# Patient Record
Sex: Female | Born: 1955 | Hispanic: Yes | Marital: Married | Smoking: Never smoker
Health system: Southern US, Community
[De-identification: ages and names within clinical notes are randomized; demographics above are authoritative.]

## PROBLEM LIST (undated history)

## (undated) DIAGNOSIS — Z9221 Personal history of antineoplastic chemotherapy: Secondary | ICD-10-CM

## (undated) DIAGNOSIS — N838 Other noninflammatory disorders of ovary, fallopian tube and broad ligament: Secondary | ICD-10-CM

## (undated) DIAGNOSIS — Z923 Personal history of irradiation: Secondary | ICD-10-CM

## (undated) DIAGNOSIS — M8580 Other specified disorders of bone density and structure, unspecified site: Secondary | ICD-10-CM

## (undated) DIAGNOSIS — Z170 Estrogen receptor positive status [ER+]: Secondary | ICD-10-CM

## (undated) DIAGNOSIS — C50412 Malignant neoplasm of upper-outer quadrant of left female breast: Secondary | ICD-10-CM

## (undated) HISTORY — PX: ABDOMINAL HYSTERECTOMY: SHX81

## (undated) HISTORY — DX: Other specified disorders of bone density and structure, unspecified site: M85.80

---

## 2008-03-16 ENCOUNTER — Other Ambulatory Visit: Admission: RE | Admit: 2008-03-16 | Discharge: 2008-03-16 | Payer: Self-pay | Admitting: Gynecology

## 2009-02-18 ENCOUNTER — Encounter: Admission: RE | Admit: 2009-02-18 | Discharge: 2009-02-18 | Payer: Self-pay | Admitting: Gynecology

## 2010-10-06 ENCOUNTER — Other Ambulatory Visit
Admission: RE | Admit: 2010-10-06 | Discharge: 2010-10-06 | Payer: Self-pay | Source: Home / Self Care | Admitting: Gynecology

## 2010-10-06 ENCOUNTER — Ambulatory Visit: Payer: Self-pay | Admitting: Gynecology

## 2010-10-17 ENCOUNTER — Ambulatory Visit: Payer: Self-pay | Admitting: Gynecology

## 2010-11-03 ENCOUNTER — Ambulatory Visit: Admission: RE | Admit: 2010-11-03 | Payer: Self-pay | Source: Home / Self Care | Admitting: Gynecology

## 2010-12-01 DIAGNOSIS — Z13820 Encounter for screening for osteoporosis: Secondary | ICD-10-CM

## 2010-12-15 ENCOUNTER — Other Ambulatory Visit (INDEPENDENT_AMBULATORY_CARE_PROVIDER_SITE_OTHER): Payer: BC Managed Care – PPO

## 2010-12-15 DIAGNOSIS — E559 Vitamin D deficiency, unspecified: Secondary | ICD-10-CM

## 2011-01-11 ENCOUNTER — Other Ambulatory Visit: Payer: BC Managed Care – PPO

## 2011-12-04 ENCOUNTER — Other Ambulatory Visit: Payer: Self-pay | Admitting: Gynecology

## 2011-12-04 DIAGNOSIS — Z1231 Encounter for screening mammogram for malignant neoplasm of breast: Secondary | ICD-10-CM

## 2011-12-11 ENCOUNTER — Ambulatory Visit
Admission: RE | Admit: 2011-12-11 | Discharge: 2011-12-11 | Disposition: A | Discharge status: Home / Self Care | Payer: BC Managed Care – PPO | Source: Ambulatory Visit | Attending: Gynecology | Admitting: Gynecology

## 2011-12-11 DIAGNOSIS — Z1231 Encounter for screening mammogram for malignant neoplasm of breast: Secondary | ICD-10-CM

## 2012-02-27 ENCOUNTER — Encounter: Payer: Self-pay | Admitting: Gynecology

## 2012-02-27 ENCOUNTER — Ambulatory Visit (INDEPENDENT_AMBULATORY_CARE_PROVIDER_SITE_OTHER): Payer: BC Managed Care – PPO | Admitting: Gynecology

## 2012-02-27 VITALS — BP 110/70 | Ht 59.5 in | Wt 142.0 lb

## 2012-02-27 DIAGNOSIS — Z01419 Encounter for gynecological examination (general) (routine) without abnormal findings: Secondary | ICD-10-CM

## 2012-02-27 DIAGNOSIS — M8580 Other specified disorders of bone density and structure, unspecified site: Secondary | ICD-10-CM

## 2012-02-27 DIAGNOSIS — M899 Disorder of bone, unspecified: Secondary | ICD-10-CM

## 2012-02-27 DIAGNOSIS — Z8639 Personal history of other endocrine, nutritional and metabolic disease: Secondary | ICD-10-CM

## 2012-02-27 DIAGNOSIS — Z833 Family history of diabetes mellitus: Secondary | ICD-10-CM

## 2012-02-27 DIAGNOSIS — R635 Abnormal weight gain: Secondary | ICD-10-CM

## 2012-02-27 DIAGNOSIS — Z87898 Personal history of other specified conditions: Secondary | ICD-10-CM

## 2012-02-27 LAB — CHOLESTEROL, TOTAL: Cholesterol: 223 mg/dL — ABNORMAL HIGH (ref 0–200)

## 2012-02-27 LAB — TSH: TSH: 1.128 u[IU]/mL (ref 0.350–4.500)

## 2012-02-27 NOTE — Patient Instructions (Signed)
Recuerdate de hacer la cita para la colonoscopia. Es [employer].  Colonoscopa (Colonoscopy)   El profesional que lo asiste le ha ordenado una colonoscopa. Es un examen para evaluar el colon en su totalidad. Durante este examen se limpia el colon. Luego se inserta un tubo largo de fibras pticas en el recto y el colon. El tubo de fibras pticas (fibroscopio, endoscopio) es un largo haz de fibras unidas y [place] flexibles. Estas fibras transmiten luz hacia la zona examinada y envan las imgenes para que el profesional las observe. Las molestias son mnimas. Podrn administrarle un sedante suave que lo ayudar a dormir antes y Teacher, adult education. Este examen tambin ayuda a Engineer, manufacturing bultos (tumores), plipos, inflamacin y reas de [place]. El profesional extraer una pequea pieza de tejido (biopsia) que ser examinada en el microscopio. INFORME AL PROFESIONAL SOBRE LOS SIGUIENTES PUNTOS:  Alergias.   Medicamentos que toma, incluyendo hierbas, gotas oftlmicas, medicamentos de venta libre y cremas.   Uso de esteroides (por va oral o cremas).   Problemas anteriores debido a anestsicos.   Posibilidad de embarazo, si correspondiera.   Antecedentes de haber tenido cogulos sanguneos (tromboflebitis) o antecedentes de hemorragias o problemas sanguneos.   Cirugas previas.   Otros problemas de [place].  ANTES DEL PROCEDIMIENTO  [facility] antes del estudio deber someterse a [place]   Consulte a su mdico si debe cambiar o suspender los medicamentos que toma habitualmente.   Le requerirn que se aplique enemas o tome un purgante.   Tendr que beber una gran cantidad de solucin electroltica durante un breve perodo de [place]. Esta solucin ayuda a limpiar el colon.   Deber presentarse 60 minutos antes del procedimiento a menos que el profesional que lo asiste le indique otra cosa.  DESPUS DEL PROCEDIMIENTO  Si le administraron un sedante o un analgsico, deber  solicitar a alguna persona que lo lleve hasta su casa.   En algunos casos se observar un pequeo cogulo la primera vez que haga la deposicin. NO debe preocuparse.  AVERIGUE LOS RESULTADOS DE SU ESTUDIO Durante su visita no contar con todos los [employer]. En este caso, tenga otra entrevista con su mdico para conocerlos. No piense que el resultado es normal si no tiene noticias de su mdico o de la institucin mdica. Es Copy seguimiento de todos los [place] de [place]. INSTRUCCIONES PARA EL CUIDADO DOMICILIARIO  No es infrecuente eliminar una cantidad moderada de gases y experimentar ligeros clicos abdominales luego del procedimiento. Esto se debe al aire que se le ha insuflado en el colon durante el examen. [place] o colquese una almohadilla trmica en el abdomen (vientre).   Podr retornar a sus comidas y actividades habituales despus que se le pase el efecto de los sedantes y los medicamentos.   Utilice los medicamentos de venta libre o de prescripcin para Chief Technology Officer, Environmental health practitioner o la [place], segn se lo indique el profesional que lo asiste. NO tome aspirina o anticoagulantes si le tomaron una biopsia. Consulte al profesional que lo asiste acerca del uso de medicamentos en caso que le hayan practicado una biopsia.  SOLICITE ATENCIN MDICA DE INMEDIATO SI:  Tiene fiebre.   Elimina grandes cogulos o elimina sangre luego del procedimiento. Esto puede sucederle hasta 10 a 14 das posteriores al procedimiento. Es ms probable que ocurra si le han practicado una biopsia.   Siente dolor abdominal que empeora y no puede aliviarse con los medicamentos.  Document Released: 07/26/2005 Document  Revised: 10/05/2011 ExitCare Patient Information 2012 [place], [place].                                                    Control del colesterol  Los niveles de colesterol en el organismo estn determinados significativamente por su dieta. Los niveles de colesterol  tambin se relacionan con la enfermedad cardaca. El material que sigue ayuda a Software engineer relacin y a Chiropractor qu puede hacer para mantener su corazn sano. No todo el colesterol es [place]. Las lipoprotenas de baja densidad (LDL) forman el colesterol "malo". El colesterol malo puede ocasionar depsitos de grasa que se acumulan en el interior de las arterias. Las lipoprotenas de alta densidad (HDL) es el colesterol "bueno". Ayuda a remover el colesterol LDL "malo" de la [place]. El colesterol es un factor de riesgo muy importante para la enfermedad cardaca. Otros factores de riesgo son la hipertensin arterial, el hbito de fumar, el estrs, la herencia y [place].  El msculo cardaco obtiene el suministro de sangre a travs de las arterias coronarias. Si su colesterol LDL ("malo") est elevado y el HDL ("bueno") es bajo, tiene un factor de riesgo para que se formen depsitos de [place] representative en las arterias coronarias (los vasos sanguneos que suministran sangre al corazn). Esto hace que haya menos lugar para que la sangre circule. Sin la suficiente sangre y oxgeno, el msculo cardaco no puede funcionar correctamente, y usted podr sentir dolores en el pecho (angina pectoris). Cuando una arteria coronaria se cierra completamente, una parte del msculo cardaco puede morir (infarto de miocardio). CONTROL DEL COLESTEROL Cuando el profesional que lo asiste enva la sangre al laboratorio para Artist nivel de colesterol, puede realizarle tambin un perfil completo de los lpidos. Con esta prueba, se puede determinar la cantidad total de colesterol, as como los niveles de LDL y HDL. Los triglicridos son un tipo de grasa que circula en la sangre y que tambin puede utilizarse para determinar el riesgo de enfermedad cardaca. En la siguiente tabla se establecen los nmeros ideales: Prueba: Colesterol total  Menos de 200 mg/dl.  Prueba: LDL "colesterol malo"  Menos de 100 mg/dl.   Menos de 70 mg/dl si tiene  riesgo muy elevado de sufrir un ataque cardaco o muerte cardaca sbita.  Prueba: HDL "colesterol bueno"  Mujeres: Ms de 50 mg/dl.   Hombres: Ms de 40 mg/dl.  Prueba: Trigliceridos  Menos de 150 mg/dl.  CONTROL DEL COLESTEROL CON DIETA Aunque factores como el ejercicio y el estilo de vida son importantes, la "primera lnea de ataque" es la dieta. Esto se debe a que se sabe que ciertos alimentos hacen subir el colesterol y otros lo [place]. El objetivo debe ser [employer] alimentos, de modo que tengan un efecto sobre el colesterol y, an ms importante, Microbiologist las grasas saturadas y trans con otros tipos de grasas, como las monoinsaturadas y las poliinsaturadas y cidos grasos omega-3 . En promedio, una persona no debe consumir ms de 15 a 17 g de grasas saturadas por [employer]. Las grasas saturadas y trans se consideran grasas "malas", ya que elevan el colesterol LDL. Las grasas saturadas se encuentran principalmente en productos animales como carne, [place] y crema. Pero esto no significa que usted Marketing executive todas sus comidas favoritas. Actualmente, como lo muestra el cuadro que figura al final de [place] documento,  hay sustitutos de buen sabor, bajos en grasas y en colesterol, para la mayora de los alimentos que a usted Musician. Elija aquellos alimentos alternativos que sean bajos en grasas o sin grasas. Elija cortes de carne del cuarto trasero o lomo ya que estos cortes son los que tienen menor cantidad de grasa y Oncologist. El pollo (sin piel), el pescado, la carne de ternera, y la [place] de [place] molida son excelentes opciones. Elimine las carnes [employer] o el salami. Los [employer] o nada de grasas saturadas. Cuando consuma carne [place], carne de aves de corral, o pescado, hgalo en porciones de 85 gramos (3 onzas). Las grasas trans tambin se llaman "aceites parcialmente hidrogenados". Son aceites manipulados cientficamente de [place] que son slidos a  Publishing rights manager, tienen una larga vida y Glass blower/designer sabor y la textura de los alimentos a los que se Scientist, clinical (histocompatibility and immunogenetics). Las grasas trans se encuentran en la [place], [place], crackers y alimentos horneados.  Para hornear y cocinar, el aceite es un excelente sustituto para la [place]. Los aceites monoinsaturados tienen un beneficio particular, ya que se cree que disminuyen el colesterol LDL (colesterol malo) y elevan el HDL. Deber evitar los aceites tropicales saturados como el de coco y el de [place].  Recuerde, adems, que puede comer sin restricciones los grupos de alimentos que son naturalmente libres de grasas saturadas y Neurosurgeon trans, entre los que se incluyen el pescado, las frutas (excepto el [place]), verduras, frijoles, cereales (cebada, arroz, [place], trigo) y las pastas (sin salsas con crema)  IDENTIFIQUE LOS ALIMENTOS QUE DISMINUYEN EL COLESTEROL  Pueden disminuir el colesterol las fibras solubles que estn en las frutas, como las [place], en los vegetales como el brcoli, las patatas y las zanahorias; en las legumbres como frijoles, guisantes y Therapist, occupational; y en los cereales como la cebada. Los alimentos fortificados con fitosteroles tambin Engineer, production. Debe consumir al menos 2 g de estos alimentos a diario para Financial planner de disminucin de [place].  En el supermercado, lea las etiquetas de los envases para identificar los alimentos bajos en grasas saturadas, libres de grasas trans y bajos en [place], . Elija quesos que tengan solo de 2 a 3 g de grasa saturada por onza (28,35 g). Use una margarina que no dae el corazn, [place] de grasas trans o aceite parcialmente hidrogenado. Al comprar alimentos horneados (galletitas dulces y [place]) evite el aceite parcialmente hidrogenado. Los panes y bollos debern ser de granos enteros (harina de maz o de avena entera, en lugar de "harina" o "harina enriquecida"). Compre sopas en lata que no sean cremosas, con bajo contenido de  sal y sin grasas adicionadas.  TCNICAS DE PREPARACIN DE LOS ALIMENTOS  Nunca fra los alimentos en aceite abundante. Si debe frer, hgalo en poco aceite y removiendo [place], porque as se utilizan muy pocas grasas, o utilice un spray antiadherente. Cuando le sea posible, hierva, hornee o ase las carnes y cocine los vegetales al vapor. En vez de [employer] con mantequilla o [place], utilice limn y hierbas, pur de Psychologist, educational y canela (para las calabazas y batatas), yogurt y salsa descremados y aderezos para ensaladas bajos en contenido graso.  BAJO EN GRASAS SATURADAS / SUSTITUTOS BAJOS EN GRASA  Carnes / Grasas saturadas (g)  Evite: Bife, corte graso (3 oz/85 g) / 11 g   Elija: Bife, corte magro (3 oz/85 g) / 4 g   Evite: Hamburguesa (3 oz/85 g) / 7 g  Elija:  [place] magra (3 oz/85 g) / 5 g   Evite: Jamn (3 oz/85 g) / 6 g   Elija:  Jamn magro (3 oz/85 g) / 2.4 g   Evite: Pollo, con piel (3 oz/85 g), Carne oscura / 4 g   Elija:  Pollo, sin piel (3 oz/85 g), Carne oscura / 2 g   Evite: Pollo, con piel (3 oz/85 g), Carne magra / 2.5 g   Elija: Pollo, sin piel (3 oz/85 g), Carne magra / 1 g  Lcteos / Grasas saturadas (g)  Evite: Leche entera (1 taza) / 5 g   Elija: Leche con bajo contenido de grasa, 2% (1 taza) / 3 g   Elija: Leche con bajo contenido de grasa, 1% (1 taza) / 1.5 g   Elija: Leche descremada (1 taza) / 0.3 g   Evite: Queso duro (1 oz/28 g) / 6 g   Elija: Queso descremado (1 oz/28 g) / 2-3 g   Evite: Queso cottage, 4% grasa (1 taza)/ 6.5 g   Elija: Queso cottage con bajo contenido de grasa, 1% grasa (1 taza)/ 1.5 g   Evite: Helado (1 taza) / 9 g   Elija: Sorbete (1 taza) / 2.5 g   Elija: Yogurt helado sin contenido de grasa (1 taza) / 0.3 g   Elija: Barras de fruta congeladas / vestigios   Evite: Crema batida (1 cucharada) / 3.5 g   Elija: Batidos glac sin lcteos (1 cucharada) / 1 g  Condimentos / Grasas saturadas  (g)  Evite: Mayonesa (1 cucharada) / 2 g   Elija: Mayonesa con bajo contenido de grasa (1 cucharada) / 1 g   Evite: [place] (1 cucharada) / 7 g   Elija: Margarina extra light (1 cucharada) / 1 g   Evite: Aceite de coco (1 cucharada) / 11.8 g   Elija: Aceite de oliva (1 cucharada) / 1.8 g   Elija: Aceite de maz (1 cucharada) / 1.7 g   Elija: Aceite de crtamo (1 cucharada) / 1.2 g   Elija: Aceite de girasol (1 cucharada) / 1.4 g   Elija: Aceite de soja (1 cucharada) / 2.4 g   Elija: Aceite de canola (1 cucharada) / 1 g  Document Released: 10/16/2005 Document Revised: 06/28/2011 [facility] Patient Information 2012 [place], [place].  Ejercicios para perder peso (Exercise to Lose Weight) La actividad fsica y [place] Dear dieta saludable ayudan a perder peso. El mdico podr sugerirle ejercicios especficos. IDEAS Y CONSEJOS PARA HACER EJERCICIOS  Elija opciones econmicas que disfrute hacer , como caminar, andar en bicicleta o los vdeos para ejercitarse.   Utilice las Microbiologist del ascensor.   Camine durante la hora del almuerzo.   Estacione el auto lejos del lugar de [place] o [place].   Concurra a un gimnasio o tome clases de gimnasia.   Comience con 5  10 minutos de actividad fsica por da. Ejercite hasta 30 minutos, 4 a 6 das por [address]   Utilice zapatos que tengan un buen soporte y ropas cmodas.   Elongue antes y despus de Company secretary.   Ejercite hasta que aumente la respiracin y el corazn palpite rpido.   Beba agua extra cuando ejercite.   No haga ejercicio Firefighter, sentirse mareado o que le falte mucho el aire.  La actividad fsica puede quemar alrededor de 150 caloras.  Correr 20 cuadras en 15 minutos.   Jugar vley durante 45 a 60 minutos.   Limpiar y encerar el auto durante 45 a  60 minutos.   Jugar ftbol americano de toque.   Caminar 25 cuadras en 35 minutos.   Empujar un cochecito 20 cuadras en 30 minutos.   Jugar baloncesto  durante 30 minutos.   Rastrillar hojas secas durante 30 minutos.   Andar en bicicleta 80 cuadras en 30 minutos.   Caminar 30 cuadras en 30 minutos.   Bailar durante 30 minutos.   Quitar la nieve con una pala durante 15 minutos.   Nadar vigorosamente durante 20 minutos.   Subir escaleras durante 15 minutos.   Andar en bicicleta 60 cuadras durante 15 minutos.   Arreglar el jardn entre 30 y 45 minutos.   Saltar a la soga durante 15 minutos.   Limpiar vidrios o pisos durante 45 a 60 minutos.  Document Released: 01/20/2011 Document Revised: 06/28/2011 [facility] Patient Information 2012 [place], [place].

## 2012-02-27 NOTE — Progress Notes (Signed)
Heather [name] [birth year] [id number]   History:    56 y.o.  for annual exam with no complaints. Review of her record indicated she's [place] abdominal [place]. Her last mammogram was in favor 2013 which was normal and she frequently does her self breast examination. She had a bone density study in 2012 with decreased bone mineralization noted at the AP spine with a T score of -2.0. Last year she had vitamin D deficiency and she's currently taking calcium vitamin D daily and is active. She has had not had a colonoscopy.  Past medical history,surgical history, family history and social history were all reviewed and documented in the EPIC chart.  Gynecologic History No LMP recorded. Patient has had a hysterectomy. Contraception: none Last Pap: 2012. Results were: normal Last mammogram: 2013. Results were: normal  Obstetric History OB History    Grav Para Term Preterm Abortions TAB SAB Ect Mult Living   2 2 2       2      # Outc Date GA Lbr Len/2nd Wgt Sex Del Anes PTL Lv   1 TRM     F SVD  No Yes   2 TRM     F CS  No Yes       ROS:  Was performed and pertinent positives and negatives are included in the history.  Exam: chaperone present  BP 110/70  Ht 4' 11.5" (1.511 m)  Wt 142 lb (64.411 kg)  BMI 28.20 kg/m2  Body mass index is 28.20 kg/(m^2).  General appearance : Well developed well nourished female. No acute distress HEENT: Neck supple, trachea midline, no carotid bruits, no thyroidmegaly Lungs: Clear to auscultation, no rhonchi or wheezes, or rib retractions  Heart: Regular rate and rhythm, no murmurs or gallops Breast:Examined in sitting and supine position were symmetrical in appearance, no palpable masses or tenderness,  no skin retraction, no nipple inversion, no nipple discharge, no skin discoloration, no axillary or supraclavicular lymphadenopathy Abdomen: no palpable masses or tenderness, no rebound or guarding Extremities: no edema  or skin discoloration or tenderness  Pelvic:  [place], Urethra, Skene Glands: Within normal limits             Vagina: No gross lesions or discharge  Cervix: Absent Uterus absent  Adnexa  Without masses or tenderness  Anus and perineum  normal   Rectovaginal  normal sphincter tone without palpated masses or tenderness             Hemoccult cards presented to the patient to submit to the office for testing. Digital rectal exam unremarkable.     Assessment/Plan:  56 y.o. female for annual exam once again reminded to have her screening colonoscopy. And then the gastroenterologist provided. She was instructed to continue her monthly self breast examination. She will submit to the office the Hemoccult card system it to the office for testing. The following labs will be drawn today: CBC, cholesterol, hemoglobin A1c (brother with history of non-insulin-dependent diabetes), TSH, along with a urinalysis. New Pap smear screening guidelines discussed she has no prior history of abnormal Pap smears and has had a hysterectomy so she will no longer needs Pap smears. All the above was discussed with this patient in Spanish all questions were answered we'll follow accordingly. Of note literature information on exercise and cholesterol lowering diet was provided.   Ok Edwards MD, 4:46 PM 02/27/2012

## 2012-02-28 ENCOUNTER — Other Ambulatory Visit: Payer: Self-pay | Admitting: *Deleted

## 2012-02-28 DIAGNOSIS — E7800 Pure hypercholesterolemia, unspecified: Secondary | ICD-10-CM

## 2012-02-28 LAB — URINALYSIS W MICROSCOPIC + REFLEX CULTURE
Bacteria, UA: NONE SEEN
Bilirubin Urine: NEGATIVE
Casts: NONE SEEN
Crystals: NONE SEEN
Glucose, UA: NEGATIVE mg/dL
Hgb urine dipstick: NEGATIVE
Ketones, ur: NEGATIVE mg/dL
Leukocytes, UA: NEGATIVE
Nitrite: NEGATIVE
Protein, ur: NEGATIVE mg/dL
Specific Gravity, Urine: 1.005 (ref 1.005–1.030)
Squamous Epithelial / LPF: NONE SEEN
Urobilinogen, UA: 0.2 mg/dL (ref 0.0–1.0)
pH: 7 (ref 5.0–8.0)

## 2012-02-28 LAB — CBC WITH DIFFERENTIAL/PLATELET
Basophils Absolute: 0 10*3/uL (ref 0.0–0.1)
Basophils Relative: 0 % (ref 0–1)
Eosinophils Absolute: 0 10*3/uL (ref 0.0–0.7)
Eosinophils Relative: 1 % (ref 0–5)
HCT: 41.9 % (ref 36.0–46.0)
Hemoglobin: 14.5 g/dL (ref 12.0–15.0)
Lymphocytes Relative: 34 % (ref 12–46)
Lymphs Abs: 2 10*3/uL (ref 0.7–4.0)
MCH: 30.5 pg (ref 26.0–34.0)
MCHC: 34.6 g/dL (ref 30.0–36.0)
MCV: 88 fL (ref 78.0–100.0)
Monocytes Absolute: 0.4 10*3/uL (ref 0.1–1.0)
Monocytes Relative: 6 % (ref 3–12)
Neutro Abs: 3.5 10*3/uL (ref 1.7–7.7)
Neutrophils Relative %: 59 % (ref 43–77)
Platelets: 231 10*3/uL (ref 150–400)
RBC: 4.76 MIL/uL (ref 3.87–5.11)
RDW: 13.5 % (ref 11.5–15.5)
WBC: 5.9 10*3/uL (ref 4.0–10.5)

## 2012-02-28 LAB — HEMOGLOBIN A1C
Hgb A1c MFr Bld: 5.6 % (ref <5.7)
Mean Plasma Glucose: 114 mg/dL (ref <117)

## 2012-02-28 LAB — VITAMIN D 25 HYDROXY (VIT D DEFICIENCY, FRACTURES): Vit D, 25-Hydroxy: 42 ng/mL (ref 30–89)

## 2012-03-07 ENCOUNTER — Other Ambulatory Visit: Payer: BC Managed Care – PPO

## 2012-03-07 DIAGNOSIS — E7800 Pure hypercholesterolemia, unspecified: Secondary | ICD-10-CM

## 2012-03-07 LAB — LIPID PANEL
Cholesterol: 209 mg/dL — ABNORMAL HIGH (ref 0–200)
HDL: 50 mg/dL (ref >39)
LDL Cholesterol: 141 mg/dL — ABNORMAL HIGH (ref 0–99)
Total CHOL/HDL Ratio: 4.2 Ratio
Triglycerides: 89 mg/dL (ref <150)
VLDL: 18 mg/dL (ref 0–40)

## 2012-03-11 ENCOUNTER — Other Ambulatory Visit: Payer: Self-pay | Admitting: *Deleted

## 2012-03-11 DIAGNOSIS — E7800 Pure hypercholesterolemia, unspecified: Secondary | ICD-10-CM

## 2012-03-12 ENCOUNTER — Other Ambulatory Visit: Payer: Self-pay | Admitting: *Deleted

## 2012-03-12 ENCOUNTER — Other Ambulatory Visit: Payer: Self-pay | Admitting: Gynecology

## 2012-03-12 DIAGNOSIS — Z1211 Encounter for screening for malignant neoplasm of colon: Secondary | ICD-10-CM

## 2013-04-14 ENCOUNTER — Encounter: Payer: Self-pay | Admitting: Gynecology

## 2013-04-14 ENCOUNTER — Ambulatory Visit (INDEPENDENT_AMBULATORY_CARE_PROVIDER_SITE_OTHER): Payer: BC Managed Care – PPO | Admitting: Gynecology

## 2013-04-14 VITALS — BP 118/76 | Ht 59.75 in | Wt 139.0 lb

## 2013-04-14 DIAGNOSIS — Z1159 Encounter for screening for other viral diseases: Secondary | ICD-10-CM

## 2013-04-14 DIAGNOSIS — Z01419 Encounter for gynecological examination (general) (routine) without abnormal findings: Secondary | ICD-10-CM

## 2013-04-14 DIAGNOSIS — M899 Disorder of bone, unspecified: Secondary | ICD-10-CM

## 2013-04-14 DIAGNOSIS — N951 Menopausal and female climacteric states: Secondary | ICD-10-CM

## 2013-04-14 DIAGNOSIS — M8580 Other specified disorders of bone density and structure, unspecified site: Secondary | ICD-10-CM

## 2013-04-14 DIAGNOSIS — Z8639 Personal history of other endocrine, nutritional and metabolic disease: Secondary | ICD-10-CM

## 2013-04-14 LAB — CHOLESTEROL, TOTAL: Cholesterol: 186 mg/dL (ref 0–200)

## 2013-04-14 LAB — HEPATITIS C ANTIBODY: HCV Ab: NEGATIVE

## 2013-04-14 NOTE — Patient Instructions (Addendum)
Colonoscopa (Colonoscopy) El profesional que lo asiste le ha ordenado una colonoscopa. Es un examen para evaluar el colon en su totalidad. Durante este examen se limpia el colon. Luego se inserta un tubo largo de fibras pticas en el recto y el colon. El tubo de fibras pticas (fibroscopio, endoscopio) es un largo haz de fibras unidas y [place] flexibles. Estas fibras transmiten luz hacia la zona examinada y envan las imgenes para que el profesional las observe. Las molestias son mnimas. Podrn administrarle un sedante suave que lo ayudar a dormir antes y Teacher, adult education. Este examen tambin ayuda a Engineer, manufacturing bultos (tumores), plipos, inflamacin y reas de [place]. El profesional extraer una pequea pieza de tejido (biopsia) que ser examinada en el microscopio. INFORME AL PROFESIONAL SOBRE LOS SIGUIENTES PUNTOS:  Alergias.  Medicamentos que toma, incluyendo hierbas, gotas oftlmicas, medicamentos de venta libre y cremas.  Uso de esteroides (por va oral o cremas).  Problemas anteriores debido a anestsicos.  Posibilidad de embarazo, si correspondiera.  Antecedentes de haber tenido cogulos sanguneos (tromboflebitis) o antecedentes de hemorragias o problemas sanguneos.  Cirugas previas.  Otros problemas de [place]. ANTES DEL PROCEDIMIENTO  [facility] antes del estudio deber someterse a [place]  Consulte a su mdico si debe cambiar o suspender los medicamentos que toma habitualmente.  Le requerirn que se aplique enemas o tome un purgante.  Tendr que beber una gran cantidad de solucin electroltica durante un breve perodo de [place]. Esta solucin ayuda a limpiar el colon.  Deber presentarse 60 minutos antes del procedimiento a menos que el profesional que lo asiste le indique otra cosa. DESPUS DEL PROCEDIMIENTO  Si le administraron un sedante o un analgsico, deber solicitar a alguna persona que lo lleve hasta su casa.  En algunos casos se observar un  pequeo cogulo la primera vez que haga la deposicin. NO debe preocuparse. AVERIGUE LOS RESULTADOS DE SU ESTUDIO Durante su visita no contar con todos los [employer]. En este caso, tenga otra entrevista con su mdico para conocerlos. No piense que el resultado es normal si no tiene noticias de su mdico o de la institucin mdica. Es Copy seguimiento de todos los [place] de [place]. INSTRUCCIONES PARA EL CUIDADO DOMICILIARIO  No es infrecuente eliminar una cantidad moderada de gases y experimentar ligeros clicos abdominales luego del procedimiento. Esto se debe al aire que se le ha insuflado en el colon durante el examen. Camine o colquese una almohadilla trmica en el abdomen (vientre).  Podr retornar a sus comidas y actividades habituales despus que se le pase el efecto de los sedantes y los medicamentos.  Utilice los medicamentos de venta libre o de prescripcin para Chief Technology Officer, Environmental health practitioner o la [place], segn se lo indique el profesional que lo asiste. NO tome aspirina o anticoagulantes si le tomaron una biopsia. Consulte al profesional que lo asiste acerca del uso de medicamentos en caso que le hayan practicado una biopsia. SOLICITE ATENCIN MDICA DE INMEDIATO SI:  Tiene fiebre.  Elimina grandes cogulos o elimina sangre luego del procedimiento. Esto puede sucederle hasta 10 a 14 das posteriores al procedimiento. Es ms probable que ocurra si le han practicado una biopsia.  Siente dolor abdominal que empeora y no puede aliviarse con los medicamentos. Document Released: 07/26/2005 Document Revised: 01/08/2012 [facility] Patient Information 2014 [place], [place]. Hep c screen

## 2013-04-14 NOTE — Progress Notes (Signed)
Heather [name] 28-Nov-1955 [id number]   History:    57 y.o.  for annual gyn exam no complaints today. As of this date patient has not had her colonoscopy. Her last bone density study was in 2012 which demonstrated her lowest T score was the AP spine with a value of -2.0. Patient has had history in the past and vitamin D deficiency. Patient is currently taking calcium and vitamin D. Patient mammogram was in 2013 reports be normal. Patient with no prior history of abnormal Pap smears. Her recent labs were drawn by her PCP. Patient has never been on hormone replacement therapy and has never suffered for many vasomotor symptoms. Her Hemoccult blood testing 2013 was normal.  Past medical history,surgical history, family history and social history were all reviewed and documented in the EPIC chart.  Gynecologic History No LMP recorded. Patient has had a hysterectomy. Contraception: post menopausal status Last Pap: 2011. Results were: normal Last mammogram: 2013. Results were: normal  Obstetric History OB History   Grav Para Term Preterm Abortions TAB SAB Ect Mult Living   2 2 2       2      # Outc Date GA Lbr Len/2nd Wgt Sex Del Anes PTL Lv   1 TRM     F SVD  No Yes   2 TRM     F CS  No Yes       ROS: A ROS was performed and pertinent positives and negatives are included in the history.  GENERAL: No fevers or chills. HEENT: No change in vision, no earache, sore throat or sinus congestion. NECK: No pain or stiffness. CARDIOVASCULAR: No chest pain or pressure. No palpitations. PULMONARY: No shortness of breath, cough or wheeze. GASTROINTESTINAL: No abdominal pain, nausea, vomiting or diarrhea, melena or bright red blood per rectum. GENITOURINARY: No urinary frequency, urgency, hesitancy or dysuria. MUSCULOSKELETAL: No joint or muscle pain, no back pain, no recent trauma. DERMATOLOGIC: No rash, no itching, no lesions. ENDOCRINE: No polyuria, polydipsia, no heat or cold intolerance. No recent change in  weight. HEMATOLOGICAL: No anemia or easy bruising or bleeding. NEUROLOGIC: No headache, seizures, numbness, tingling or weakness. PSYCHIATRIC: No depression, no loss of interest in normal activity or change in sleep pattern.     Exam: chaperone present  BP 118/76  Ht 4' 11.75" (1.518 m)  Wt 139 lb (63.05 kg)  BMI 27.36 kg/m2  Body mass index is 27.36 kg/(m^2).  General appearance : Well developed well nourished female. No acute distress HEENT: Neck supple, trachea midline, no carotid bruits, no thyroidmegaly Lungs: Clear to auscultation, no rhonchi or wheezes, or rib retractions  Heart: Regular rate and rhythm, no murmurs or gallops Breast:Examined in sitting and supine position were symmetrical in appearance, no palpable masses or tenderness,  no skin retraction, no nipple inversion, no nipple discharge, no skin discoloration, no axillary or supraclavicular lymphadenopathy Abdomen: no palpable masses or tenderness, no rebound or guarding Extremities: no edema or skin discoloration or tenderness  Pelvic:  Bartholin, Urethra, Skene Glands: Within normal limits             Vagina: No gross lesions or discharge  Cervix: absent  Uterus  Absent  Adnexa  Without masses or tenderness  Anus and perineum  normal   Rectovaginal  normal sphincter tone without palpated masses or tenderness             Hemoccult card provided   New CDC guidelines is recommending patients be tested once in her  lifetime for hepatitis C antibody who were born between [birth year] through 1964. This was discussed with the patient today and has agreed to be tested today.  Assessment/Plan:  57 y.o. female for annual exam was counseled once again on the importance of her screening colonoscopy. She is to submit to the office the vocal cords for testing. A screening cholesterol, vitamin D and hepatitis C were obtained today. No Pap smear was done today. The Lungs were discussed. Patient was scheduled for bone density study as  well as her mammogram. All the above instructions were provided in Spanish.    Ok Edwards MD, 1:50 PM 04/14/2013

## 2013-04-15 LAB — VITAMIN D 25 HYDROXY (VIT D DEFICIENCY, FRACTURES): Vit D, 25-Hydroxy: 38 ng/mL (ref 30–89)

## 2013-04-22 ENCOUNTER — Encounter: Payer: Self-pay | Admitting: Gynecology

## 2013-04-24 ENCOUNTER — Encounter: Payer: Self-pay | Admitting: Gynecology

## 2013-05-21 ENCOUNTER — Other Ambulatory Visit: Payer: Self-pay | Admitting: Anesthesiology

## 2013-05-21 DIAGNOSIS — Z1211 Encounter for screening for malignant neoplasm of colon: Secondary | ICD-10-CM

## 2014-01-08 ENCOUNTER — Other Ambulatory Visit: Payer: Self-pay

## 2014-01-08 DIAGNOSIS — Z1231 Encounter for screening mammogram for malignant neoplasm of breast: Secondary | ICD-10-CM

## 2014-01-22 ENCOUNTER — Ambulatory Visit
Admission: RE | Admit: 2014-01-22 | Discharge: 2014-01-22 | Disposition: A | Discharge status: Home / Self Care | Payer: Self-pay | Source: Ambulatory Visit

## 2014-01-22 DIAGNOSIS — Z1231 Encounter for screening mammogram for malignant neoplasm of breast: Secondary | ICD-10-CM

## 2014-01-23 ENCOUNTER — Other Ambulatory Visit: Payer: Self-pay | Admitting: Gynecology

## 2014-01-23 DIAGNOSIS — R928 Other abnormal and inconclusive findings on diagnostic imaging of breast: Secondary | ICD-10-CM

## 2014-02-09 ENCOUNTER — Ambulatory Visit: Payer: BC Managed Care – PPO

## 2014-02-19 ENCOUNTER — Encounter (INDEPENDENT_AMBULATORY_CARE_PROVIDER_SITE_OTHER): Payer: Self-pay

## 2014-02-19 ENCOUNTER — Ambulatory Visit
Admission: RE | Admit: 2014-02-19 | Discharge: 2014-02-19 | Disposition: A | Discharge status: Home / Self Care | Payer: BC Managed Care – PPO | Source: Ambulatory Visit | Attending: Gynecology | Admitting: Gynecology

## 2014-02-19 DIAGNOSIS — R928 Other abnormal and inconclusive findings on diagnostic imaging of breast: Secondary | ICD-10-CM

## 2014-05-12 ENCOUNTER — Encounter: Payer: Self-pay | Admitting: Gynecology

## 2014-05-18 ENCOUNTER — Encounter: Payer: Self-pay | Admitting: Gynecology

## 2014-08-31 ENCOUNTER — Encounter: Payer: Self-pay | Admitting: Gynecology

## 2015-01-13 ENCOUNTER — Ambulatory Visit (INDEPENDENT_AMBULATORY_CARE_PROVIDER_SITE_OTHER): Payer: BLUE CROSS/BLUE SHIELD | Admitting: Gynecology

## 2015-01-13 ENCOUNTER — Encounter: Payer: Self-pay | Admitting: Gynecology

## 2015-01-13 VITALS — BP 130/80 | Ht 60.0 in | Wt 141.0 lb

## 2015-01-13 DIAGNOSIS — Z01419 Encounter for gynecological examination (general) (routine) without abnormal findings: Secondary | ICD-10-CM

## 2015-01-13 DIAGNOSIS — Z780 Asymptomatic menopausal state: Secondary | ICD-10-CM | POA: Diagnosis not present

## 2015-01-13 DIAGNOSIS — M8580 Other specified disorders of bone density and structure, unspecified site: Secondary | ICD-10-CM | POA: Diagnosis not present

## 2015-01-13 DIAGNOSIS — Z8639 Personal history of other endocrine, nutritional and metabolic disease: Secondary | ICD-10-CM

## 2015-01-13 NOTE — Patient Instructions (Signed)
Osteoporosis  (Osteoporosis)  A lo largo de la vida, el cuerpo elimina el tejido viejo de los [place] y lo reemplaza por tejido nuevo. A medida que se envejece, el cuerpo no puede reponerlo tan rpidamente como lo elimina. [employer] 30 aos, la mayora de las personas comienza a perder poco a poco el tejido de los huesos debido al desequilibrio entre la prdida y la reposicin. Algunas personas pierden ms tejido [employer]. La prdida del tejido del hueso ms all de un grado normal se considera osteoporosis.  La osteoporosis afecta la resistencia y la durabilidad de los [place]. El interior de los extremos de los huesos y los huesos planos, como los huesos de la pelvis, se parecen a un panal porque estn llenos de pequeos espacios abiertos. Al perder tejido los huesos se vuelven menos densos. Esto significa que los espacios abiertos se hacen ms grandes y las paredes entre estos espacios se vuelven ms delgadas. Como consecuencia, los huesos se vuelven ms dbiles. Los huesos de una persona que sufre osteoporosis llegan a ser tan dbiles que pueden romperse (fractura) en un accidente leve, como una simple cada.  CAUSAS  Los siguientes factores han sido asociados con el desarrollo de la osteoporosis.   El hbito de fumar.  Beber ms de 2 medidas de bebidas [employer] a la [place].  Uso de ciertos medicamentos durante un tiempo prolongado.  Corticoides.  Medicamentos para la quimioterapia.  Medicamentos para la tiroides.  Medicamentos antiepilpticos.  Medicamentos de supresin gonadal.  Medicamentos inmunosupresores.  Tener bajo peso.  Falta de actividad fsica.  Falta de exposicin al sol. Esto puede ser la causa del dficit de vitamina D.  Ciertas enfermedades crnicas.  Ciertas enfermedades inflamatorias del intestino, como la enfermedad de Crohn y la colitis ulcerosa.  Diabetes.  Hipertiroidismo.  Hiperparatiroidismo. [place] desarrollar osteoporosis. Sin embargo, los siguientes factores pueden aumentar el riesgo de Actor osteoporosis.   [place]nero - La mujeres tienen ms [employer].  Edad - Tener ms de 9 aos aumenta el riesgo.  [place] y asitica tienen ms riesgo.  El peso - Tener un peso extremadamente bajo puede aumentar el riesgo de osteoporosis.  Historia familiar de osteoporosis - Tener un miembro en la familia que haya sufrido osteoporosis hace que aumente el [place] que sufren osteoporosis no tienen sntomas.  DIAGNSTICO  Algunos signos hallados durante un examen fsico que pueden hacer sospechar al mdico que sufre osteoporosis son:   Disminucin de Agricultural consultant. La causa en general es la compresin de los huesos que forman la columna vertebral (vrtebras), que se han debilitado y se han fracturado.  Una curva o redondeo de la espalda (cifosis). Para confirmar los signos de osteoporosis, el mdico puede solicitar un procedimiento que [place] 2 haces de rayos X en dosis bajas, con diferentes niveles de energa para medir la densidad mineral sea (absorciometra de rayos X de energa dual [DXA]). Adems, el mdico controlar su nivel de vitamina D.  TRATAMIENTO  El objetivo del tratamiento de la osteoporosis es el fortalecimiento de los huesos con el fin de disminuir el riesgo de fracturas. Hay diferentes tipos de medicamentos disponibles para ayudar a Geneticist, molecular. Algunos de estos medicamentos actan reduciendo [employer] de prdida sea. Otros medicamentos funcionan aumentando la densidad sea. El tratamiento tambin consiste en asegurarse de que sus niveles de calcio y vitamina D son adecuados.  PREVENCIN  Hay cosas que usted puede hacer para prevenir la osteoporosis. Un consumo adecuado de calcio y vitamina D puede ayudar a Scientist, forensic una ptima densidad mineral sea. El ejercicio regular tambin puede  ayudar, sobre todo la resistencia y actividades de alto impacto. Si usted fuma, dejar de fumar es una parte importante de la prevencin de la osteoporosis.  ASEGRESE DE QUE:   Comprende estas instrucciones.  Controlar su enfermedad.  Solicitar ayuda de inmediato si no mejora o si empeora. PARA OBTENER MS INFORMACIN  www.osteo.org and [email or web address].com.ee  Document Released: 07/26/2005 Document Revised: 02/10/2013 [facility] Patient Information 2015 [place], [place]. This information is not intended to replace advice given to you by your health care provider. Make sure you discuss any questions you have with your health care provider.

## 2015-01-13 NOTE — Progress Notes (Signed)
Heather [name] 1956-08-04 [id number]   History:    60 y.o.  for annual gyn exam who has not been seen the office since 2014. In 2014 Dr. Collene Mares did a colonoscopy were by benign polyps were removed. Review of her record indicated her last bone density study was in 2012 the lowest T score was at the AP spine with a value of -2.0. 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 Patient does have past history vitamin D deficiency. She states she is currently taking calcium and vitamin D. Patient prior to her hysterectomy reports no abnormal Pap smears. Dr. Karle Starch is her PCP who is been doing her blood work. She has never been on any hormone replacement therapy and is currently suffering from no vasomotor symptoms.  Past medical history,surgical history, family history and social history were all reviewed and documented in the EPIC chart.  Gynecologic History No LMP recorded. Patient has had a hysterectomy. Contraception: status post hysterectomy Last Pap: 2011. Results were: normal Last mammogram: 2015. Results were: normal  Obstetric History OB History  Gravida Para Term Preterm AB SAB TAB Ectopic Multiple Living  2 2 2       2     # Outcome Date GA Lbr Len/2nd Weight Sex Delivery Anes PTL Lv  2 Term     F CS-Unspec  N Y  1 Term     F Vag-Spont  N Y       ROS: A ROS was performed and pertinent positives and negatives are included in the history.  GENERAL: No fevers or chills. HEENT: No change in vision, no earache, sore throat or sinus congestion. NECK: No pain or stiffness. CARDIOVASCULAR: No chest pain or pressure. No palpitations. PULMONARY: No shortness of breath, cough or wheeze. GASTROINTESTINAL: No abdominal pain, nausea, vomiting or diarrhea, melena or bright red blood per rectum. GENITOURINARY: No urinary frequency, urgency, hesitancy or dysuria. MUSCULOSKELETAL: No joint or muscle pain, no back pain, no recent  trauma. DERMATOLOGIC: No rash, no itching, no lesions. ENDOCRINE: No polyuria, polydipsia, no heat or cold intolerance. No recent change in weight. HEMATOLOGICAL: No anemia or easy bruising or bleeding. NEUROLOGIC: No headache, seizures, numbness, tingling or weakness. PSYCHIATRIC: No depression, no loss of interest in normal activity or change in sleep pattern.     Exam: chaperone present  BP 130/80 mmHg  Ht 5' (1.524 m)  Wt 141 lb (63.957 kg)  BMI 27.54 kg/m2  Body mass index is 27.54 kg/(m^2).  General appearance : Well developed well nourished female. No acute distress HEENT: Eyes: no retinal hemorrhage or exudates,  Neck supple, trachea midline, no carotid bruits, no thyroidmegaly Lungs: Clear to auscultation, no rhonchi or wheezes, or rib retractions  Heart: Regular rate and rhythm, no murmurs or gallops Breast:Examined in sitting and supine position were symmetrical in appearance, no palpable masses or tenderness,  no skin retraction, no nipple inversion, no nipple discharge, no skin discoloration, no axillary or supraclavicular lymphadenopathy Abdomen: no palpable masses or tenderness, no rebound or guarding Extremities: no edema or skin discoloration or tenderness  Pelvic:  Bartholin, Urethra, Skene Glands: Within normal limits             Vagina: No gross lesions or discharge  Cervix: Absent  Uterus  absent  Adnexa  Without masses or tenderness  Anus and perineum  normal   Rectovaginal  normal sphincter tone without palpated masses or tenderness  Hemoccult PCP will provide     Assessment/Plan:  59 y.o. female for annual exam who is due for bone density study. She was provided with a requisition to schedule here in the office in the next few weeks. Patient was reminded once again the importance of calcium vitamin D and weightbearing exercises for osteoporosis prevention. Patient's vaccines are up-to-date. PCP is been doing her blood work. Pap smear no longer  needed according to new guidelines. Patient was reminded to schedule her mammogram next month.   Terrance Mass MD, 5:18 PM 01/13/2015

## 2015-01-26 ENCOUNTER — Ambulatory Visit (INDEPENDENT_AMBULATORY_CARE_PROVIDER_SITE_OTHER): Payer: BLUE CROSS/BLUE SHIELD

## 2015-01-26 DIAGNOSIS — Z780 Asymptomatic menopausal state: Secondary | ICD-10-CM

## 2015-01-26 DIAGNOSIS — Z8639 Personal history of other endocrine, nutritional and metabolic disease: Secondary | ICD-10-CM

## 2015-01-26 DIAGNOSIS — M8580 Other specified disorders of bone density and structure, unspecified site: Secondary | ICD-10-CM | POA: Diagnosis not present

## 2015-09-27 ENCOUNTER — Other Ambulatory Visit: Payer: Self-pay

## 2015-09-27 DIAGNOSIS — Z1231 Encounter for screening mammogram for malignant neoplasm of breast: Secondary | ICD-10-CM

## 2015-10-19 ENCOUNTER — Ambulatory Visit
Admission: RE | Admit: 2015-10-19 | Discharge: 2015-10-19 | Disposition: A | Discharge status: Home / Self Care | Payer: BLUE CROSS/BLUE SHIELD | Source: Ambulatory Visit

## 2015-10-19 DIAGNOSIS — Z1231 Encounter for screening mammogram for malignant neoplasm of breast: Secondary | ICD-10-CM

## 2015-10-20 ENCOUNTER — Other Ambulatory Visit: Payer: Self-pay | Admitting: Gynecology

## 2015-10-20 DIAGNOSIS — R928 Other abnormal and inconclusive findings on diagnostic imaging of breast: Secondary | ICD-10-CM

## 2015-10-27 ENCOUNTER — Ambulatory Visit
Admission: RE | Admit: 2015-10-27 | Discharge: 2015-10-27 | Disposition: A | Discharge status: Home / Self Care | Payer: BLUE CROSS/BLUE SHIELD | Source: Ambulatory Visit | Attending: Gynecology | Admitting: Gynecology

## 2015-10-27 ENCOUNTER — Other Ambulatory Visit: Payer: Self-pay | Admitting: Gynecology

## 2015-10-27 DIAGNOSIS — R928 Other abnormal and inconclusive findings on diagnostic imaging of breast: Secondary | ICD-10-CM

## 2015-10-31 DIAGNOSIS — C50919 Malignant neoplasm of unspecified site of unspecified female breast: Secondary | ICD-10-CM

## 2015-10-31 HISTORY — PX: BREAST LUMPECTOMY: SHX2

## 2015-10-31 HISTORY — DX: Malignant neoplasm of unspecified site of unspecified female breast: C50.919

## 2015-11-02 ENCOUNTER — Other Ambulatory Visit: Payer: Self-pay | Admitting: Gynecology

## 2015-11-02 DIAGNOSIS — R928 Other abnormal and inconclusive findings on diagnostic imaging of breast: Secondary | ICD-10-CM

## 2015-11-03 ENCOUNTER — Ambulatory Visit
Admission: RE | Admit: 2015-11-03 | Discharge: 2015-11-03 | Disposition: A | Discharge status: Home / Self Care | Payer: BLUE CROSS/BLUE SHIELD | Source: Ambulatory Visit | Attending: Gynecology | Admitting: Gynecology

## 2015-11-03 DIAGNOSIS — R928 Other abnormal and inconclusive findings on diagnostic imaging of breast: Secondary | ICD-10-CM

## 2015-11-08 ENCOUNTER — Other Ambulatory Visit: Payer: Self-pay | Admitting: General Surgery

## 2015-11-08 DIAGNOSIS — C50212 Malignant neoplasm of upper-inner quadrant of left female breast: Secondary | ICD-10-CM

## 2015-11-11 ENCOUNTER — Other Ambulatory Visit: Payer: Self-pay | Admitting: General Surgery

## 2015-11-11 DIAGNOSIS — C50212 Malignant neoplasm of upper-inner quadrant of left female breast: Secondary | ICD-10-CM

## 2015-11-12 ENCOUNTER — Telehealth: Payer: Self-pay | Admitting: *Deleted

## 2015-11-12 NOTE — Telephone Encounter (Signed)
Received an urgent request from [facility] to schedule this pt asap.  Went and obtained an appt from Dr. Lindi Adie.  Called pt and confirmed 11/15/15 appt w/ her.  Made Dawn aware.

## 2015-11-15 ENCOUNTER — Ambulatory Visit (HOSPITAL_BASED_OUTPATIENT_CLINIC_OR_DEPARTMENT_OTHER): Payer: BLUE CROSS/BLUE SHIELD

## 2015-11-15 ENCOUNTER — Other Ambulatory Visit: Payer: Self-pay | Admitting: *Deleted

## 2015-11-15 ENCOUNTER — Encounter: Payer: Self-pay | Admitting: Hematology and Oncology

## 2015-11-15 ENCOUNTER — Encounter: Payer: Self-pay | Admitting: *Deleted

## 2015-11-15 ENCOUNTER — Ambulatory Visit (HOSPITAL_BASED_OUTPATIENT_CLINIC_OR_DEPARTMENT_OTHER): Payer: BLUE CROSS/BLUE SHIELD | Admitting: Hematology and Oncology

## 2015-11-15 ENCOUNTER — Telehealth: Payer: Self-pay | Admitting: Hematology and Oncology

## 2015-11-15 VITALS — BP 148/94 | HR 79 | Temp 98.5°F | Resp 18 | Wt 139.0 lb

## 2015-11-15 DIAGNOSIS — C50412 Malignant neoplasm of upper-outer quadrant of left female breast: Secondary | ICD-10-CM

## 2015-11-15 LAB — CBC WITH DIFFERENTIAL/PLATELET
BASO%: 0.8 % (ref 0.0–2.0)
Basophils Absolute: 0 10*3/uL (ref 0.0–0.1)
EOS%: 1.3 % (ref 0.0–7.0)
Eosinophils Absolute: 0.1 10*3/uL (ref 0.0–0.5)
HCT: 42.8 % (ref 34.8–46.6)
HGB: 14.5 g/dL (ref 11.6–15.9)
LYMPH%: 38.6 % (ref 14.0–49.7)
MCH: 30.3 pg (ref 25.1–34.0)
MCHC: 33.9 g/dL (ref 31.5–36.0)
MCV: 89.5 fL (ref 79.5–101.0)
MONO#: 0.3 10*3/uL (ref 0.1–0.9)
MONO%: 7.7 % (ref 0.0–14.0)
NEUT#: 2 10*3/uL (ref 1.5–6.5)
NEUT%: 51.6 % (ref 38.4–76.8)
Platelets: 240 10*3/uL (ref 145–400)
RBC: 4.78 10*6/uL (ref 3.70–5.45)
RDW: 13.3 % (ref 11.2–14.5)
WBC: 3.9 10*3/uL (ref 3.9–10.3)
lymph#: 1.5 10*3/uL (ref 0.9–3.3)

## 2015-11-15 LAB — COMPREHENSIVE METABOLIC PANEL
ALT: 27 U/L (ref 0–55)
AST: 27 U/L (ref 5–34)
Albumin: 4 g/dL (ref 3.5–5.0)
Alkaline Phosphatase: 70 U/L (ref 40–150)
Anion Gap: 8 mEq/L (ref 3–11)
BUN: 10.5 mg/dL (ref 7.0–26.0)
CO2: 27 mEq/L (ref 22–29)
Calcium: 9.6 mg/dL (ref 8.4–10.4)
Chloride: 106 mEq/L (ref 98–109)
Creatinine: 0.8 mg/dL (ref 0.6–1.1)
EGFR: 88 mL/min/{1.73_m2} — ABNORMAL LOW (ref >90)
Glucose: 90 mg/dl (ref 70–140)
Potassium: 4.3 mEq/L (ref 3.5–5.1)
Sodium: 141 mEq/L (ref 136–145)
Total Bilirubin: 0.34 mg/dL (ref 0.20–1.20)
Total Protein: 7.4 g/dL (ref 6.4–8.3)

## 2015-11-15 NOTE — Progress Notes (Signed)
[facility] CONSULT NOTE  No care team member to display  CHIEF COMPLAINTS/PURPOSE OF CONSULTATION:  Newly diagnosed breast cancer  HISTORY OF PRESENTING ILLNESS:  Heather [name] 60 y.o. female is here because of recent diagnosis of left breast cancer. Patient had a screening mammogram that revealed a left breast asymmetry. This was further evaluated by diagnostic mammograms and ultrasound which revealed an upper outer quadrant mass measuring 1.1 x 0.8 x 1 cm. 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 She was presented at the multidisciplinary tumor board and she is here today to discuss a treatment plan.   I reviewed her records extensively and collaborated the history with the patient.  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 NSVD (normal spontaneous vaginal delivery)     SURGICAL HISTORY: Past Surgical History  Procedure Laterality Date  . Cesarean section      BTSP  . Abdominal hysterectomy      TAH   (FIBROIDS)    SOCIAL HISTORY: Social History   Social History  . Marital Status: Married    Spouse Name: N/A  . Number of Children: N/A  . Years of Education: N/A   Occupational History  . Not on file.   Social History Main Topics  . Smoking status: Never Smoker   . Smokeless tobacco: Never Used  . Alcohol Use: 0.0 oz/week    0 Standard drinks or equivalent per week     Comment: occ  . Drug Use: No  . Sexual Activity: Yes   Birth Control/ Protection: Surgical     Comment: hyst, intercourse age 62, less than 5 sexual partners   Other Topics Concern  . Not on file   Social History Narrative    FAMILY HISTORY: Family History  Problem Relation Age of Onset  . Diabetes Brother     ALLERGIES:  has No Known Allergies.  MEDICATIONS:  Current Outpatient Prescriptions  Medication Sig Dispense Refill  . calcium carbonate (OS-CAL) 600 MG TABS Take 600 mg by mouth 2 (two) times daily with a meal.    . cholecalciferol (VITAMIN D) 1000 UNITS tablet Take 1,000 Units by mouth daily.     No current facility-administered medications for this visit.    REVIEW OF SYSTEMS:   Constitutional: Denies fevers, chills or abnormal night sweats Eyes: Denies blurriness of vision, double vision or watery eyes Ears, nose, mouth, throat, and face: Denies mucositis or sore throat Respiratory: Denies cough, dyspnea or wheezes Cardiovascular: Denies palpitation, chest discomfort or lower extremity swelling Gastrointestinal:  Denies nausea, heartburn or change in bowel habits Skin: Denies abnormal skin rashes Lymphatics: Denies new lymphadenopathy or easy bruising Neurological:Denies numbness, tingling or new weaknesses Behavioral/Psych: Mood is stable, no new changes  Breast:  Denies any palpable lumps or discharge All other systems were reviewed with the patient and are negative.  PHYSICAL EXAMINATION: ECOG PERFORMANCE STATUS: 0 - Asymptomatic  Filed Vitals:   11/15/15 0815  BP: 148/94  Pulse: 79  Temp: 98.5 F (36.9 C)  Resp: 18   Filed Weights   11/15/15 0815  Weight: 139 lb (63.05 kg)    GENERAL:alert, no distress and comfortable SKIN: skin color, texture, turgor are normal, no rashes or significant lesions EYES: normal, conjunctiva are pink and non-injected, sclera clear OROPHARYNX:no exudate, no erythema and lips, buccal mucosa, and tongue normal  NECK: supple, thyroid normal size, non-tender, without  nodularity LYMPH:  no palpable lymphadenopathy in the cervical, axillary or inguinal LUNGS: clear to auscultation and percussion with normal breathing effort HEART: regular rate & rhythm and no murmurs and no lower extremity edema ABDOMEN:abdomen soft, non-tender and normal bowel sounds Musculoskeletal:no cyanosis of digits and no clubbing  PSYCH: alert & oriented x 3 with fluent speech NEURO: no focal motor/sensory deficits BREAST: No palpable nodules in breast. No palpable axillary or supraclavicular lymphadenopathy (exam performed in the presence of a chaperone)   LABORATORY DATA:  I have reviewed the data as listed Lab Results  Component Value Date   WBC 5.9 02/27/2012   HGB 14.5 02/27/2012   HCT 41.9 02/27/2012   MCV 88.0 02/27/2012   PLT 231 02/27/2012   No results found for: NA, K, CL, CO2  RADIOGRAPHIC STUDIES: I have personally reviewed the radiological reports and agreed with the findings in the report.  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 After reviewing the pathology in detail, we proceeded to discuss the different treatment options between surgery, radiation, chemotherapy, antiestrogen therapies.  Recommendations: 1. Breast conserving surgery followed by 2. Oncotype DX testing to determine if chemotherapy would be of any benefit followed by 3. Adjuvant radiation therapy followed by 4. Adjuvant antiestrogen therapy  Oncotype  counseling: I discussed Oncotype DX test. I explained to the patient that this is a 21 gene panel to evaluate patient tumors DNA to calculate recurrence score. This would help determine whether patient has high risk or intermediate risk or low risk breast cancer. She understands that if her tumor was found to be high risk, she would benefit from systemic chemotherapy. If low risk, no need of chemotherapy. If she was found to be intermediate risk, we would need to evaluate the score as well as other risk factors and determine if an abbreviated chemotherapy may be of benefit.  Return to clinic after surgery to discuss final pathology report and then determine if Oncotype DX testing will need to be sent.      All questions were answered. The patient knows to call the clinic with any problems, questions or concerns.    Rulon Eisenmenger, MD 11/15/2015

## 2015-11-15 NOTE — Assessment & Plan Note (Signed)
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 After reviewing the pathology in detail, we proceeded to discuss the different treatment options between surgery, radiation, chemotherapy, antiestrogen therapies.  Recommendations: 1. Breast conserving surgery followed by 2. Oncotype DX testing to determine if chemotherapy would be of any benefit followed by 3. Adjuvant radiation therapy followed by 4. Adjuvant antiestrogen therapy  Oncotype counseling: I discussed Oncotype DX test. I explained to the patient that this is a 21 gene panel to evaluate patient tumors DNA to calculate recurrence score. This would help determine whether patient has high risk or intermediate risk or low risk breast cancer. She understands that if her tumor was found to be high risk, she would benefit from systemic chemotherapy. If low risk, no need of chemotherapy. If she was found to be intermediate risk, we would need to evaluate the score as well as other risk factors and determine if an abbreviated chemotherapy may be of benefit.  Return to clinic after surgery to discuss final pathology report and then determine if Oncotype DX testing will need to be sent.

## 2015-11-15 NOTE — Telephone Encounter (Signed)
Appointments made and avs printed for patient °

## 2015-11-15 NOTE — Progress Notes (Signed)
Received copy of patient's Anthony, sent to scan.

## 2015-11-15 NOTE — Progress Notes (Signed)
Note created by Dr. Gudena during office visit, copy to patient,original to scan. 

## 2015-11-16 ENCOUNTER — Encounter (HOSPITAL_BASED_OUTPATIENT_CLINIC_OR_DEPARTMENT_OTHER): Payer: Self-pay | Admitting: *Deleted

## 2015-11-16 LAB — FOLLICLE STIMULATING HORMONE: FSH: 74.4 m[IU]/mL

## 2015-11-17 ENCOUNTER — Ambulatory Visit
Admission: RE | Admit: 2015-11-17 | Discharge: 2015-11-17 | Disposition: A | Discharge status: Home / Self Care | Payer: BLUE CROSS/BLUE SHIELD | Source: Ambulatory Visit | Attending: General Surgery | Admitting: General Surgery

## 2015-11-17 DIAGNOSIS — C50212 Malignant neoplasm of upper-inner quadrant of left female breast: Secondary | ICD-10-CM

## 2015-11-18 LAB — ESTRADIOL, ULTRA SENS: Estradiol, Sensitive: 6.5 pg/mL

## 2015-11-22 ENCOUNTER — Ambulatory Visit
Admission: RE | Admit: 2015-11-22 | Discharge: 2015-11-22 | Disposition: A | Discharge status: Home / Self Care | Payer: BLUE CROSS/BLUE SHIELD | Source: Ambulatory Visit | Attending: General Surgery | Admitting: General Surgery

## 2015-11-22 ENCOUNTER — Ambulatory Visit (HOSPITAL_COMMUNITY)
Admission: RE | Admit: 2015-11-22 | Discharge: 2015-11-22 | Disposition: A | Discharge status: Home / Self Care | Payer: BLUE CROSS/BLUE SHIELD | Source: Ambulatory Visit | Attending: General Surgery | Admitting: General Surgery

## 2015-11-22 ENCOUNTER — Ambulatory Visit (HOSPITAL_BASED_OUTPATIENT_CLINIC_OR_DEPARTMENT_OTHER)
Admission: RE | Admit: 2015-11-22 | Discharge: 2015-11-22 | Disposition: A | Discharge status: Home / Self Care | Payer: BLUE CROSS/BLUE SHIELD | Source: Ambulatory Visit | Attending: General Surgery | Admitting: General Surgery

## 2015-11-22 ENCOUNTER — Ambulatory Visit (HOSPITAL_BASED_OUTPATIENT_CLINIC_OR_DEPARTMENT_OTHER): Payer: BLUE CROSS/BLUE SHIELD | Admitting: Anesthesiology

## 2015-11-22 ENCOUNTER — Encounter (HOSPITAL_BASED_OUTPATIENT_CLINIC_OR_DEPARTMENT_OTHER): Payer: Self-pay

## 2015-11-22 ENCOUNTER — Encounter (HOSPITAL_BASED_OUTPATIENT_CLINIC_OR_DEPARTMENT_OTHER)
Admission: RE | Disposition: A | Discharge status: Home / Self Care | Payer: Self-pay | Source: Ambulatory Visit | Attending: General Surgery

## 2015-11-22 DIAGNOSIS — C50212 Malignant neoplasm of upper-inner quadrant of left female breast: Secondary | ICD-10-CM | POA: Insufficient documentation

## 2015-11-22 DIAGNOSIS — C50912 Malignant neoplasm of unspecified site of left female breast: Secondary | ICD-10-CM | POA: Diagnosis present

## 2015-11-22 HISTORY — PX: RADIOACTIVE SEED GUIDED PARTIAL MASTECTOMY WITH AXILLARY SENTINEL LYMPH NODE BIOPSY: SHX6520

## 2015-11-22 SURGERY — RADIOACTIVE SEED GUIDED PARTIAL MASTECTOMY WITH AXILLARY SENTINEL LYMPH NODE BIOPSY
Anesthesia: General | Site: Breast | Laterality: Left

## 2015-11-22 MED ORDER — EPHEDRINE SULFATE 50 MG/ML IJ SOLN
INTRAMUSCULAR | Status: DC | PRN
  Administered 2015-11-22: 10 mg via INTRAVENOUS

## 2015-11-22 MED ORDER — BUPIVACAINE HCL (PF) 0.25 % IJ SOLN
INTRAMUSCULAR | Status: DC | PRN
  Administered 2015-11-22: 17 mL

## 2015-11-22 MED ORDER — ONDANSETRON HCL 4 MG/2ML IJ SOLN
INTRAMUSCULAR | Status: DC | PRN
  Administered 2015-11-22: 4 mg via INTRAVENOUS

## 2015-11-22 MED ORDER — MIDAZOLAM HCL 2 MG/2ML IJ SOLN
INTRAMUSCULAR | Status: AC
  Filled 2015-11-22: qty 2

## 2015-11-22 MED ORDER — MEPERIDINE HCL 25 MG/ML IJ SOLN: 6.2500 mg | INTRAMUSCULAR | Status: DC | PRN

## 2015-11-22 MED ORDER — OXYCODONE HCL 5 MG/5ML PO SOLN: 5.0000 mg | Freq: Once | ORAL | Status: DC | PRN

## 2015-11-22 MED ORDER — DEXAMETHASONE SODIUM PHOSPHATE 4 MG/ML IJ SOLN
INTRAMUSCULAR | Status: DC | PRN
  Administered 2015-11-22: 10 mg via INTRAVENOUS

## 2015-11-22 MED ORDER — HYDROMORPHONE HCL 1 MG/ML IJ SOLN
0.2500 mg | INTRAMUSCULAR | Status: DC | PRN
  Administered 2015-11-22 (×4): 0.5 mg via INTRAVENOUS

## 2015-11-22 MED ORDER — HYDROMORPHONE HCL 1 MG/ML IJ SOLN
INTRAMUSCULAR | Status: AC
  Filled 2015-11-22: qty 1

## 2015-11-22 MED ORDER — LIDOCAINE HCL (CARDIAC) 20 MG/ML IV SOLN
INTRAVENOUS | Status: DC | PRN
  Administered 2015-11-22: 80 mg via INTRAVENOUS

## 2015-11-22 MED ORDER — CHLORHEXIDINE GLUCONATE 4 % EX LIQD: 1.0000 "application " | Freq: Once | CUTANEOUS | Status: DC

## 2015-11-22 MED ORDER — ONDANSETRON HCL 4 MG/2ML IJ SOLN
INTRAMUSCULAR | Status: AC
  Filled 2015-11-22: qty 2

## 2015-11-22 MED ORDER — FENTANYL CITRATE (PF) 100 MCG/2ML IJ SOLN
INTRAMUSCULAR | Status: DC | PRN
  Administered 2015-11-22: 50 ug via INTRAVENOUS

## 2015-11-22 MED ORDER — MIDAZOLAM HCL 2 MG/2ML IJ SOLN
1.0000 mg | INTRAMUSCULAR | Status: DC | PRN
  Administered 2015-11-22: 2 mg via INTRAVENOUS
  Administered 2015-11-22 (×2): 1 mg via INTRAVENOUS

## 2015-11-22 MED ORDER — TECHNETIUM TC 99M SULFUR COLLOID FILTERED
1.0000 | Freq: Once | INTRAVENOUS | Status: AC | PRN
  Administered 2015-11-22: 1 via INTRADERMAL

## 2015-11-22 MED ORDER — FENTANYL CITRATE (PF) 100 MCG/2ML IJ SOLN
50.0000 ug | INTRAMUSCULAR | Status: DC | PRN
  Administered 2015-11-22 (×2): 50 ug via INTRAVENOUS

## 2015-11-22 MED ORDER — LIDOCAINE HCL (CARDIAC) 20 MG/ML IV SOLN
INTRAVENOUS | Status: AC
  Filled 2015-11-22: qty 5

## 2015-11-22 MED ORDER — OXYCODONE-ACETAMINOPHEN 5-325 MG PO TABS: 1.0000 | ORAL_TABLET | ORAL | Status: DC | PRN

## 2015-11-22 MED ORDER — PROPOFOL 10 MG/ML IV BOLUS
INTRAVENOUS | Status: AC
  Filled 2015-11-22: qty 40

## 2015-11-22 MED ORDER — SCOPOLAMINE 1 MG/3DAYS TD PT72: 1.0000 | MEDICATED_PATCH | Freq: Once | TRANSDERMAL | Status: DC

## 2015-11-22 MED ORDER — DEXAMETHASONE SODIUM PHOSPHATE 10 MG/ML IJ SOLN
INTRAMUSCULAR | Status: AC
  Filled 2015-11-22: qty 1

## 2015-11-22 MED ORDER — LACTATED RINGERS IV SOLN
INTRAVENOUS | Status: DC
  Administered 2015-11-22 (×2): via INTRAVENOUS

## 2015-11-22 MED ORDER — BUPIVACAINE-EPINEPHRINE (PF) 0.5% -1:200000 IJ SOLN
INTRAMUSCULAR | Status: DC | PRN
  Administered 2015-11-22: 30 mL via PERINEURAL

## 2015-11-22 MED ORDER — FENTANYL CITRATE (PF) 100 MCG/2ML IJ SOLN
INTRAMUSCULAR | Status: AC
  Filled 2015-11-22: qty 2

## 2015-11-22 MED ORDER — CEFAZOLIN SODIUM-DEXTROSE 2-3 GM-% IV SOLR
2.0000 g | INTRAVENOUS | Status: AC
  Administered 2015-11-22: 2 g via INTRAVENOUS

## 2015-11-22 MED ORDER — OXYCODONE HCL 5 MG PO TABS: 5.0000 mg | ORAL_TABLET | Freq: Once | ORAL | Status: DC | PRN

## 2015-11-22 MED ORDER — PROPOFOL 10 MG/ML IV BOLUS
INTRAVENOUS | Status: DC | PRN
  Administered 2015-11-22: 180 mg via INTRAVENOUS

## 2015-11-22 MED ORDER — GLYCOPYRROLATE 0.2 MG/ML IJ SOLN: 0.2000 mg | Freq: Once | INTRAMUSCULAR | Status: DC | PRN

## 2015-11-22 SURGICAL SUPPLY — 41 items
APPLIER CLIP 9.375 MED OPEN (MISCELLANEOUS) ×2
BLADE SURG 15 STRL LF DISP TIS (BLADE) ×1 IMPLANT
BLADE SURG 15 STRL SS (BLADE) ×1
CANISTER SUC SOCK COL 7IN (MISCELLANEOUS) IMPLANT
CANISTER SUCT 1200ML W/VALVE (MISCELLANEOUS) IMPLANT
CHLORAPREP W/TINT 26ML (MISCELLANEOUS) ×2 IMPLANT
CLIP APPLIE 9.375 MED OPEN (MISCELLANEOUS) ×1 IMPLANT
COVER BACK TABLE 60X90IN (DRAPES) ×2 IMPLANT
COVER MAYO STAND STRL (DRAPES) ×2 IMPLANT
COVER PROBE W GEL 5X96 (DRAPES) ×2 IMPLANT
DECANTER SPIKE VIAL GLASS SM (MISCELLANEOUS) IMPLANT
DEVICE DUBIN W/COMP PLATE 8390 (MISCELLANEOUS) ×2 IMPLANT
DRAPE LAPAROSCOPIC ABDOMINAL (DRAPES) ×2 IMPLANT
DRAPE UTILITY XL STRL (DRAPES) ×2 IMPLANT
ELECT COATED BLADE 2.86 ST (ELECTRODE) ×2 IMPLANT
ELECT REM PT RETURN 9FT ADLT (ELECTROSURGICAL) ×2
ELECTRODE REM PT RTRN 9FT ADLT (ELECTROSURGICAL) ×1 IMPLANT
GLOVE BIO SURGEON STRL SZ7.5 (GLOVE) ×2 IMPLANT
GLOVE BIOGEL PI IND STRL 7.0 (GLOVE) ×2 IMPLANT
GLOVE BIOGEL PI INDICATOR 7.0 (GLOVE) ×2
GLOVE ECLIPSE 6.5 STRL STRAW (GLOVE) ×2 IMPLANT
GOWN STRL REUS W/ TWL LRG LVL3 (GOWN DISPOSABLE) ×2 IMPLANT
GOWN STRL REUS W/TWL LRG LVL3 (GOWN DISPOSABLE) ×2
KIT MARKER MARGIN INK (KITS) ×2 IMPLANT
LIQUID BAND (GAUZE/BANDAGES/DRESSINGS) ×4 IMPLANT
NDL SAFETY ECLIPSE 18X1.5 (NEEDLE) IMPLANT
NEEDLE HYPO 18GX1.5 SHARP (NEEDLE)
NEEDLE HYPO 25X1 1.5 SAFETY (NEEDLE) ×2 IMPLANT
NS IRRIG 1000ML POUR BTL (IV SOLUTION) ×2 IMPLANT
PACK BASIN DAY SURGERY FS (CUSTOM PROCEDURE TRAY) ×2 IMPLANT
PENCIL BUTTON HOLSTER BLD 10FT (ELECTRODE) ×2 IMPLANT
SLEEVE SCD COMPRESS KNEE MED (MISCELLANEOUS) ×2 IMPLANT
SPONGE LAP 18X18 X RAY DECT (DISPOSABLE) ×2 IMPLANT
SUT MON AB 4-0 PC3 18 (SUTURE) ×4 IMPLANT
SUT SILK 2 0 SH (SUTURE) IMPLANT
SUT VICRYL 3-0 CR8 SH (SUTURE) ×2 IMPLANT
SYR CONTROL 10ML LL (SYRINGE) ×2 IMPLANT
TOWEL OR 17X24 6PK STRL BLUE (TOWEL DISPOSABLE) ×2 IMPLANT
TOWEL OR NON WOVEN STRL DISP B (DISPOSABLE) ×2 IMPLANT
TUBE CONNECTING 20X1/4 (TUBING) ×2 IMPLANT
YANKAUER SUCT BULB TIP NO VENT (SUCTIONS) ×2 IMPLANT

## 2015-11-22 NOTE — Anesthesia Preprocedure Evaluation (Signed)
Anesthesia Evaluation  Patient identified by MRN, date of birth, ID band Patient awake    Reviewed: Allergy & Precautions, NPO status , Patient's Chart, lab work & pertinent test results  Airway Mallampati: I  TM Distance: >3 FB Neck ROM: Full    Dental  (+) Teeth Intact, Dental Advisory Given   Pulmonary    breath sounds clear to auscultation       Cardiovascular  Rhythm:Regular Rate:Normal     Neuro/Psych    GI/Hepatic   Endo/Other    Renal/GU      Musculoskeletal   Abdominal   Peds  Hematology   Anesthesia Other Findings   Reproductive/Obstetrics                             Anesthesia Physical Anesthesia Plan  ASA: II  Anesthesia Plan: General   Post-op Pain Management: MAC Combined w/ Regional for Post-op pain   Induction: Intravenous  Airway Management Planned: LMA  Additional Equipment:   Intra-op Plan:   Post-operative Plan: Extubation in OR  Informed Consent: I have reviewed the patients History and Physical, chart, labs and discussed the procedure including the risks, benefits and alternatives for the proposed anesthesia with the patient or authorized representative who has indicated his/her understanding and acceptance.   Dental advisory given  Plan Discussed with: CRNA, Anesthesiologist and Surgeon  Anesthesia Plan Comments:         Anesthesia Quick Evaluation  

## 2015-11-22 NOTE — Anesthesia Postprocedure Evaluation (Signed)
Anesthesia Post Note  Patient: Heather [name]  Procedure(s) Performed: Procedure(s) (LRB): RADIOACTIVE SEED GUIDED PARTIAL MASTECTOMY WITH AXILLARY SENTINEL LYMPH NODE BIOPSY (Left)  Patient location during evaluation: PACU Anesthesia Type: General Level of consciousness: awake and alert Pain management: pain level controlled Vital Signs Assessment: post-procedure vital signs reviewed and stable Respiratory status: spontaneous breathing, nonlabored ventilation and respiratory function stable Cardiovascular status: blood pressure returned to baseline and stable Postop Assessment: no signs of nausea or vomiting Anesthetic complications: no    Last Vitals:  Filed Vitals:   11/22/15 1200 11/22/15 1215  BP: 101/65 93/63  Pulse: 74 74  Temp:    Resp: 13 13    Last Pain:  Filed Vitals:   11/22/15 1222  PainSc: 4                  Jatasia Gundrum A

## 2015-11-22 NOTE — Op Note (Signed)
11/22/2015  10:40 AM  PATIENT:  Heather [name]  60 y.o. female  PRE-OPERATIVE DIAGNOSIS:  LEFT BREAST CANCER  POST-OPERATIVE DIAGNOSIS:  LEFT BREAST CANCER  PROCEDURE:  Procedure(s): RADIOACTIVE SEED GUIDED PARTIAL MASTECTOMY WITH AXILLARY SENTINEL LYMPH NODE BIOPSY (Left)  SURGEON:  Surgeon(s) and Role:    * Jovita Kussmaul, MD - Primary  PHYSICIAN ASSISTANT:   ASSISTANTS: none   ANESTHESIA:   general  EBL:  Total I/O In: 1000 [I.V.:1000] Out: -   BLOOD ADMINISTERED:none  DRAINS: none   LOCAL MEDICATIONS USED:  MARCAINE     SPECIMEN:  Source of Specimen:  left breast tissue and sentinel nodes X 2  DISPOSITION OF SPECIMEN:  PATHOLOGY  COUNTS:  YES  TOURNIQUET:  * No tourniquets in log *  DICTATION: .Dragon Dictation   After informed consent was obtained the patient was brought to the operating room and placed in the supine position on the operating table. After adequate induction of general anesthesia the patient's left chest, breast, and axillary area were prepped with ChloraPrep, allowed to dry, and draped in usual sterile manner. Earlier in the day the patient underwent injection of 1 mCi of technetium sulfur colloid in the subareolar position on the left. Previously an I-125 seed was placed in the upper portion of the left breast to mark an area of invasive breast cancer. The neoprobe was initially set to technetium in the left axilla was examined. There was an increased area of radioactivity readily identified. A small transversely oriented incision was made overlying the hot spot with a 15 blade knife. The incision was carried through the skin and subcutaneous tissue sharply with the electrocautery until the axilla was entered. Using the neoprobe to direct blunt hemostat dissection I was able to identify 2 lymph nodes. These were both excised sharply with the electrocautery and the lymphatics were controlled with clips. Ex vivo counts on sentinel node #1 were  approximately 600. Ex vivo counts on sentinel node #2 were approximately 100. There were no other hot or palpable lymph nodes identified in the left axilla. The area was infiltrated with quarter percent Marcaine. The deep layer of the wound was closed with interrupted 3-0 Vicryl stitches. The skin was then closed with a running 4-0 Monocryl subcuticular stitch. Attention was then turned to the left breast. The neoprobe was set to I-125. The area of radioactivity was readily identified in the 12:00 position of the left breast. An elliptical incision was made in the skin overlying the area of radioactivity with a 15 blade knife. The incision was carried through the skin and subcutaneous tissue sharply with the electrocautery. While checking the area of radioactivity frequently a circular portion of breast tissue was excised sharply around the radioactive seed. Once the specimen was removed it was oriented with the appropriate paint colors. A specimen radiograph was obtained that showed the clip in seed to be in the center of the specimen. Specimen was then sent to pathology for further evaluation. Hemostasis was achieved using the Bovie electrocautery. The wound was infiltrated with quarter percent Marcaine and irrigated with saline. The deep layer of the wound was then closed with layers of interrupted 3-0 Vicryl stitches. The skin was then closed with interrupted 4-0 Monocryl subcuticular stitches. Dermabond dressings were applied. The patient tolerated the procedure well. At the end of the case all needle sponge and instrument counts were correct. The patient was then awakened and taken to recovery in stable condition.  PLAN OF CARE: Discharge to  home after PACU  PATIENT DISPOSITION:  PACU - hemodynamically stable.   Delay start of Pharmacological VTE agent (>24hrs) due to surgical blood loss or risk of bleeding: not applicable

## 2015-11-22 NOTE — Progress Notes (Signed)
Assisted Dr. Crews with left, ultrasound guided, pectoralis block. Side rails up, monitors on throughout procedure. See vital signs in flow sheet. Tolerated Procedure well. 

## 2015-11-22 NOTE — Discharge Instructions (Signed)

## 2015-11-22 NOTE — H&P (Signed)
Heather [name]  Location: [facility] Patient #: [id number] DOB: 05-20-1956 Married / Language: Spanish / Race: Undefined Female   History of Present Illness  Patient words: breast eval.  The patient is a 60 year old female who presents with breast cancer. We are asked to see the patient in consultation by Dr. Michiel Cowboy to evaluate her for a new left breast cancer. The patient is a 60 year old female who recently went for a routine screening mammogram. At that time she was found to have a mass in the 12 o'clock position of the left breast. It measured 1.3 cm by ultrasound. It was biopsied and came back as an invasive breast cancer. Her tumor markers are pending. She denies any breast pain. She denies any discharge from the nipple. She has no personal or family history of breast cancer.   Allergies  No Known Drug Allergies01/06/2016  Medication History  No Current Medications Medications Reconciled    Review of Systems  General Not Present- Appetite Loss, Chills, Fatigue, Fever, Night Sweats, Weight Gain and Weight Loss. Skin Not Present- Change in Wart/Mole, Dryness, Hives, Jaundice, New Lesions, Non-Healing Wounds, Rash and Ulcer. HEENT Not Present- Earache, Hearing Loss, Hoarseness, Nose Bleed, Oral Ulcers, Ringing in the Ears, Seasonal Allergies, Sinus Pain, Sore Throat, Visual Disturbances, Wears glasses/contact lenses and Yellow Eyes. Respiratory Not Present- Bloody sputum, Chronic Cough, Difficulty Breathing, Snoring and Wheezing. Breast Not Present- Breast Mass, Breast Pain, Nipple Discharge and Skin Changes. Cardiovascular Not Present- Chest Pain, Difficulty Breathing Lying Down, Leg Cramps, Palpitations, Rapid Heart Rate, Shortness of Breath and Swelling of Extremities. Gastrointestinal Not Present- Abdominal Pain, Bloating, Bloody Stool, Change in Bowel Habits, Chronic diarrhea, Constipation, Difficulty Swallowing, Excessive gas, Gets full quickly  at meals, Hemorrhoids, Indigestion, Nausea, Rectal Pain and Vomiting. Female Genitourinary Not Present- Frequency, Nocturia, Painful Urination, Pelvic Pain and Urgency. Musculoskeletal Not Present- Back Pain, Joint Pain, Joint Stiffness, Muscle Pain, Muscle Weakness and Swelling of Extremities. Neurological Not Present- Decreased Memory, Fainting, Headaches, Numbness, Seizures, Tingling, Tremor, Trouble walking and Weakness. Psychiatric Not Present- Anxiety, Bipolar, Change in Sleep Pattern, Depression, Fearful and Frequent crying. Endocrine Not Present- Cold Intolerance, Excessive Hunger, Hair Changes, Heat Intolerance, Hot flashes and New Diabetes. Hematology Not Present- Easy Bruising, Excessive bleeding, Gland problems, HIV and Persistent Infections.  Vitals  Weight: 139.8 lb Height: 57in Body Surface Area: 1.54 m Body Mass Index: 30.25 kg/m  Temp.: 37F(Temporal)  Pulse: 75 (Regular)  BP: 128/78 (Sitting, Left Arm, Standard)       Physical Exam  General Mental Status-Alert. General Appearance-Consistent with stated age. Hydration-Well hydrated. Voice-Normal.  Head and Neck Head-normocephalic, atraumatic with no lesions or palpable masses. Trachea-midline. Thyroid Gland Characteristics - normal size and consistency.  Eye Eyeball - Bilateral-Extraocular movements intact. Sclera/Conjunctiva - Bilateral-No scleral icterus.  Chest and Lung Exam Chest and lung exam reveals -quiet, even and easy respiratory effort with no use of accessory muscles and on auscultation, normal breath sounds, no adventitious sounds and normal vocal resonance. Inspection Chest Wall - Normal. Back - normal.  Breast Note: There is a palpable bruising in the upper portion of the left breast. Other than this there is no palpable mass in either breast. There is no palpable axillary, supraclavicular, or cervical lymphadenopathy.   Cardiovascular Cardiovascular  examination reveals -normal heart sounds, regular rate and rhythm with no murmurs and normal pedal pulses bilaterally.  Abdomen Inspection Inspection of the abdomen reveals - No Hernias. Skin - Scar - no surgical scars. Palpation/Percussion Palpation and  Percussion of the abdomen reveal - Soft, Non Tender, No Rebound tenderness, No Rigidity (guarding) and No hepatosplenomegaly. Auscultation Auscultation of the abdomen reveals - Bowel sounds normal.  Neurologic Neurologic evaluation reveals -alert and oriented x 3 with no impairment of recent or remote memory. Mental Status-Normal.  Musculoskeletal Normal Exam - Left-Upper Extremity Strength Normal and Lower Extremity Strength Normal. Normal Exam - Right-Upper Extremity Strength Normal and Lower Extremity Strength Normal.  Lymphatic Head & Neck  General Head & Neck Lymphatics: Bilateral - Description - Normal. Axillary  General Axillary Region: Bilateral - Description - Normal. Tenderness - Non Tender. Femoral & Inguinal  Generalized Femoral & Inguinal Lymphatics: Bilateral - Description - Normal. Tenderness - Non Tender.    Assessment & Plan  BREAST CANCER OF UPPER-INNER QUADRANT OF LEFT FEMALE BREAST (C50.212) Impression: The patient appears to have a clinical stage I cancer in the upper portion of the left breast. I have talked her in detail about the different options for treatment and at this point she favors breast conservation. She is also a good candidate for sentinel node mapping. I have discussed with her in detail the risks and benefits of the operation as well as some of the technical aspects and she understands and wishes to proceed. I will plan for a left breast radioactive seed localized lumpectomy and sentinel node mapping. Her tumor markers are still pending.I will also refer her to medical and radiation oncology. Current Plans Pt Education - Patient education: Breast cancer (The Basics): discussed with  patient and provided information. Referred to Oncology, for evaluation and follow up (Oncology). Routine.   Signed by Luella Cook, MD

## 2015-11-22 NOTE — Transfer of Care (Signed)
Immediate Anesthesia Transfer of Care Note  Patient: Heather [name]  Procedure(s) Performed: Procedure(s): RADIOACTIVE SEED GUIDED PARTIAL MASTECTOMY WITH AXILLARY SENTINEL LYMPH NODE BIOPSY (Left)  Patient Location: PACU  Anesthesia Type:General  Level of Consciousness: sedated and responds to stimulation  Airway & Oxygen Therapy: Patient Spontanous Breathing and Patient connected to face mask oxygen  Post-op Assessment: Report given to RN  Post vital signs: Reviewed and stable  Last Vitals: 114/68, 82, 19, 100% Filed Vitals:   11/22/15 0920 11/22/15 1045  BP:    Pulse: 88 88  Temp:    Resp: 18     Complications: No apparent anesthesia complications

## 2015-11-22 NOTE — Interval H&P Note (Signed)
History and Physical Interval Note:  11/22/2015 9:16 AM  Heather [name]  has presented today for surgery, with the diagnosis of LEFT BREAST CANCER  The various methods of treatment have been discussed with the patient and family. After consideration of risks, benefits and other options for treatment, the patient has consented to  Procedure(s): RADIOACTIVE SEED GUIDED PARTIAL MASTECTOMY WITH AXILLARY SENTINEL LYMPH NODE BIOPSY (Left) as a surgical intervention .  The patient's history has been reviewed, patient examined, no change in status, stable for surgery.  I have reviewed the patient's chart and labs.  Questions were answered to the patient's satisfaction.     TOTH III,PAUL S

## 2015-11-22 NOTE — Anesthesia Procedure Notes (Addendum)
Anesthesia Regional Block:  Pectoralis block  Pre-Anesthetic Checklist: ,, timeout performed, Correct Patient, Correct Site, Correct Laterality, Correct Procedure, Correct Position, site marked, Risks and benefits discussed,  Surgical consent,  Pre-op evaluation,  At surgeon's request and post-op pain management  Laterality: Left and Upper  Prep: chloraprep       Needles:  Injection technique: Single-shot  Needle Type: Echogenic Needle     Needle Length: 9cm 9 cm Needle Gauge: 21 and 21 G    Additional Needles:  Procedures: ultrasound guided (picture in chart) Pectoralis block Narrative:  Start time: 11/22/2015 9:05 AM End time: 11/22/2015 9:10 AM Injection made incrementally with aspirations every 5 mL.  Performed by: Personally  Anesthesiologist: CREWS, DAVID   Procedure Name: LMA Insertion Date/Time: 11/22/2015 9:37 AM Performed by: Bethena Roys T Pre-anesthesia Checklist: Patient identified, Emergency Drugs available, Suction available and Patient being monitored Patient Re-evaluated:Patient Re-evaluated prior to inductionOxygen Delivery Method: Circle System Utilized Preoxygenation: Pre-oxygenation with 100% oxygen Intubation Type: IV induction Ventilation: Mask ventilation without difficulty LMA: LMA inserted LMA Size: 4.0 Number of attempts: 1 Airway Equipment and Method: Bite block Placement Confirmation: positive ETCO2 Dental Injury: Teeth and Oropharynx as per pre-operative assessment

## 2015-11-23 ENCOUNTER — Encounter (HOSPITAL_BASED_OUTPATIENT_CLINIC_OR_DEPARTMENT_OTHER): Payer: Self-pay | Admitting: General Surgery

## 2015-11-23 NOTE — Addendum Note (Signed)
Addendum  created 11/23/15 1013 by Tawni Millers, CRNA   Modules edited: Charges VN

## 2015-11-28 NOTE — Assessment & Plan Note (Signed)
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 I discussed the margins as well as lymph node surgeries. We also discussed the final staging along with previously performed ER/PR and HER-2/neu testing.  Recommendation:  1. Oncotype DX testing to determine if chemotherapy would be of any benefit followed by 2. Adjuvant radiation therapy followed by 3. Adjuvant antiestrogen therapy 

## 2015-11-29 ENCOUNTER — Ambulatory Visit (HOSPITAL_BASED_OUTPATIENT_CLINIC_OR_DEPARTMENT_OTHER): Payer: BLUE CROSS/BLUE SHIELD | Admitting: Hematology and Oncology

## 2015-11-29 ENCOUNTER — Encounter: Payer: Self-pay | Admitting: Hematology and Oncology

## 2015-11-29 ENCOUNTER — Telehealth: Payer: Self-pay | Admitting: *Deleted

## 2015-11-29 ENCOUNTER — Telehealth: Payer: Self-pay | Admitting: Hematology and Oncology

## 2015-11-29 VITALS — BP 104/64 | HR 87 | Temp 98.4°F | Resp 18 | Ht 60.0 in | Wt 139.2 lb

## 2015-11-29 DIAGNOSIS — C50412 Malignant neoplasm of upper-outer quadrant of left female breast: Secondary | ICD-10-CM | POA: Diagnosis not present

## 2015-11-29 NOTE — Progress Notes (Signed)
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 She is here to discuss the final pathology report. She is healing very well from the surgery and appears to be doing quite well. She is not using any pain medications. She is interested to learn about the pathology report and to discuss adjuvant treatment plan.  REVIEW OF SYSTEMS:   Constitutional: Denies fevers, chills or abnormal weight loss Eyes: Denies blurriness of vision Ears, nose, mouth, throat, and face: Denies mucositis or sore throat Respiratory: Denies cough, dyspnea or wheezes Cardiovascular: Denies palpitation, chest discomfort Gastrointestinal:  Denies nausea, heartburn or change in bowel habits Skin: Denies abnormal skin rashes Lymphatics: Denies new lymphadenopathy or easy bruising Neurological:Denies numbness, tingling or new weaknesses Behavioral/Psych: Mood is stable, no new changes  Extremities: No lower extremity edema Breast: Recent left lumpectomy. All other systems were reviewed with the patient and are negative.  I have reviewed the past medical history, past surgical history, social history and family history with the patient and they are unchanged from previous note.  ALLERGIES:  has No Known  Allergies.  MEDICATIONS:  Current Outpatient Prescriptions  Medication Sig Dispense Refill  . calcium carbonate (OS-CAL) 600 MG TABS Take 600 mg by mouth 2 (two) times daily with a meal.    . cholecalciferol (VITAMIN D) 1000 UNITS tablet Take 1,000 Units by mouth daily.    Marland Kitchen oxyCODONE-acetaminophen (ROXICET) 5-325 MG tablet Take 1-2 tablets by mouth every 4 (four) hours as needed. 50 tablet 0   No current facility-administered medications for this visit.    PHYSICAL EXAMINATION: ECOG PERFORMANCE STATUS: 1 - Symptomatic but completely ambulatory  Filed Vitals:   11/29/15 0847  BP: 104/64  Pulse: 87  Temp: 98.4 F (36.9 C)  Resp: 18   Filed Weights   11/29/15 0847  Weight: 139 lb 3.2 oz (63.141 kg)    GENERAL:alert, no distress and comfortable SKIN: skin color, texture, turgor are normal, no rashes or significant lesions EYES: normal, Conjunctiva are pink and non-injected, sclera clear OROPHARYNX:no exudate, no erythema and lips, buccal mucosa, and tongue normal  NECK: supple, thyroid normal size, non-tender, without nodularity LYMPH:  no palpable lymphadenopathy in the cervical, axillary or inguinal LUNGS: clear to auscultation and percussion with normal breathing effort HEART: regular rate & rhythm and no murmurs and no lower extremity edema ABDOMEN:abdomen soft, non-tender and normal bowel sounds MUSCULOSKELETAL:no cyanosis of digits and no clubbing  NEURO: alert & oriented x 3 with fluent speech, no focal motor/sensory deficits EXTREMITIES: No lower extremity edema  LABORATORY DATA:  I have reviewed the data as listed   Chemistry      Component Value Date/Time   NA 141 11/15/2015 0930   K 4.3 11/15/2015 0930   CO2 27 11/15/2015 0930  BUN 10.5 11/15/2015 0930   CREATININE 0.8 11/15/2015 0930      Component Value Date/Time   CALCIUM 9.6 11/15/2015 0930   ALKPHOS 70 11/15/2015 0930   AST 27 11/15/2015 0930   ALT 27 11/15/2015 0930   BILITOT 0.34 11/15/2015  0930       Lab Results  Component Value Date   WBC 3.9 11/15/2015   HGB 14.5 11/15/2015   HCT 42.8 11/15/2015   MCV 89.5 11/15/2015   PLT 240 11/15/2015   NEUTROABS 2.0 11/15/2015     ASSESSMENT & PLAN:  Breast cancer of upper-outer quadrant of left female breast ([place]) Lt Lumpectomy 11/22/15: IDC 1.5 cm, 0/2 LN, grade 2, with Int Grade DCIS, ER 100%, 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 I discussed the margins as well as lymph node surgeries. We also discussed the final staging along with previously performed ER/PR and HER-2/neu testing.  Recommendation:  1. Oncotype DX testing to determine if chemotherapy would be of any benefit followed by 2. Adjuvant radiation therapy followed by 3. Adjuvant antiestrogen therapy  we will call the patient with the results of Oncotype DX test.  No orders of the defined types were placed in this encounter.   The patient has a good understanding of the overall plan. she agrees with it. she will call with any problems that may develop before the next visit here.   Rulon Eisenmenger, MD 11/29/2015

## 2015-11-29 NOTE — Progress Notes (Signed)
Unable to get in to exam room prior to MD.  No assessment performed.  

## 2015-11-29 NOTE — Telephone Encounter (Signed)
Received order per Dr. Lindi Adie for oncotype testing. Requisition sent to pathology. Received by Alyse Low. PAC sent to [facility] and [employer]

## 2015-11-29 NOTE — Telephone Encounter (Signed)
Appointments made and avs printed °

## 2015-12-03 ENCOUNTER — Encounter (HOSPITAL_COMMUNITY): Payer: Self-pay

## 2015-12-06 ENCOUNTER — Telehealth: Payer: Self-pay | Admitting: *Deleted

## 2015-12-06 ENCOUNTER — Ambulatory Visit (HOSPITAL_BASED_OUTPATIENT_CLINIC_OR_DEPARTMENT_OTHER): Payer: BLUE CROSS/BLUE SHIELD | Admitting: Hematology and Oncology

## 2015-12-06 ENCOUNTER — Encounter: Payer: Self-pay | Admitting: Hematology and Oncology

## 2015-12-06 ENCOUNTER — Other Ambulatory Visit: Payer: Self-pay | Admitting: *Deleted

## 2015-12-06 ENCOUNTER — Telehealth: Payer: Self-pay | Admitting: Hematology and Oncology

## 2015-12-06 ENCOUNTER — Encounter: Payer: Self-pay | Admitting: *Deleted

## 2015-12-06 VITALS — BP 140/91 | HR 72 | Temp 98.2°F | Resp 18 | Ht 60.0 in | Wt 140.5 lb

## 2015-12-06 DIAGNOSIS — C50412 Malignant neoplasm of upper-outer quadrant of left female breast: Secondary | ICD-10-CM

## 2015-12-06 NOTE — Assessment & Plan Note (Signed)
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 Patient is at extremely high risk of recurrence if nothing further is done.  Recommendation:  1. Systemic chemotherapy with dose dense Adriamycin and Cytoxan 4 followed by Abraxane weekly 12 2. Adjuvant radiation therapy followed by 3. Adjuvant antiestrogen therapy  Chemotherapy Counseling: I discussed the risks and benefits of chemotherapy including the risks of nausea/ vomiting, risk of infection from low WBC count, fatigue due to chemo or anemia, bruising or bleeding due to low platelets, mouth sores, loss/ change in taste and decreased appetite. Liver and kidney function will be monitored through out chemotherapy as abnormalities in liver and kidney function may be a side effect of treatment. Cardiac dysfunction due to Adriamycin  was discussed in detail. Risk of permanent bone marrow dysfunction and leukemia due to chemo were also discussed.  Plan: 1. Port placement by Dr. Marlou Starks 2. Echocardiogram 3. Chemotherapy class 4. Start chemotherapy in 2 weeks  Return to clinic on day 1 of chemotherapy on 12/29/2015

## 2015-12-06 NOTE — Progress Notes (Signed)
Received oncotype results of 37/25%.  Copy to Dr. Lindi Adie and copy to medical records to scan.

## 2015-12-06 NOTE — Progress Notes (Signed)
Patient Care Team: Pcp Not In System as PCP - General  SUMMARY OF ONCOLOGIC HISTORY:   Breast cancer of upper-outer quadrant of left female breast ([place])   10/19/2015 Mammogram Screening mammogram revealed left breast asymmetry 1.1 x 0.8 x 1 cm, T1 cN0 stage IA clinical stage   11/03/2015 Initial Diagnosis Left breast biopsy 11:30 position: Invasive ductal carcinoma with DCIS, grade 2, ER 100%, PR 30%, Ki-67 90%, HER-2 negative ratio 1.44   11/22/2015 Surgery Lt Lumpectomy: IDC 1.5 cm, 0/2 LN, grade 2, with Int Grade DCIS, ER 100%, PR 30%, Her 2 Neg Ratio 1.91, T1cN0 (stage 1A)Oncotype DX score 37, 10 year ROR 25%    CHIEF COMPLIANT: Follow-up to discuss Oncotype DX score  INTERVAL HISTORY: Heather [name] is a 60 year old with above-mentioned history of left breast cancer who underwent lumpectomy and is here to discuss Oncotype DX score. Her Oncotype DX score came back at 37 which is high risk. 10 year risk of recurrence was 25%. She is here today accompanied by her husband to discuss initiation of chemotherapy.  REVIEW OF SYSTEMS:   Constitutional: Denies fevers, chills or abnormal weight loss Eyes: Denies blurriness of vision Ears, nose, mouth, throat, and face: Denies mucositis or sore throat Respiratory: Denies cough, dyspnea or wheezes Cardiovascular: Denies palpitation, chest discomfort Gastrointestinal:  Denies nausea, heartburn or change in bowel habits Skin: Denies abnormal skin rashes Lymphatics: Denies new lymphadenopathy or easy bruising Neurological:Denies numbness, tingling or new weaknesses Behavioral/Psych: Mood is stable, no new changes  Extremities: No lower extremity edema Breast:  denies any pain or lumps or nodules in either breasts All other systems were reviewed with the patient and are negative.  I have reviewed the past medical history, past surgical history, social history and family history with the patient and they are unchanged from previous  note.  ALLERGIES:  has No Known Allergies.  MEDICATIONS:  Current Outpatient Prescriptions  Medication Sig Dispense Refill  . calcium carbonate (OS-CAL) 600 MG TABS Take 600 mg by mouth 2 (two) times daily with a meal.    . cholecalciferol (VITAMIN D) 1000 UNITS tablet Take 1,000 Units by mouth daily.    Marland Kitchen oxyCODONE-acetaminophen (ROXICET) 5-325 MG tablet Take 1-2 tablets by mouth every 4 (four) hours as needed. (Patient not taking: Reported on 12/06/2015) 50 tablet 0   No current facility-administered medications for this visit.    PHYSICAL EXAMINATION: ECOG PERFORMANCE STATUS: 0 - Asymptomatic  Filed Vitals:   12/06/15 1417  BP: 140/91  Pulse: 72  Temp: 98.2 F (36.8 C)  Resp: 18   Filed Weights   12/06/15 1417  Weight: 140 lb 8 oz (63.73 kg)    GENERAL:alert, no distress and comfortable SKIN: skin color, texture, turgor are normal, no rashes or significant lesions EYES: normal, Conjunctiva are pink and non-injected, sclera clear OROPHARYNX:no exudate, no erythema and lips, buccal mucosa, and tongue normal  NECK: supple, thyroid normal size, non-tender, without nodularity LYMPH:  no palpable lymphadenopathy in the cervical, axillary or inguinal LUNGS: clear to auscultation and percussion with normal breathing effort HEART: regular rate & rhythm and no murmurs and no lower extremity edema ABDOMEN:abdomen soft, non-tender and normal bowel sounds MUSCULOSKELETAL:no cyanosis of digits and no clubbing  NEURO: alert & oriented x 3 with fluent speech, no focal motor/sensory deficits EXTREMITIES: No lower extremity edema  LABORATORY DATA:  I have reviewed the data as listed   Chemistry      Component Value Date/Time   NA 141 11/15/2015  0930   K 4.3 11/15/2015 0930   CO2 27 11/15/2015 0930   BUN 10.5 11/15/2015 0930   CREATININE 0.8 11/15/2015 0930      Component Value Date/Time   CALCIUM 9.6 11/15/2015 0930   ALKPHOS 70 11/15/2015 0930   AST 27 11/15/2015 0930   ALT  27 11/15/2015 0930   BILITOT 0.34 11/15/2015 0930       Lab Results  Component Value Date   WBC 3.9 11/15/2015   HGB 14.5 11/15/2015   HCT 42.8 11/15/2015   MCV 89.5 11/15/2015   PLT 240 11/15/2015   NEUTROABS 2.0 11/15/2015     ASSESSMENT & PLAN:  Breast cancer of upper-outer quadrant of left female breast ([place]) Lt Lumpectomy 11/22/15: IDC 1.5 cm, 0/2 LN, grade 2, with Int Grade DCIS, ER 100%, PR 30%, Her 2 Neg Ratio 1.91, T1cN0 (stage 1A), Oncotype DX recurrence score 37, 25% 10 year risk of recurrence  Oncotype DX counseling: I reviewed the test results in great detail and provided her with a copy of this report. Patient is at extremely high risk of recurrence if nothing further is done.  Recommendation:  1. Systemic chemotherapy with dose dense Adriamycin and Cytoxan 4 followed by Abraxane weekly 12 2. Adjuvant radiation therapy followed by 3. Adjuvant antiestrogen therapy  Chemotherapy Counseling: I discussed the risks and benefits of chemotherapy including the risks of nausea/ vomiting, risk of infection from low WBC count, fatigue due to chemo or anemia, bruising or bleeding due to low platelets, mouth sores, loss/ change in taste and decreased appetite. Liver and kidney function will be monitored through out chemotherapy as abnormalities in liver and kidney function may be a side effect of treatment. Cardiac dysfunction due to Adriamycin  was discussed in detail. Risk of permanent bone marrow dysfunction and leukemia due to chemo were also discussed.  Plan: 1. Port placement by Dr. Marlou Starks 2. Echocardiogram 3. Chemotherapy class 4. Start chemotherapy in 2 weeks  Return to clinic on day 1 of chemotherapy on 12/29/2015  No orders of the defined types were placed in this encounter.   The patient has a good understanding of the overall plan. she agrees with it. she will call with any problems that may develop before the next visit here.   Rulon Eisenmenger,  MD 12/06/2015

## 2015-12-06 NOTE — Telephone Encounter (Signed)
Appointments made and avs printed °

## 2015-12-06 NOTE — Telephone Encounter (Signed)
Spoke with patient and confirmed appointment for today at 215pm with Dr. Lindi Adie to discuss oncotype results.

## 2015-12-07 ENCOUNTER — Other Ambulatory Visit: Payer: Self-pay | Admitting: General Surgery

## 2015-12-13 ENCOUNTER — Other Ambulatory Visit: Payer: BLUE CROSS/BLUE SHIELD

## 2015-12-16 ENCOUNTER — Other Ambulatory Visit: Payer: Self-pay | Admitting: *Deleted

## 2015-12-22 ENCOUNTER — Encounter (HOSPITAL_COMMUNITY): Payer: Self-pay

## 2015-12-22 ENCOUNTER — Encounter (HOSPITAL_COMMUNITY)
Admission: RE | Admit: 2015-12-22 | Discharge: 2015-12-22 | Disposition: A | Discharge status: Home / Self Care | Payer: BLUE CROSS/BLUE SHIELD | Source: Ambulatory Visit | Attending: General Surgery | Admitting: General Surgery

## 2015-12-22 ENCOUNTER — Other Ambulatory Visit (HOSPITAL_COMMUNITY): Payer: Self-pay | Admitting: *Deleted

## 2015-12-22 DIAGNOSIS — C50412 Malignant neoplasm of upper-outer quadrant of left female breast: Secondary | ICD-10-CM | POA: Diagnosis not present

## 2015-12-22 DIAGNOSIS — Z09 Encounter for follow-up examination after completed treatment for conditions other than malignant neoplasm: Secondary | ICD-10-CM | POA: Diagnosis not present

## 2015-12-22 LAB — CBC
HCT: 41.9 % (ref 36.0–46.0)
Hemoglobin: 13.9 g/dL (ref 12.0–15.0)
MCH: 29.7 pg (ref 26.0–34.0)
MCHC: 33.2 g/dL (ref 30.0–36.0)
MCV: 89.5 fL (ref 78.0–100.0)
Platelets: 237 10*3/uL (ref 150–400)
RBC: 4.68 MIL/uL (ref 3.87–5.11)
RDW: 13.6 % (ref 11.5–15.5)
WBC: 5 10*3/uL (ref 4.0–10.5)

## 2015-12-22 NOTE — Pre-Procedure Instructions (Signed)
NYJA RINDT  12/22/2015      Your procedure is scheduled on Monday, December 27, 2015 at 8:30 AM.   Report to [facility] Entrance "A" Admitting Office at 6:30 AM.   Call this number if you have problems the morning of surgery: [phone]   Any questions prior to day of surgery, please call [phone] between 8 & 4 PM.   Remember:  Do not eat food or drink liquids after midnight Sunday, 12/26/15.    Do not wear jewelry, make-up or nail polish.  Do not wear lotions, powders, or perfumes.  You may wear deodorant.  Do not shave 48 hours prior to surgery.    Do not bring valuables to the hospital.  [facility] is not responsible for any belongings or valuables.  Contacts, dentures or bridgework may not be worn into surgery.  Leave your suitcase in the car.  After surgery it may be brought to your room.  For patients admitted to the hospital, discharge time will be determined by your treatment team.  Patients discharged the day of surgery will not be allowed to drive home.   Special instructions:  [place] - Preparing for Surgery  Before surgery, you can play an important role.  Because skin is not sterile, your skin needs to be as free of germs as possible.  You can reduce the number of germs on you skin by washing with CHG (chlorahexidine gluconate) soap before surgery.  CHG is an antiseptic cleaner which kills germs and bonds with the skin to continue killing germs even after washing.  Please DO NOT use if you have an allergy to CHG or antibacterial soaps.  If your skin becomes reddened/irritated stop using the CHG and inform your nurse when you arrive at Short Stay.  Do not shave (including legs and underarms) for at least 48 hours prior to the first CHG shower.  You may shave your face.  Please follow these instructions carefully:   1.  Shower with CHG Soap the night before surgery and the                                morning of Surgery.  2.  If you choose  to wash your hair, wash your hair first as usual with your       normal shampoo.  3.  After you shampoo, rinse your hair and body thoroughly to remove the                      Shampoo.  4.  Use CHG as you would any other liquid soap.  You can apply chg directly       to the skin and wash gently with scrungie or a clean washcloth.  5.  Apply the CHG Soap to your body ONLY FROM THE NECK DOWN.        Do not use on open wounds or open sores.  Avoid contact with your eyes, ears, mouth and genitals (private parts).  Wash genitals (private parts) with your normal soap.  6.  Wash thoroughly, paying special attention to the area where your surgery        will be performed.  7.  Thoroughly rinse your body with warm water from the neck down.  8.  DO NOT shower/wash with your normal soap after using and rinsing off       the  CHG Soap.  9.  Pat yourself dry with a clean towel.            10.  Wear clean pajamas.            11.  Place clean sheets on your bed the night of your first shower and do not        sleep with pets.  Day of Surgery  Do not apply any lotions the morning of surgery.  Please wear clean clothes to the hospital.   Please read over the following fact sheets that you were given. Pain Booklet, Coughing and Deep Breathing and Surgical Site Infection Prevention

## 2015-12-22 NOTE — Progress Notes (Signed)
Pt denies cardiac history, chest pain or sob. Pt understands and speaks [place] fairly well, husband speaks [place]. Spero Geralds, Marty interpreter was with pt during PAT, pt more comfortable with Spanish interpreter.

## 2015-12-23 ENCOUNTER — Ambulatory Visit (HOSPITAL_BASED_OUTPATIENT_CLINIC_OR_DEPARTMENT_OTHER)
Admission: RE | Admit: 2015-12-23 | Discharge: 2015-12-23 | Disposition: A | Discharge status: Home / Self Care | Payer: BLUE CROSS/BLUE SHIELD | Source: Ambulatory Visit | Attending: Internal Medicine | Admitting: Internal Medicine

## 2015-12-23 ENCOUNTER — Encounter (HOSPITAL_COMMUNITY): Payer: Self-pay | Admitting: Internal Medicine

## 2015-12-23 ENCOUNTER — Ambulatory Visit (HOSPITAL_COMMUNITY)
Admission: RE | Admit: 2015-12-23 | Discharge: 2015-12-23 | Disposition: A | Discharge status: Home / Self Care | Payer: BLUE CROSS/BLUE SHIELD | Source: Ambulatory Visit | Attending: Hematology and Oncology | Admitting: Hematology and Oncology

## 2015-12-23 VITALS — BP 128/80 | HR 72 | Wt 138.8 lb

## 2015-12-23 DIAGNOSIS — C50412 Malignant neoplasm of upper-outer quadrant of left female breast: Secondary | ICD-10-CM | POA: Diagnosis not present

## 2015-12-23 DIAGNOSIS — Z09 Encounter for follow-up examination after completed treatment for conditions other than malignant neoplasm: Secondary | ICD-10-CM | POA: Insufficient documentation

## 2015-12-23 NOTE — Progress Notes (Signed)
  Echocardiogram 2D Echocardiogram has been performed.  Heather [name] 12/23/2015, 2:52 PM

## 2015-12-23 NOTE — Progress Notes (Signed)
Patient ID: Heather [name], female   DOB: Apr 16, 1956, 60 y.o.   MRN: [id number]    Cardiology Oncology Clinic Note   Referring Physician: Dr. Lindi Adie Primary Care: Dr Karle Starch Endocrinologist: Dr Toney Rakes  Primary Cardiologist: None  HPI: Heather [name] is a 60 y.o. who presents today to establish in the cardio-oncology clinic.  She was recently diagnosed with left breast cancer after routine mammography s/p Left lumpectomy.   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  Prior to this diagnosis she was in good health. She feels good overall. Has no symptoms. No CP. No SOB, orthopnea, PND, lightheadedness, or dizziness. She does have a distant history of an episode of vertigo. She takes no medications on a regular basis.  Family cardiac history is relatively unremarkable apart from brother and sister with HTN.   Echo: 12/23/15 LVEF 55-60% Lat s' 10.7 cm/sec GLS -19.1  Social: Lives in [place] with husband. Does not work, but occasionally volunteers.   Review of Systems: [y] = yes, [ ]  = no   General: Weight gain [ ] ; Weight loss [ ] ; Anorexia [ ] ; Fatigue [ ] ; Fever [ ] ; Chills [ ] ; Weakness [ ]   Cardiac: Chest pain/pressure [ ] ; Resting SOB [ ] ; Exertional SOB [ ] ; Orthopnea [ ] ; Pedal Edema [ ] ; Palpitations [ ] ; Syncope [ ] ; Presyncope [ ] ; Paroxysmal nocturnal dyspnea[ ]   Pulmonary: Cough [ ] ; Wheezing[ ] ; Hemoptysis[ ] ; Sputum [ ] ; Snoring [ ]   GI: Vomiting[ ] ; Dysphagia[ ] ; Melena[ ] ; Hematochezia [ ] ; Heartburn[ ] ; Abdominal pain [ ] ; Constipation [ ] ; Diarrhea [ ] ; BRBPR [ ]   GU: Hematuria[ ] ; Dysuria [ ] ; Nocturia[ ]   Vascular: Pain in legs with walking [ ] ; Pain in feet with lying flat [ ] ; Non-healing sores [ ] ; Stroke [ ] ; TIA [ ] ; Slurred speech [ ] ;  Neuro: Headaches[ ] ; Vertigo[y]; Seizures[ ] ; Paresthesias[  ];Blurred vision [ ] ; Diplopia [ ] ; Vision changes [ ]   Ortho/Skin: Arthritis [ ] ; Joint pain [ ] ; Muscle pain [ ] ; Joint swelling [ ] ; Back Pain [ ] ; Rash [ ]   Psych: Depression[ ] ; Anxiety[ ]   Heme: Bleeding problems [ ] ; Clotting disorders [ ] ; Anemia [ ]   Endocrine: Diabetes [ ] ; Thyroid dysfunction[ ]    Past Medical History  Diagnosis Date  . NSVD (normal spontaneous vaginal delivery)   . Cancer [facility])     left breast cancer DCIS  . GERD (gastroesophageal reflux disease)     not recently  . PONV (postoperative nausea and vomiting)     Current Outpatient Prescriptions  Medication Sig Dispense Refill  . Ascorbic Acid (VITAMIN C) 1000 MG tablet Take 1,000 mg by mouth daily.    . calcium carbonate (OS-CAL) 600 MG TABS Take 600 mg by mouth 2 (two) times daily with a meal.    . cholecalciferol (VITAMIN D) 1000 UNITS tablet Take 1,000 Units by mouth daily.    Marland Kitchen VITAMIN E COMPLEX PO Take by mouth.     No current facility-administered medications for this encounter.    No Known Allergies    Social History   Social History  . Marital Status: Married    Spouse Name: N/A  . Number of Children: N/A  . Years of Education: N/A   Occupational History  .  Not on file.   Social History Main Topics  . Smoking status: Never Smoker   . Smokeless tobacco: Never Used  . Alcohol Use: 0.0 oz/week    0 Standard drinks or equivalent per week     Comment: occ  . Drug Use: No  . Sexual Activity: Yes    Birth Control/ Protection: Surgical     Comment: hyst, intercourse age 39, less than 5 sexual partners   Other Topics Concern  . Not on file   Social History Narrative      Family History  Problem Relation Age of Onset  . Diabetes Brother   . Hypertension Brother   . Ulcers Mother   . Cirrhosis Father   . Hypertension Sister     Danley Danker Vitals:   12/23/15 1454  BP: 128/80  Pulse: 72  Weight: 138 lb 12 oz (62.937 kg)  SpO2: 100%    PHYSICAL EXAM: General:  Well  appearing. No respiratory difficulty HEENT: normal Neck: supple. no JVD appreciated. Carotids 2+ bilat; no bruits. No thyromegaly or nodule noted.  Cor: PMI nondisplaced. RRR. No M/G/R Lungs: CTAB, normal effort.  Abdomen: soft, NT, ND, no HSM. No bruits or masses. +BS  Extremities: no cyanosis, clubbing, rash, edema Neuro: alert & oriented x 3, cranial nerves grossly intact. moves all 4 extremities w/o difficulty. Affect pleasant.   ASSESSMENT & PLAN:  1. Breast Cancer, Left  - to get port-a-cath placed next week for chemo.  - Planning on Adriamycin and Cytoxan x 4 followed by Abraxane weekly x 12.  - Day 1 of chemo scheduled for 12/29/15.  Return 3 months with Echo.   Legrand Como [address] [place], PA-C 12/23/2015 3:40 PM   Patient seen and examined with Oda Kilts, PA-C. We discussed all aspects of the encounter. I agree with the assessment and plan as stated above.   Echo reviewed personally. Explained incidence of adriamycin cardiotoxicity and role of Cardio-oncology clinic at length. Echo images reviewed personally. All parameters stable. Reviewed signs and symptoms of HF to look for. Proceed with adriamycin therapy. Follow-up with echo in 3 months.  Stclair Szymborski,MD 3:57 PM

## 2015-12-23 NOTE — Addendum Note (Signed)
Encounter addended by: Scarlette Calico, RN on: 12/23/2015  4:01 PM<BR>     Documentation filed: Patient Instructions Section

## 2015-12-23 NOTE — Patient Instructions (Signed)
Your physician recommends that you schedule a follow-up appointment in: 3 months with echocardiogram  

## 2015-12-26 MED ORDER — CEFAZOLIN SODIUM-DEXTROSE 2-3 GM-% IV SOLR
2.0000 g | INTRAVENOUS | Status: AC
  Administered 2015-12-27: 2 g via INTRAVENOUS
  Filled 2015-12-26: qty 50

## 2015-12-27 ENCOUNTER — Ambulatory Visit (HOSPITAL_COMMUNITY): Payer: BLUE CROSS/BLUE SHIELD | Admitting: Anesthesiology

## 2015-12-27 ENCOUNTER — Encounter (HOSPITAL_COMMUNITY)
Admission: RE | Disposition: A | Discharge status: Home / Self Care | Payer: Self-pay | Source: Ambulatory Visit | Attending: General Surgery

## 2015-12-27 ENCOUNTER — Ambulatory Visit (HOSPITAL_COMMUNITY): Payer: BLUE CROSS/BLUE SHIELD

## 2015-12-27 ENCOUNTER — Encounter (HOSPITAL_COMMUNITY): Payer: Self-pay | Admitting: Surgery

## 2015-12-27 ENCOUNTER — Ambulatory Visit (HOSPITAL_COMMUNITY)
Admission: RE | Admit: 2015-12-27 | Discharge: 2015-12-27 | Disposition: A | Discharge status: Home / Self Care | Payer: BLUE CROSS/BLUE SHIELD | Source: Ambulatory Visit | Attending: General Surgery | Admitting: General Surgery

## 2015-12-27 DIAGNOSIS — Z95828 Presence of other vascular implants and grafts: Secondary | ICD-10-CM

## 2015-12-27 DIAGNOSIS — C50212 Malignant neoplasm of upper-inner quadrant of left female breast: Secondary | ICD-10-CM | POA: Insufficient documentation

## 2015-12-27 DIAGNOSIS — Z419 Encounter for procedure for purposes other than remedying health state, unspecified: Secondary | ICD-10-CM

## 2015-12-27 DIAGNOSIS — Z90710 Acquired absence of both cervix and uterus: Secondary | ICD-10-CM | POA: Diagnosis not present

## 2015-12-27 HISTORY — PX: PORTACATH PLACEMENT: SHX2246

## 2015-12-27 LAB — NO BLOOD PRODUCTS

## 2015-12-27 SURGERY — INSERTION, TUNNELED CENTRAL VENOUS DEVICE, WITH PORT
Anesthesia: General | Site: Chest | Laterality: Right

## 2015-12-27 MED ORDER — BUPIVACAINE HCL (PF) 0.25 % IJ SOLN
INTRAMUSCULAR | Status: AC
  Filled 2015-12-27: qty 30

## 2015-12-27 MED ORDER — PROPOFOL 10 MG/ML IV BOLUS
INTRAVENOUS | Status: DC | PRN
  Administered 2015-12-27: 140 mg via INTRAVENOUS

## 2015-12-27 MED ORDER — HEPARIN SODIUM (PORCINE) 5000 UNIT/ML IJ SOLN
INTRAMUSCULAR | Status: DC | PRN
  Administered 2015-12-27: 08:00:00

## 2015-12-27 MED ORDER — LIDOCAINE HCL (CARDIAC) 20 MG/ML IV SOLN
INTRAVENOUS | Status: DC | PRN
  Administered 2015-12-27: 60 mg via INTRAVENOUS

## 2015-12-27 MED ORDER — FENTANYL CITRATE (PF) 250 MCG/5ML IJ SOLN
INTRAMUSCULAR | Status: AC
  Filled 2015-12-27: qty 5

## 2015-12-27 MED ORDER — BUPIVACAINE HCL (PF) 0.25 % IJ SOLN
INTRAMUSCULAR | Status: DC | PRN
  Administered 2015-12-27: 30 mL

## 2015-12-27 MED ORDER — DIPHENHYDRAMINE HCL 50 MG/ML IJ SOLN
INTRAMUSCULAR | Status: DC | PRN
  Administered 2015-12-27: 12.5 mg via INTRAVENOUS

## 2015-12-27 MED ORDER — FENTANYL CITRATE (PF) 100 MCG/2ML IJ SOLN
INTRAMUSCULAR | Status: AC
  Filled 2015-12-27: qty 2

## 2015-12-27 MED ORDER — LIDOCAINE HCL (CARDIAC) 20 MG/ML IV SOLN
INTRAVENOUS | Status: AC
  Filled 2015-12-27: qty 5

## 2015-12-27 MED ORDER — PHENYLEPHRINE HCL 10 MG/ML IJ SOLN
INTRAMUSCULAR | Status: DC | PRN
Start: 2015-12-27 — End: 2015-12-27
  Administered 2015-12-27 (×2): 40 ug via INTRAVENOUS

## 2015-12-27 MED ORDER — CHLORHEXIDINE GLUCONATE 4 % EX LIQD: 1.0000 "application " | Freq: Once | CUTANEOUS | Status: DC

## 2015-12-27 MED ORDER — FENTANYL CITRATE (PF) 100 MCG/2ML IJ SOLN
25.0000 ug | INTRAMUSCULAR | Status: DC | PRN
  Administered 2015-12-27 (×2): 50 ug via INTRAVENOUS

## 2015-12-27 MED ORDER — DEXAMETHASONE SODIUM PHOSPHATE 4 MG/ML IJ SOLN
INTRAMUSCULAR | Status: AC
  Filled 2015-12-27: qty 1

## 2015-12-27 MED ORDER — LACTATED RINGERS IV SOLN
INTRAVENOUS | Status: DC
  Administered 2015-12-27: 07:00:00 via INTRAVENOUS

## 2015-12-27 MED ORDER — FENTANYL CITRATE (PF) 100 MCG/2ML IJ SOLN
INTRAMUSCULAR | Status: DC | PRN
  Administered 2015-12-27: 50 ug via INTRAVENOUS

## 2015-12-27 MED ORDER — DIPHENHYDRAMINE HCL 50 MG/ML IJ SOLN
INTRAMUSCULAR | Status: AC
  Filled 2015-12-27: qty 1

## 2015-12-27 MED ORDER — MIDAZOLAM HCL 2 MG/2ML IJ SOLN
INTRAMUSCULAR | Status: AC
  Filled 2015-12-27: qty 2

## 2015-12-27 MED ORDER — 0.9 % SODIUM CHLORIDE (POUR BTL) OPTIME
TOPICAL | Status: DC | PRN
  Administered 2015-12-27: 1000 mL

## 2015-12-27 MED ORDER — DEXAMETHASONE SODIUM PHOSPHATE 4 MG/ML IJ SOLN
INTRAMUSCULAR | Status: DC | PRN
  Administered 2015-12-27: 4 mg via INTRAVENOUS

## 2015-12-27 MED ORDER — ONDANSETRON HCL 4 MG/2ML IJ SOLN
INTRAMUSCULAR | Status: AC
  Filled 2015-12-27: qty 2

## 2015-12-27 MED ORDER — METOCLOPRAMIDE HCL 5 MG/ML IJ SOLN
INTRAMUSCULAR | Status: DC | PRN
  Administered 2015-12-27: 10 mg via INTRAVENOUS

## 2015-12-27 MED ORDER — HEPARIN SOD (PORK) LOCK FLUSH 100 UNIT/ML IV SOLN
INTRAVENOUS | Status: AC
  Filled 2015-12-27: qty 5

## 2015-12-27 MED ORDER — LACTATED RINGERS IV SOLN
INTRAVENOUS | Status: DC | PRN
  Administered 2015-12-27: 09:00:00 via INTRAVENOUS

## 2015-12-27 MED ORDER — PROPOFOL 10 MG/ML IV BOLUS
INTRAVENOUS | Status: AC
  Filled 2015-12-27: qty 20

## 2015-12-27 MED ORDER — HEPARIN SOD (PORK) LOCK FLUSH 100 UNIT/ML IV SOLN
INTRAVENOUS | Status: DC | PRN
  Administered 2015-12-27: 500 [IU] via INTRAVENOUS

## 2015-12-27 MED ORDER — MIDAZOLAM HCL 5 MG/5ML IJ SOLN
INTRAMUSCULAR | Status: DC | PRN
  Administered 2015-12-27: 2 mg via INTRAVENOUS

## 2015-12-27 MED ORDER — OXYCODONE-ACETAMINOPHEN 5-325 MG PO TABS: 1.0000 | ORAL_TABLET | ORAL | Status: DC | PRN

## 2015-12-27 MED ORDER — ONDANSETRON HCL 4 MG/2ML IJ SOLN
INTRAMUSCULAR | Status: DC | PRN
  Administered 2015-12-27: 4 mg via INTRAVENOUS

## 2015-12-27 MED ORDER — EPHEDRINE SULFATE 50 MG/ML IJ SOLN
INTRAMUSCULAR | Status: DC | PRN
  Administered 2015-12-27 (×3): 10 mg via INTRAVENOUS

## 2015-12-27 MED ORDER — METOCLOPRAMIDE HCL 5 MG/ML IJ SOLN
INTRAMUSCULAR | Status: AC
  Filled 2015-12-27: qty 2

## 2015-12-27 SURGICAL SUPPLY — 48 items
BAG DECANTER FOR FLEXI CONT (MISCELLANEOUS) ×2 IMPLANT
BLADE SURG 15 STRL LF DISP TIS (BLADE) ×1 IMPLANT
BLADE SURG 15 STRL SS (BLADE) ×1
CHLORAPREP W/TINT 10.5 ML (MISCELLANEOUS) ×2 IMPLANT
COVER SURGICAL LIGHT HANDLE (MISCELLANEOUS) ×2 IMPLANT
COVER TRANSDUCER ULTRASND GEL (DRAPE) IMPLANT
CRADLE DONUT ADULT HEAD (MISCELLANEOUS) ×2 IMPLANT
DECANTER SPIKE VIAL GLASS SM (MISCELLANEOUS) ×2 IMPLANT
DRAPE C-ARM 42X72 X-RAY (DRAPES) ×2 IMPLANT
DRAPE CHEST BREAST 15X10 FENES (DRAPES) ×2 IMPLANT
DRAPE UTILITY XL STRL (DRAPES) ×2 IMPLANT
ELECT CAUTERY BLADE 6.4 (BLADE) ×2 IMPLANT
ELECT REM PT RETURN 9FT ADLT (ELECTROSURGICAL) ×2
ELECTRODE REM PT RTRN 9FT ADLT (ELECTROSURGICAL) ×1 IMPLANT
GAUZE SPONGE 4X4 16PLY XRAY LF (GAUZE/BANDAGES/DRESSINGS) ×2 IMPLANT
GEL ULTRASOUND 20GR AQUASONIC (MISCELLANEOUS) IMPLANT
GLOVE BIO SURGEON STRL SZ7.5 (GLOVE) ×2 IMPLANT
GLOVE BIOGEL PI IND STRL 7.0 (GLOVE) ×1 IMPLANT
GLOVE BIOGEL PI INDICATOR 7.0 (GLOVE) ×1
GLOVE SURG SS PI 7.0 STRL IVOR (GLOVE) ×2 IMPLANT
GOWN STRL REUS W/ TWL LRG LVL3 (GOWN DISPOSABLE) ×2 IMPLANT
GOWN STRL REUS W/TWL LRG LVL3 (GOWN DISPOSABLE) ×2
INTRODUCER COOK 11FR (CATHETERS) IMPLANT
KIT BASIN OR (CUSTOM PROCEDURE TRAY) ×2 IMPLANT
KIT PORT POWER 8FR ISP CVUE (Catheter) ×2 IMPLANT
KIT ROOM TURNOVER OR (KITS) ×2 IMPLANT
LIQUID BAND (GAUZE/BANDAGES/DRESSINGS) ×2 IMPLANT
NEEDLE 22X1 1/2 (OR ONLY) (NEEDLE) IMPLANT
NEEDLE HYPO 25GX1X1/2 BEV (NEEDLE) ×2 IMPLANT
NS IRRIG 1000ML POUR BTL (IV SOLUTION) ×2 IMPLANT
PACK SURGICAL SETUP 50X90 (CUSTOM PROCEDURE TRAY) ×2 IMPLANT
PAD ARMBOARD 7.5X6 YLW CONV (MISCELLANEOUS) ×2 IMPLANT
PENCIL BUTTON HOLSTER BLD 10FT (ELECTRODE) ×2 IMPLANT
SET INTRODUCER 12FR PACEMAKER (SHEATH) IMPLANT
SET SHEATH INTRODUCER 10FR (MISCELLANEOUS) IMPLANT
SHEATH COOK PEEL AWAY SET 9F (SHEATH) IMPLANT
SUT MNCRL AB 4-0 PS2 18 (SUTURE) ×2 IMPLANT
SUT PROLENE 2 0 SH 30 (SUTURE) ×4 IMPLANT
SUT SILK 2 0 (SUTURE)
SUT SILK 2-0 18XBRD TIE 12 (SUTURE) IMPLANT
SUT VIC AB 3-0 SH 27 (SUTURE) ×1
SUT VIC AB 3-0 SH 27XBRD (SUTURE) ×1 IMPLANT
SYR 20ML ECCENTRIC (SYRINGE) ×4 IMPLANT
SYR 5ML LUER SLIP (SYRINGE) ×2 IMPLANT
SYR CONTROL 10ML LL (SYRINGE) ×2 IMPLANT
SYRINGE 10CC LL (SYRINGE) IMPLANT
TOWEL OR 17X24 6PK STRL BLUE (TOWEL DISPOSABLE) IMPLANT
TOWEL OR 17X26 10 PK STRL BLUE (TOWEL DISPOSABLE) ×2 IMPLANT

## 2015-12-27 NOTE — Op Note (Signed)
12/27/2015  9:35 AM  PATIENT:  Heather [name]  60 y.o. female  PRE-OPERATIVE DIAGNOSIS:  LEFT BREAST CANCER  POST-OPERATIVE DIAGNOSIS:  LEFT BREAST CANCER  PROCEDURE:  Procedure(s): INSERTION PORT-A-CATH (Right)  SURGEON:  Surgeon(s) and Role:    * Jovita Kussmaul, MD - Primary  PHYSICIAN ASSISTANT:   ASSISTANTS: none   ANESTHESIA:   general  EBL:     BLOOD ADMINISTERED:none  DRAINS: none   LOCAL MEDICATIONS USED:  MARCAINE     SPECIMEN:  No Specimen  DISPOSITION OF SPECIMEN:  N/A  COUNTS:  YES  TOURNIQUET:  * No tourniquets in log *  DICTATION: .Dragon Dictation   After informed consent was obtained patient was brought to the operating room and placed in the supine position on the operating room table. After adequate induction general anesthesia A roll was placed between the patient's shoulder blades to extend the shoulder slightly. The right chest and neck area were then prepped with ChloraPrep, allowed to dry, and draped in usual sterile manner. The patient was placed in Trendelenburg position. The area lateral to the bend of the clavicle and the right chest wall was infiltrated with quarter percent Marcaine. A small incision was made in this area with a 15 blade knife. A large bore needle from the Port-A-Cath kit was used to slide beneath the bend of the clavicle heading towards the sternal margin and in doing so I was able to access the right subclavian vein without difficulty. A wire was fed through the needle using the Seldinger technique without difficulty. The wire was confirmed in the central venous system using real-time fluoroscopy. The Incision on the right chest wall was extended slightly. A subcutaneous pocket was created inferior to the incision using blunt finger dissection and some sharp dissection with electrocautery. The Tubing was placed on the reservoir and the reservoir was placed in the pocket. The length of the tubing was again estimated using real-time  fluoroscopy and cut to the appropriate length. Next the sheath and dilator were fed over the wire using the Seldinger technique without difficulty. The dilator and wire were removed. The tubing was fed through the sheath as far as it would go and then held in place while the sheath was gently cracked and separated. Another real-time fluoroscopy image showed the tip of the catheter to be in the distal superior vena cava. The tubing was then permanently anchored to the reservoir. The reservoir was anchored in the pocket with 2 2-0 Prolene stitches. The port was then aspirated and aspirated blood easily. It was then flushed initially with a dilute heparin solution and then with a more concentrated heparin solution. The subcutaneous tissue was then closed over the port with interrupted 3-0 Vicryl stitches. The skin was then closed with a running 4-0 Monocryl subcuticular stitch. Dermabond dressings were applied. The patient tolerated the procedure well. At the end of the case all needle sponge and instrument counts were correct. The patient was then awakened and taken to recovery in stable condition.  PLAN OF CARE: Discharge to home after PACU  PATIENT DISPOSITION:  PACU - hemodynamically stable.   Delay start of Pharmacological VTE agent (>24hrs) due to surgical blood loss or risk of bleeding: not applicable

## 2015-12-27 NOTE — Anesthesia Procedure Notes (Signed)
Procedure Name: LMA Insertion Date/Time: 12/27/2015 8:43 AM Performed by: Manus Gunning, Twisha Vanpelt J Pre-anesthesia Checklist: Patient identified, Timeout performed, Emergency Drugs available, Suction available and Patient being monitored Patient Re-evaluated:Patient Re-evaluated prior to inductionOxygen Delivery Method: Circle system utilized Preoxygenation: Pre-oxygenation with 100% oxygen Intubation Type: IV induction Ventilation: Mask ventilation without difficulty LMA Size: 3.0 Number of attempts: 1 Placement Confirmation: positive ETCO2 and breath sounds checked- equal and bilateral Tube secured with: Tape

## 2015-12-27 NOTE — Anesthesia Preprocedure Evaluation (Addendum)
Anesthesia Evaluation  Patient identified by MRN, date of birth, ID band Patient awake    Reviewed: Allergy & Precautions, H&P , NPO status , Patient's Chart, lab work & pertinent test results  History of Anesthesia Complications (+) PONV  Airway Mallampati: II  TM Distance: >3 FB Neck ROM: Full    Dental no notable dental hx. (+) Teeth Intact, Dental Advisory Given   Pulmonary neg pulmonary ROS,    Pulmonary exam normal breath sounds clear to auscultation       Cardiovascular negative cardio ROS   Rhythm:Regular Rate:Normal     Neuro/Psych negative neurological ROS  negative psych ROS   GI/Hepatic negative GI ROS, Neg liver ROS,   Endo/Other  negative endocrine ROS  Renal/GU negative Renal ROS  negative genitourinary   Musculoskeletal   Abdominal   Peds  Hematology negative hematology ROS (+) JEHOVAH'S WITNESS  Anesthesia Other Findings   Reproductive/Obstetrics negative OB ROS                            Anesthesia Physical Anesthesia Plan  ASA: II  Anesthesia Plan: General   Post-op Pain Management:    Induction: Intravenous  Airway Management Planned: LMA  Additional Equipment:   Intra-op Plan:   Post-operative Plan: Extubation in OR  Informed Consent: I have reviewed the patients History and Physical, chart, labs and discussed the procedure including the risks, benefits and alternatives for the proposed anesthesia with the patient or authorized representative who has indicated his/her understanding and acceptance.   Dental advisory given  Plan Discussed with: CRNA  Anesthesia Plan Comments:         Anesthesia Quick Evaluation

## 2015-12-27 NOTE — Transfer of Care (Signed)
Immediate Anesthesia Transfer of Care Note  Patient: Heather [name]  Procedure(s) Performed: Procedure(s): INSERTION PORT-A-CATH (Right)  Patient Location: PACU  Anesthesia Type:General  Level of Consciousness: awake  Airway & Oxygen Therapy: Patient Spontanous Breathing  Post-op Assessment: Report given to RN and Post -op Vital signs reviewed and stable  Post vital signs: Reviewed and stable  Last Vitals:  Filed Vitals:   12/27/15 0711  BP: 122/76  Pulse: 65  Temp: 36.5 C  Resp: 18    Complications: No apparent anesthesia complications

## 2015-12-27 NOTE — Anesthesia Postprocedure Evaluation (Signed)
Anesthesia Post Note  Patient: Heather [name]  Procedure(s) Performed: Procedure(s) (LRB): INSERTION PORT-A-CATH (Right)  Patient location during evaluation: PACU Anesthesia Type: General Level of consciousness: awake and alert Pain management: pain level controlled Vital Signs Assessment: post-procedure vital signs reviewed and stable Respiratory status: spontaneous breathing, nonlabored ventilation and respiratory function stable Cardiovascular status: blood pressure returned to baseline and stable Postop Assessment: no signs of nausea or vomiting Anesthetic complications: no    Last Vitals:  Filed Vitals:   12/27/15 1015 12/27/15 1038  BP: 117/70 104/68  Pulse: 83 86  Temp:    Resp: 15 16    Last Pain:  Filed Vitals:   12/27/15 1051  PainSc: 5                  Myria Steenbergen,W. EDMOND

## 2015-12-27 NOTE — H&P (Signed)
Heather [name]  Location: [facility] Patient #: [id number] DOB: [birth year] Married / Language: Spanish / Race: Undefined Female   History of Present Illness  Patient words: po lumpectomy.  The patient is a 60 year old female who presents for a follow-up for Breast cancer. The patient is a 60 year old Hispanic female who is about 3 weeks status post left breast lumpectomy and sentinel node mapping for a T1 CN 0 left breast cancer. She was ER and PR positive and HER-2 negative with a Ki-67 of 90%. She tolerated the surgery well. She denies any significant pain. She is being offered chemotherapy and will need a Port-A-Cath.   Problem List/Past Medical  BREAST CANCER OF UPPER-INNER QUADRANT OF LEFT FEMALE BREAST (C50.212)  Other Problems Breast Cancer  Past Surgical History Cesarean Section - 1 Colon Polyp Removal - Colonoscopy Colon Polyp Removal - Open Hysterectomy (not due to cancer) - Partial  Diagnostic Studies History Colonoscopy within last year Mammogram within last year  Allergies No Known Drug Allergies01/06/2016  Medication History  No Current Medications Medications Reconciled  Social History  Alcohol use Occasional alcohol use. Caffeine use Coffee. No drug use Tobacco use Never smoker.  Family History  Kidney Disease Brother, Sister. Thyroid problems Sister.  Pregnancy / Birth History Age at menarche 12 years. Gravida 2 Irregular periods Maternal age 59-20 Para 2    Review of Systems  General Not Present- Appetite Loss, Chills, Fatigue, Fever, Night Sweats, Weight Gain and Weight Loss. Skin Not Present- Change in Wart/Mole, Dryness, Hives, Jaundice, New Lesions, Non-Healing Wounds, Rash and Ulcer. HEENT Not Present- Earache, Hearing Loss, Hoarseness, Nose Bleed, Oral Ulcers, Ringing in the Ears, Seasonal Allergies, Sinus Pain, Sore Throat, Visual Disturbances, Wears glasses/contact lenses and Yellow  Eyes. Respiratory Not Present- Bloody sputum, Chronic Cough, Difficulty Breathing, Snoring and Wheezing. Breast Not Present- Breast Mass, Breast Pain, Nipple Discharge and Skin Changes. Cardiovascular Not Present- Chest Pain, Difficulty Breathing Lying Down, Leg Cramps, Palpitations, Rapid Heart Rate, Shortness of Breath and Swelling of Extremities. Gastrointestinal Not Present- Abdominal Pain, Bloating, Bloody Stool, Change in Bowel Habits, Chronic diarrhea, Constipation, Difficulty Swallowing, Excessive gas, Gets full quickly at meals, Hemorrhoids, Indigestion, Nausea, Rectal Pain and Vomiting. Female Genitourinary Not Present- Frequency, Nocturia, Painful Urination, Pelvic Pain and Urgency. Musculoskeletal Not Present- Back Pain, Joint Pain, Joint Stiffness, Muscle Pain, Muscle Weakness and Swelling of Extremities. Neurological Not Present- Decreased Memory, Fainting, Headaches, Numbness, Seizures, Tingling, Tremor, Trouble walking and Weakness. Psychiatric Not Present- Anxiety, Bipolar, Change in Sleep Pattern, Depression, Fearful and Frequent crying. Endocrine Not Present- Cold Intolerance, Excessive Hunger, Hair Changes, Heat Intolerance, Hot flashes and New Diabetes. Hematology Not Present- Easy Bruising, Excessive bleeding, Gland problems, HIV and Persistent Infections.  Vitals  Weight: 138.38 lb Height: 57in Body Surface Area: 1.54 m Body Mass Index: 29.94 kg/m  BP: 116/82 (Sitting, Left Arm, Standard)       Physical Exam  General Mental Status-Alert. General Appearance-Consistent with stated age. Hydration-Well hydrated. Voice-Normal.  Head and Neck Head-normocephalic, atraumatic with no lesions or palpable masses. Trachea-midline. Thyroid Gland Characteristics - normal size and consistency.  Eye Eyeball - Bilateral-Extraocular movements intact. Sclera/Conjunctiva - Bilateral-No scleral icterus.  Chest and Lung Exam Chest and lung exam  reveals -quiet, even and easy respiratory effort with no use of accessory muscles and on auscultation, normal breath sounds, no adventitious sounds and normal vocal resonance. Inspection Chest Wall - Normal. Back - normal.  Breast Note: The left breast and axillary incisions are healing nicely with  no sign of infection or seroma.   Cardiovascular Cardiovascular examination reveals -normal heart sounds, regular rate and rhythm with no murmurs and normal pedal pulses bilaterally.  Abdomen Inspection Inspection of the abdomen reveals - No Hernias. Skin - Scar - no surgical scars. Palpation/Percussion Palpation and Percussion of the abdomen reveal - Soft, Non Tender, No Rebound tenderness, No Rigidity (guarding) and No hepatosplenomegaly. Auscultation Auscultation of the abdomen reveals - Bowel sounds normal.  Neurologic Neurologic evaluation reveals -alert and oriented x 3 with no impairment of recent or remote memory. Mental Status-Normal.  Musculoskeletal Normal Exam - Left-Upper Extremity Strength Normal and Lower Extremity Strength Normal. Normal Exam - Right-Upper Extremity Strength Normal and Lower Extremity Strength Normal.  Lymphatic Head & Neck  General Head & Neck Lymphatics: Bilateral - Description - Normal. Axillary  General Axillary Region: Bilateral - Description - Normal. Tenderness - Non Tender. Femoral & Inguinal  Generalized Femoral & Inguinal Lymphatics: Bilateral - Description - Normal. Tenderness - Non Tender.    Assessment & Plan  BREAST CANCER OF UPPER-INNER QUADRANT OF LEFT FEMALE BREAST (C50.212) Impression: The patient is 3 weeks status post left breast lumpectomy for breast cancer. She is healing well. She is scheduled for a Port-A-Cath in about 2 weeks. I have discussed with her in detail the risks and benefits of the operation to place the Port-A-Cath as well as some of the technical aspects and she understands and wishes to  proceed Current Plans Follow up with [place] in the office in 3 MONTHS.  Call us sooner as needed.    Signed by Luella Cook, MD

## 2015-12-27 NOTE — Interval H&P Note (Signed)
History and Physical Interval Note:  12/27/2015 8:26 AM  Heather [name]  has presented today for surgery, with the diagnosis of LEFT BREAST CANCER  The various methods of treatment have been discussed with the patient and family. After consideration of risks, benefits and other options for treatment, the patient has consented to  Procedure(s): INSERTION PORT-A-CATH (N/A) as a surgical intervention .  The patient's history has been reviewed, patient examined, no change in status, stable for surgery.  I have reviewed the patient's chart and labs.  Questions were answered to the patient's satisfaction.     TOTH III,Livvy Spilman S

## 2015-12-27 NOTE — Progress Notes (Signed)
Interpreter Lesle Chris for Pre op Short Stay

## 2015-12-28 ENCOUNTER — Encounter (HOSPITAL_COMMUNITY): Payer: Self-pay | Admitting: General Surgery

## 2015-12-28 NOTE — Assessment & Plan Note (Signed)
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 Systemic chemotherapy with dose dense Adriamycin and Cytoxan 4 followed by Abraxane weekly 12 2. Adjuvant radiation therapy followed by 3. Adjuvant antiestrogen therapy -------------------------------------------------------------------------------------------------------------------------- Current Treatment: Cycle 1 day 1 of Dose Dense Adriamycin and Cytoxan ECHO: 12/23/15: EF 55-60% Anti-emetics were reviewed.  RTC in 1 week for toxicity check

## 2015-12-29 ENCOUNTER — Ambulatory Visit (HOSPITAL_BASED_OUTPATIENT_CLINIC_OR_DEPARTMENT_OTHER): Payer: BLUE CROSS/BLUE SHIELD

## 2015-12-29 ENCOUNTER — Encounter: Payer: Self-pay | Admitting: *Deleted

## 2015-12-29 ENCOUNTER — Other Ambulatory Visit: Payer: Self-pay | Admitting: *Deleted

## 2015-12-29 ENCOUNTER — Encounter: Payer: Self-pay | Admitting: Hematology and Oncology

## 2015-12-29 ENCOUNTER — Other Ambulatory Visit (HOSPITAL_BASED_OUTPATIENT_CLINIC_OR_DEPARTMENT_OTHER): Payer: BLUE CROSS/BLUE SHIELD

## 2015-12-29 ENCOUNTER — Ambulatory Visit (HOSPITAL_BASED_OUTPATIENT_CLINIC_OR_DEPARTMENT_OTHER): Payer: BLUE CROSS/BLUE SHIELD | Admitting: Hematology and Oncology

## 2015-12-29 ENCOUNTER — Telehealth: Payer: Self-pay | Admitting: Hematology and Oncology

## 2015-12-29 VITALS — BP 131/76 | HR 79 | Temp 97.9°F | Resp 18 | Ht 59.0 in | Wt 137.9 lb

## 2015-12-29 DIAGNOSIS — C50412 Malignant neoplasm of upper-outer quadrant of left female breast: Secondary | ICD-10-CM

## 2015-12-29 DIAGNOSIS — Z5111 Encounter for antineoplastic chemotherapy: Secondary | ICD-10-CM

## 2015-12-29 LAB — COMPREHENSIVE METABOLIC PANEL
ALT: 21 U/L (ref 0–55)
AST: 22 U/L (ref 5–34)
Albumin: 3.9 g/dL (ref 3.5–5.0)
Alkaline Phosphatase: 73 U/L (ref 40–150)
Anion Gap: 9 mEq/L (ref 3–11)
BUN: 12.4 mg/dL (ref 7.0–26.0)
CO2: 28 mEq/L (ref 22–29)
Calcium: 9.5 mg/dL (ref 8.4–10.4)
Chloride: 103 mEq/L (ref 98–109)
Creatinine: 0.8 mg/dL (ref 0.6–1.1)
EGFR: 82 mL/min/{1.73_m2} — ABNORMAL LOW (ref >90)
Glucose: 79 mg/dl (ref 70–140)
Potassium: 3.8 mEq/L (ref 3.5–5.1)
Sodium: 140 mEq/L (ref 136–145)
Total Bilirubin: 0.42 mg/dL (ref 0.20–1.20)
Total Protein: 7.2 g/dL (ref 6.4–8.3)

## 2015-12-29 LAB — CBC WITH DIFFERENTIAL/PLATELET
BASO%: 0.9 % (ref 0.0–2.0)
Basophils Absolute: 0.1 10*3/uL (ref 0.0–0.1)
EOS%: 0.9 % (ref 0.0–7.0)
Eosinophils Absolute: 0.1 10*3/uL (ref 0.0–0.5)
HCT: 43.3 % (ref 34.8–46.6)
HGB: 14.3 g/dL (ref 11.6–15.9)
LYMPH%: 22.8 % (ref 14.0–49.7)
MCH: 30.3 pg (ref 25.1–34.0)
MCHC: 33.1 g/dL (ref 31.5–36.0)
MCV: 91.3 fL (ref 79.5–101.0)
MONO#: 0.4 10*3/uL (ref 0.1–0.9)
MONO%: 5.4 % (ref 0.0–14.0)
NEUT#: 5.1 10*3/uL (ref 1.5–6.5)
NEUT%: 70 % (ref 38.4–76.8)
Platelets: 187 10*3/uL (ref 145–400)
RBC: 4.74 10*6/uL (ref 3.70–5.45)
RDW: 13.9 % (ref 11.2–14.5)
WBC: 7.2 10*3/uL (ref 3.9–10.3)
lymph#: 1.6 10*3/uL (ref 0.9–3.3)

## 2015-12-29 MED ORDER — SODIUM CHLORIDE 0.9% FLUSH
10.0000 mL | INTRAVENOUS | Status: DC | PRN
  Administered 2015-12-29: 10 mL
  Filled 2015-12-29: qty 10

## 2015-12-29 MED ORDER — PALONOSETRON HCL INJECTION 0.25 MG/5ML
0.2500 mg | Freq: Once | INTRAVENOUS | Status: AC
  Administered 2015-12-29: 0.25 mg via INTRAVENOUS

## 2015-12-29 MED ORDER — ONDANSETRON HCL 8 MG PO TABS: 8.0000 mg | ORAL_TABLET | Freq: Two times a day (BID) | ORAL | Status: DC | PRN

## 2015-12-29 MED ORDER — PROCHLORPERAZINE MALEATE 10 MG PO TABS: 10.0000 mg | ORAL_TABLET | Freq: Four times a day (QID) | ORAL | Status: DC | PRN

## 2015-12-29 MED ORDER — PALONOSETRON HCL INJECTION 0.25 MG/5ML
INTRAVENOUS | Status: AC
  Filled 2015-12-29: qty 5

## 2015-12-29 MED ORDER — HEPARIN SOD (PORK) LOCK FLUSH 100 UNIT/ML IV SOLN
500.0000 [IU] | Freq: Once | INTRAVENOUS | Status: AC | PRN
  Administered 2015-12-29: 500 [IU]
  Filled 2015-12-29: qty 5

## 2015-12-29 MED ORDER — SODIUM CHLORIDE 0.9 % IV SOLN
Freq: Once | INTRAVENOUS | Status: AC
  Administered 2015-12-29: 12:00:00 via INTRAVENOUS

## 2015-12-29 MED ORDER — SODIUM CHLORIDE 0.9 % IV SOLN
600.0000 mg/m2 | Freq: Once | INTRAVENOUS | Status: AC
  Administered 2015-12-29: 960 mg via INTRAVENOUS
  Filled 2015-12-29: qty 48

## 2015-12-29 MED ORDER — DOXORUBICIN HCL CHEMO IV INJECTION 2 MG/ML
60.0000 mg/m2 | Freq: Once | INTRAVENOUS | Status: AC
  Administered 2015-12-29: 96 mg via INTRAVENOUS
  Filled 2015-12-29: qty 48

## 2015-12-29 MED ORDER — SODIUM CHLORIDE 0.9 % IV SOLN
Freq: Once | INTRAVENOUS | Status: AC
  Administered 2015-12-29: 12:00:00 via INTRAVENOUS
  Filled 2015-12-29: qty 5

## 2015-12-29 MED ORDER — DEXAMETHASONE 4 MG PO TABS: 4.0000 mg | ORAL_TABLET | Freq: Every day | ORAL | Status: DC

## 2015-12-29 MED ORDER — PEGFILGRASTIM 6 MG/0.6ML ~~LOC~~ PSKT
6.0000 mg | PREFILLED_SYRINGE | Freq: Once | SUBCUTANEOUS | Status: AC
  Administered 2015-12-29: 6 mg via SUBCUTANEOUS
  Filled 2015-12-29: qty 0.6

## 2015-12-29 MED ORDER — LORAZEPAM 1 MG PO TABS: 1.0000 mg | ORAL_TABLET | Freq: Three times a day (TID) | ORAL | Status: DC | PRN

## 2015-12-29 NOTE — Patient Instructions (Addendum)
Fosaprepitant injection Qu es este medicamento? El FOSAPREPITANT se [place] con otros medicamentos para prevenir las nuseas y el vmito asociados con el tratamiento del cncer (quimioterapia). Este medicamento puede ser utilizado para otros usos; si tiene alguna pregunta consulte con su proveedor de atencin mdica o con su farmacutico. Qu le debo informar a mi profesional de la salud antes de tomar este medicamento? Necesita saber si usted presenta alguno de los siguientes problemas o situaciones: -enfermedad heptica -una reaccin alrgica o inusual al fosaprepitant, al aprepitant, a otros medicamentos, alimentos, colorantes o conservantes -si est embarazada o buscando quedar embarazada -si est amamantando a un beb Cmo debo utilizar este medicamento? Este medicamento se administra mediante inyeccin por va intravenosa. Lo administra un profesional de Technical sales engineer en un hospital o en un entorno clnico. Hable con su pediatra para informarse acerca del uso de este medicamento en nios. Puede requerir atencin especial. Sobredosis: Pngase en contacto inmediatamente con un centro toxicolgico o una sala de urgencia si usted cree que haya tomado demasiado medicamento. ATENCIN: [employer] es solo para usted. No comparta este medicamento con nadie. Qu sucede si me olvido de una dosis? No se aplica en este caso. Qu puede interactuar con este medicamento? No tome esta medicina con ninguno de los siguientes medicamentos: -cisapride -pimozida -ranolazina Esta medicina tambin puede interactuar con los siguientes medicamentos: -diltiazem -hormonas femeninas, como estrgenos, progestinas o pldoras anticonceptivas -medicamentos para infecciones micticas, tales como quetoconazol, itraconazol -medicamentos para las infecciones por el VIH -medicamentos para las convulsiones o para Chief Technology Officer la epilepsia, tales como carbamazepina o fenitona -medicamentos utilizados para tratar  trastornos de ansiedad o del sueo, tales como alprazolam, diazepam o midazolam -nefazodona -paroxetina -rifampicina -algunos medicamentos quimioteraputicos, tales como etopsido, ifosfamida, vinblastina, vincristina -algunos antibiticos, tales como claritromicina, eritromicina, troleandomicina -medicamentos esteroideos, tales como dexametasona o metilprednisolona -tolbutamida -warfarina Puede ser que esta lista no menciona todas las posibles interacciones. Informe a su profesional de KB Home	[place] de [employer] productos a base de hierbas, medicamentos de [place] o suplementos nutritivos que est tomando. Si usted fuma, consume bebidas alcohlicas o si utiliza drogas ilegales, indqueselo tambin a su profesional de KB Home	[place]. Algunas sustancias pueden interactuar con su medicamento. A qu debo estar atento al usar Coca-Cola? No tome este medicamento si ya tiene nuseas y vmito. Consulte a su proveedor de atencin [employer] qu hacer si ya tiene nuseas. Las pldoras anticonceptivas y otros mtodos anticonceptivos hormonales (por ejemplo, el DIU o parche) pueden no Engineer, petroleum est utilizando Coca-Cola. Utilice un mtodo anticonceptivo adicional durante el tratamiento y por 1 mes despus de la ltima dosis de fosaprepitant. No se debe usar este medicamento continuamente durante [employer]. Visite a su mdico o a su profesional de la salud para chequear su evolucin peridicamente. Este medicamento puede [employer] de las pruebas de sangre de la funcin heptica. Qu efectos secundarios puedo tener al [employer] este medicamento? Efectos secundarios que debe informar a su mdico o a Barrister's clerk de la salud tan pronto como sea posible: -Chief of Staff como erupcin cutnea, picazn o urticarias, hinchazn de la cara, labios o lengua -problemas respiratorios -cambios en el ritmo cardiaco -alta o baja presin sangunea -dolor, enrojecimiento  o irritacin en el lugar de la inyeccin -sangrado rectal -mareos, desorientacin o confusin graves -dolor estomacal agudo o severo -dolor agudo en la pierna Efectos secundarios que, por lo general, no requieren atencin mdica (debe informarlos a su mdico o a su profesional de la salud si  persisten o si son molestos): -estreimiento o diarrea -prdida del cabello -dolor de cabeza -hipo -prdida del apetito -nuseas -Higher education careers adviser -cansancio Puede ser que esta lista no menciona todos los posibles efectos secundarios. Comunquese a su mdico por asesoramiento mdico [employer]. Usted puede informar los efectos secundarios a la FDA por telfono al 1-800-FDA-1088. Dnde debo guardar mi medicina? Este medicamento se administra en hospitales o clnicas y no necesitar guardarlo en su domicilio. ATENCIN: Este folleto es un resumen. Puede ser que no cubra toda la posible informacin. Si usted tiene preguntas acerca de esta medicina, consulte con su mdico, su farmacutico o su profesional de Technical sales engineer.    2016, Elsevier/Gold Standard. (2014-12-09 00:00:00) Palonosetron Injection Qu es este medicamento? El PALONOSETRN se [place] para prevenir las nuseas y el vmito causados por la quimioterapia. Tambin puede ayudar a prevenir las nuseas y el vmito retrasados que puede experimentar unos das despus del [place]. Este medicamento puede ser utilizado para otros usos; si tiene alguna pregunta consulte con su proveedor de atencin mdica o con su farmacutico. Qu le debo informar a mi profesional de la salud antes de tomar este medicamento? Necesita saber si usted presenta alguno de los siguientes problemas o situaciones: -una reaccin alrgica o inusual al palonosetrn, dolasetrn, granisetrn, ondansetrn, a otros medicamentos, alimentos, colorantes o conservantes -si est embarazada o buscando quedar embarazada -si est amamantando a un beb Cmo debo  [employer]? Este medicamento se administra mediante infusin por va intravenosa. Lo administra un profesional de Technical sales engineer en un hospital o en un entorno clnico. Hable con su pediatra para informarse acerca del uso de este medicamento en nios. Aunque este medicamento se puede recetar a nios tan menores como 1 mes de edad para condiciones selectivas, las precauciones se aplican. Sobredosis: Pngase en contacto inmediatamente con un centro toxicolgico o una sala de urgencia si usted cree que haya tomado demasiado medicamento. ATENCIN: [employer] es solo para usted. No comparta este medicamento con nadie. Qu sucede si me olvido de una dosis? No se aplica en este caso. Qu puede interactuar con este medicamento? -ciertos medicamentos para la depresin, ansiedad o trastornos psicticos -fentanilo -linezolid -IMAOs, tales como Carbex, Eldepryl, Marplan, Nardil y Parnate -azul de metileno (inyeccin por va intravenosa) -tramadol Puede ser que esta lista no menciona todas las posibles interacciones. Informe a su profesional de KB Home	[place] de [employer] productos a base de hierbas, medicamentos de [place] o suplementos nutritivos que est tomando. Si usted fuma, consume bebidas alcohlicas o si utiliza drogas ilegales, indqueselo tambin a su profesional de KB Home	[place]. Algunas sustancias pueden interactuar con su medicamento. A qu debo estar atento al usar Coca-Cola? Se supervisar su estado de salud atentamente mientras reciba este medicamento. Qu efectos secundarios puedo tener al [employer] este medicamento? Efectos secundarios que debe informar a su mdico o a Barrister's clerk de la salud tan pronto como sea posible: -Chief of Staff como erupcin cutnea, picazn o urticarias, hinchazn de la cara, labios o lengua -problemas respiratorios -confusin -mareos -pulso cardiaco rpido, irregular -fiebre y escalofros -prdida del equilibrio o  coordinacin -convulsiones -sudoracin -hinchazn de las manos y pies -temblores -cansancio o debilidad inusual Efectos secundarios que, por lo general, no requieren Geophysical data processor (debe informarlos a su mdico o a su profesional de la salud si persisten o si son molestos): -estreimiento o diarrea -dolor de cabeza Puede ser que esta lista no menciona todos los posibles efectos secundarios. Comunquese a su mdico por asesoramiento mdico sobre  los efectos secundarios. Usted puede informar los efectos secundarios a la FDA por telfono al 1-800-FDA-1088. Dnde debo guardar mi medicina? Este medicamento se administra en hospitales o clnicas y no necesitar guardarlo en su domicilio. ATENCIN: Este folleto es un resumen. Puede ser que no cubra toda la posible informacin. Si usted tiene preguntas acerca de esta medicina, consulte con su mdico, su farmacutico o su profesional de Technical sales engineer.    2016, Elsevier/Gold Standard. (2014-12-08 00:00:00) Pegfilgrastim injection Qu es este medicamento? El PEGFILGRASTIM es un factor estimulante de colonias de granulocitos de accin prolongada que estimula el crecimiento de los neutrfilos, un tipo de glbulo blanco importante en la lucha del cuerpo contra la infeccin. Se utiliza para reducir la incidencia de la fiebre y la infeccin en pacientes con ciertos tipos de cncer que reciben quimioterapia que afecta la mdula sea, y para aumentar la supervivencia despus de estar expuesto a altas dosis de radiacin. Este medicamento puede ser utilizado para otros usos; si tiene alguna pregunta consulte con su proveedor de atencin mdica o con su farmacutico. Qu le debo informar a mi profesional de la salud antes de tomar este medicamento? Necesita saber si usted presenta alguno de los siguientes problemas o situaciones: -enfermedad renal -alergia al ltex -si actualmente recibe radioterapia -anemia drepanoctica -reacciones cutneas a Therapist, music  acrlicos (On-Body Injector solamente, su nombre en ingls) -una reaccin alrgica o inusual al pegfilgrastim, al filgrastim, a otros medicamentos, alimentos, colorantes o conservantes -si est embarazada o buscando quedar embarazada -si est amamantando a un beb Cmo debo utilizar este medicamento? Este medicamento se administra mediante una inyeccin por va subcutnea. Si recibe Coca-Cola en su domicilio, le ensearn cmo preparar y Architectural technologist la jeringa prellenada o cmo usar Biomedical scientist en el cuerpo (su nombre en ingls, On-body Injector). Consulte las instrucciones de uso para el paciente para obtener instrucciones detalladas. selo exactamente como se le indique. Tome sus dosis a intervalos regulares. No tome su medicamento con una frecuencia mayor a la indicada. Es importante que deseche las agujas y las jeringas usadas en un recipiente resistente a los pinchazos. No las deseche en la basura. Si no tiene un recipiente resistente a los pinchazos, consulte a Midwife o su proveedor de atencin de la salud para obtenerlo. Hable con su pediatra para informarse acerca del uso de este medicamento en nios. Aunque este medicamento se puede recetar para condiciones selectivas, existen precauciones que deben cumplirse. Sobredosis: Pngase en contacto inmediatamente con un centro toxicolgico o una sala de urgencia si usted cree que haya tomado demasiado medicamento. ATENCIN: [employer] es solo para usted. No comparta este medicamento con nadie. Qu sucede si me olvido de una dosis? Es importante no olvidar ninguna dosis. Comunquese con su mdico o profesional de la salud si se olvida una dosis. Si se olvida una dosis debido a un fallo del Journalist, newspaper cuerpo (su nombre en ingls, On-body Injector). o fugas, una nueva dosis debe ser administrada tan pronto como sea posible usando una sola [place] prellenada para uso manual. Qu puede interactuar con este medicamento? No se  han estudiado las interacciones. Puede ser que esta lista no menciona todas las posibles interacciones. Informe a su profesional de KB Home	[place] de [employer] productos a base de hierbas, medicamentos de [place] o suplementos nutritivos que est tomando. Si usted fuma, consume bebidas alcohlicas o si utiliza drogas ilegales, indqueselo tambin a su profesional de KB Home	[place]. Algunas sustancias pueden interactuar con su medicamento. A qu debo estar  atento al usar Coca-Cola? Usted podr Psychologist, prison and probation services de sangre mientras est usando este medicamento. Si va a someterse a un IRM (MRI), tomografa computarizada u otro procedimiento, informe a su mdico que est usando este medicamento (su nombre en ingls, On-body Injector solamente). Qu efectos secundarios puedo tener al [employer] este medicamento? Efectos secundarios que debe informar a su mdico o a Barrister's clerk de la salud tan pronto como sea posible: -Chief of Staff como erupcin cutnea, picazn o urticarias, hinchazn de la cara, labios o lengua -mareos -fiebre -dolor, enrojecimiento o irritacin en la zona de la inyeccin -puntos rojos en la piel -orina de color roja o marrn oscura -falta de aliento o problemas respiratorios -dolor de Paramedic, dolor en un costado o dolor en el hombro -hinchazn -cansancio -dificultad para orinar o cambios en el volumen de orina Efectos secundarios que, por lo general, no requieren atencin mdica (debe informarlos a su mdico o a Barrister's clerk de la salud si persisten o si son molestos): -dolor de Scientist, research (physical sciences) -dolor de msculos Puede ser que esta lista no menciona todos los posibles efectos secundarios. Comunquese a su mdico por asesoramiento mdico [employer]. Usted puede informar los efectos secundarios a la FDA por telfono al 1-800-FDA-1088. Dnde debo guardar mi medicina? Mantngala fuera del alcance de los nios. Guarde las jeringas prellenadas en un  refrigerador, a una temperatura de [place] 2 y 79 grados C (60 y 40 grados F). No las congele. Mantngalas en el cartn para proteger contra la luz. Si este medicamento se queda fuera del refrigerador durante ms de 48 horas, debe desecharlo. Deseche todo el medicamento que no haya utilizado, despus de la fecha de vencimiento. ATENCIN: Este folleto es un resumen. Puede ser que no cubra toda la posible informacin. Si usted tiene preguntas acerca de esta medicina, consulte con su mdico, su farmacutico o su profesional de Technical sales engineer.    2016, Elsevier/Gold Standard. (2015-02-26 00:00:00) Doxorubicin injection Qu es este medicamento? La DOXORRUBICINA es un agente quimioteraputico. Se utiliza para el tratamiento de muchos tipos de cnceres, como enfermedad de Hodgkin, leucemia, linfoma no Hodgkin, neuroblastoma, sarcoma y el tumor de [place]. Se utiliza tambin para el tratamiento del cncer de vejiga, mama, pulmn, ovarios, estmago, y tiroides. Este medicamento puede ser utilizado para otros usos; si tiene alguna pregunta consulte con su proveedor de atencin mdica o con su farmacutico. Qu le debo informar a mi profesional de la salud antes de tomar este medicamento? Necesita saber si usted presenta alguno de los siguientes problemas o situaciones: -trastornos sanguneos -enfermedad cardiaca, ataque cardiaco reciente -infeccin (especialmente infecciones virales, como varicela o herpes) -latidos cardacos irregulares -enfermedad heptica -radioterapia reciente o continuada -una reaccin alrgica o inusual a la doxorrubicina, a otros agentes quimioteraputicos, a otros medicamentos, alimentos, colorantes o conservantes -si est embarazada o buscando quedar embarazada -si est amamantando a un beb Cmo debo utilizar este medicamento? Este medicamento se administra mediante infusin por va intravenosa. Lo administra un profesional de la salud calificado en un hospital o en un entorno clnico.  Si experimenta dolor, hinchazn, ardor o cualquier sensacin inusual alrededor del lugar de la inyeccin, informe inmediatamente a su profesional de KB Home	[place]. Hable con su pediatra para informarse acerca del uso de este medicamento en nios. Puede requerir atencin especial. Sobredosis: Pngase en contacto inmediatamente con un centro toxicolgico o una sala de urgencia si usted cree que haya tomado demasiado medicamento. ATENCIN: [employer] es solo para usted. No comparta este medicamento con nadie. Sander Nephew  sucede si me olvido de una dosis? Es importante no olvidar ninguna dosis. Informe a su mdico o a su profesional de la salud si no puede asistir a Photographer. Qu puede interactuar con este medicamento? No tome esta medicina con ninguno de los siguientes medicamentos: -cisapride -droperidol -halofantrina -pimozida -zidovudina Esta medicina tambin puede interactuar con los siguientes medicamentos: -cloroquina -clorpromacina -claritromicina -ciclofosfamida -ciclosporina -eritromicina -medicamentos para la depresin, ansiedad o trastornos psicticos -medicamentos utilizados para el pulso cardiaco irregular, tales como amiodarona, bepridil, dofetilida, encainida, flecainida, propafenona, quinidina -medicamentos para convulsiones, tales como etotona, fosfenitona, fenitona -medicamentos para las nuseas, vmito, tales como dolasetrn, [place], Contractor -medicamentos para incrementar los conteos sanguneos, tales como filgrastim, pegfilgrastim, sargramostim -metadona -metotrexato -pentamidina -progesterona -vacunas -verapamilo Consulte a su mdico o a su profesional de la salud antes de tomar cualquiera de los siguientes medicamentos: -acetaminofeno -aspirina -ibuprofeno -quetoprofeno -naproxeno Puede ser que esta lista no menciona todas las posibles interacciones. Informe a su profesional de KB Home	[place] de [employer] productos a base de hierbas, medicamentos de [place] o suplementos nutritivos que est tomando. Si usted fuma, consume bebidas alcohlicas o si utiliza drogas ilegales, indqueselo tambin a su profesional de KB Home	[place]. Algunas sustancias pueden interactuar con su medicamento. A qu debo estar atento al usar Coca-Cola? Se supervisar su condicin atentamente mientras reciba este medicamento. Tendr que hacerse anlisis de sangre peridicos mientras recibe [place]. Este medicamento puede hacerle sentir un Nurse, mental health. Esto es normal ya que la quimioterapia afecta tanto a las clulas sanas como a las clulas cancerosas. Si presenta alguno de los [employer], infrmelos. Sin embargo, contine con el tratamiento aun si se siente enfermo, a menos que su mdico le indique que lo suspenda. Despus de varios das de [place], [place] orina puede ser de color rojo. Esto no significa sangre en la orina. Si su orina es un color oscuro o Forensic psychologist, comunquese con su mdico. En algunos casos, podr recibir [employer] para ayudarle con los efectos secundarios. [place] para usar. Consulte a su mdico o a su profesional de la salud por asesoramiento si tiene fiebre, escalofros, dolor de garganta o cualquier otro sntoma de resfro o gripe. No se trate usted mismo. Este medicamento puede reducir la capacidad del cuerpo para combatir infecciones. Trate de no acercarse a personas que estn enfermas. [employer] puede aumentar el riesgo de magulladuras o sangrado. Consulte a su mdico o a su profesional de la salud si observa sangrados inusuales. Proceda con cuidado al cepillar sus dientes, usar hilo dental o Risk manager palillos para los dientes, ya que puede contraer una infeccin o Therapist, art con mayor facilidad. Si se somete a algn tratamiento dental, informe a su dentista que est [employer]. Evite tomar productos que contienen aspirina, acetaminofeno, ibuprofeno, naproxeno o quetoprofeno a menos que  as lo indique su mdico. Estos productos pueden disimular la fiebre. Los hombres y las mujeres en edad de procrear deben Risk manager mtodos anticonceptivos eficaces mientras reciben este medicamento. No se debe quedar embarazada mientras reciben Coca-Cola. Existe la posibilidad de efectos secundarios graves a un beb sin nacer. Para ms informacin hable con su profesional de la salud o su farmacutico. No debe amamantar a un beb mientras est tomando este medicamento. Evite que otras personas tocan su orina u otros fluidos corporales por lo menos 5 das despus de cada tratamiento. Quienes cuidan de los pacientes deben utilizar guantes de ltex para evitar el contacto con la [place] y otros fluidos corporales durante  este tiempo. Existe una cantidad mxima de este medicamento que debe recibir a lo largo de su vida. La cantidad depende de la afeccin mdica siendo tratado y su salud en general. Su mdico observar la cantidad de este medicamento que usted recibe en su vida. Informe a su mdico si usted ha tomado este medicamento antes. Qu efectos secundarios puedo tener al [employer] este medicamento? Efectos secundarios que debe informar a su mdico o a Barrister's clerk de la salud tan pronto como sea posible: -reacciones alrgicas como erupcin cutnea, picazn o urticarias, hinchazn de la cara, labios o lengua -conteos sanguneos bajos - este medicamento puede reducir la cantidad de glbulos blancos, glbulos rojos y plaquetas. Su riesgo de infeccin y [place]. -signos de infeccin - fiebre o escalofros, tos, dolor de garganta, Social research officer, government o dificultad para orinar -signos de reduccin de plaquetas o sangrado - magulladuras, puntos rojos en la piel, heces de color oscuro o con aspecto alquitranado, sangre en la orina -signos de reduccin de glbulos rojos - cansancio o debilidad inusual, desmayos, sensacin de mareo -problemas respiratorios -dolor en el pecho -pulso cardiaco rpido,  irregular -llagas en la boca -nuseas, vmito -dolor, enrojecimiento, hinchazn en el lugar de la inyeccin -dolor, hormigueo, entumecimiento de manos o pies -hinchazn de tobillos, pies o manos -sangrado, magulladuras inusuales Efectos secundarios que, por lo general, no requieren Geophysical data processor (debe informarlos a su mdico o a su profesional de la salud si persisten o si son molestos): -diarrea -enrojecimiento de la cara -cada del cabello -prdida del apetito -ausencia de perodos menstruales -dao o Glass blower/designer de las uas -ojos rojos o llorosos -color rojo de la orina -Higher education careers adviser Puede ser que esta lista no menciona todos los posibles efectos secundarios. Comunquese a su mdico por asesoramiento mdico [employer]. Usted puede informar los efectos secundarios a la FDA por telfono al 1-800-FDA-1088. Dnde debo guardar mi medicina? Este medicamento se administra en hospitales o clnicas y no necesitar guardarlo en su domicilio. ATENCIN: Este folleto es un resumen. Puede ser que no cubra toda la posible informacin. Si usted tiene preguntas acerca de esta medicina, consulte con su mdico, su farmacutico o su profesional de Technical sales engineer.    2016, Elsevier/Gold Standard. (2014-12-08 00:00:00) Cyclophosphamide injection Qu es este medicamento? La CICLOFOSFAMIDA es un agente quimioteraputico. Este medicamento reduce el crecimiento de las clulas cancerosas. Este medicamento se [place] en el tratamiento de varios tipos de cncer como linfoma, mieloma, leucemia, cncer de mama y cncer de ovarios, por ejemplo. Este medicamento puede ser utilizado para otros usos; si tiene alguna pregunta consulte con su proveedor de atencin mdica o con su farmacutico. Qu le debo informar a mi profesional de la salud antes de tomar este medicamento? Necesita saber si usted presenta alguno de los siguientes problemas o situaciones: -trastorno sanguneo -antecedentes de  quimioterapia -infeccin -enfermedad renal -enfermedad heptica -radioterapia reciente o en curso -tumors en la mdula sea -una reaccin alrgica o inusual a la ciclofosfamida, a otros agentes quimioteraputicos, a otros medicamentos, alimentos, colorantes o conservantes -si est embarazada o buscando quedar embarazada -si est amamantando a un beb Cmo debo utilizar este medicamento? Este medicamento normalmente se administra mediante inyeccin por va intravenosa o intramuscular o infusin por va intravenosa. Lo administra un profesional de la salud calificado en un hospital o en un entorno clnico. Hable con su pediatra para informarse acerca del uso de este medicamento en nios. Puede requerir atencin especial. Sobredosis: Pngase en contacto inmediatamente con un centro toxicolgico o  una sala de urgencia si usted cree que haya tomado demasiado medicamento. ATENCIN: [employer] es solo para usted. No comparta este medicamento con nadie. Qu sucede si me olvido de una dosis? Es importante no olvidar ninguna dosis. Informe a su mdico o a su profesional de la salud si no puede asistir a Photographer. Qu puede interactuar con este medicamento? Esta medicina puede interactuar con los siguientes medicamentos: -amiodarona -anfotericina B -azatioprina -ciertos medicamentos antivricos para el VIH o SIDA, tales como inhibidores de la proteasa (indinavir, ritonavir) y zidovudina -ciertos medicamentos para la presin sangunea, tales como benazepril, captopril, enalapril, fosinopril, lisinopril, moexipril, monopril, perindopril, quinapril, ramipril, trandolapril -ciertos medicamentos para el cncer tales como antraciclnicos (daunorrubicina, doxorrubicina), busulfn, citarabina, paclitaxel, pentostatina, tamoxifeno, trastuzumab -ciertos diurticos, tales como clorotiazida, clortalidona, hidroclorotiazida, indapamida, metolazona -ciertos medicamentos que tratan o previenen cogulos  sanguneos, como warfarina -ciertos relajantes musculares, tales como succinilcolina -ciclosporina -etanercept -indometacina -medicamentos para incrementar los conteos sanguneos, tales como filgrastim, pegfilgrastim, sargramostim -medicamentos utilizados para la anestesia general -metronidazol -natalizumab Puede ser que esta lista no menciona todas las posibles interacciones. Informe a su profesional de [employer]	[place] de [employer] productos a base de hierbas, medicamentos de [place] o suplementos nutritivos que est tomando. Si usted fuma, consume bebidas alcohlicas o si utiliza drogas ilegales, indqueselo tambin a su profesional de [employer]	[place]. Algunas sustancias pueden interactuar con su medicamento. A qu debo estar atento al usar [employer]? Visite a su mdico para chequear su evolucin. Este medicamento puede hacerle sentir un Nurse, mental health. Esto no es raro ya que la quimioterapia afecta tanto a las clulas sanas como a las clulas cancerosas. Si presenta alguno de los [employer], infrmelos. Sin embargo, contine con el tratamiento aun si se siente enfermo, a menos que su mdico le indique que lo suspenda. Beba agua u otros lquidos como se le haya indicado. Orine con frecuencia, especialmente de noche. En algunos casos, podr recibir [employer] para ayudarle con los efectos secundarios. Siga las instrucciones de [place]. Consulte a su mdico o a su profesional de la salud por asesoramiento si tiene fiebre, escalofros, dolor de garganta o cualquier otro sntoma de resfro o gripe. No se trate usted mismo. Este medicamento puede reducir la capacidad del cuerpo para combatir infecciones. Trate de no acercarse a personas que estn enfermas. [employer] puede aumentar el riesgo de magulladuras o sangrado. Consulte a su mdico o a su profesional de la salud si nota sangrados inusuales. Proceda con cuidado al cepillar sus dientes, usar hilo dental o Risk manager palillos  para los dientes, ya que puede contraer una infeccin o Therapist, art con mayor facilidad. Si se somete a algn tratamiento dental, informe a su dentista que est recibiendo [employer]. Puede experimentar mareos o somnolencia. No conduzca ni utilice maquinaria ni haga nada que Associate Professor en estado de alerta hasta que sepa cmo le afecta este medicamento. No se debe quedar embarazada mientras recibe este medicamento o durante 1 ao despus de terminarlo. Las mujeres deben informar a su mdico si estn buscando quedar embarazadas o si creen que estn embarazadas. Los hombres no deben tener hijos mientras estn recibiendo Coca[employer] y durante 4 meses despus de terminarlo. Existe la posibilidad de efectos secundarios graves a un beb sin nacer. Para ms informacin hable con su profesional de la salud o su farmacutico. No debe Economist a un beb mientras est usando este medicamento. Este medicamento puede interferir con la capacidad de tener hijos. En algunas mujeres, este medicamento ha  causado insuficiencia ovrica. En algunos hombres, este medicamento ha causado reduccin de los conteos de [place]. Usted debe consultarse con su mdico o su profesional de la salud si est preocupada por su fertilidad. Si va a someterse a una operacin, informe a su mdico o a su profesional de la salud que ha [employer]. Qu efectos secundarios puedo tener al [employer] este medicamento? Efectos secundarios que debe informar a su mdico o a Barrister's clerk de la salud tan pronto como sea posible: -reacciones alrgicas como erupcin cutnea, picazn o urticarias, hinchazn de la cara, labios o lengua -conteos sanguneos bajos - este medicamento puede reducir la cantidad de glbulos blancos, glbulos rojos y plaquetas. Su riesgo de infeccin y [place]. -signos de infeccin - fiebre o escalofros, tos, dolor de garganta, Social research officer, government o dificultad para Garment/textile technologist -signos de reduccin de  plaquetas o sangrado - magulladuras, puntos rojos en la piel, heces de color oscuro o con aspecto alquitranado, sangre en la orina -signos de reduccin de glbulos rojos - cansancio o debilidad inusual, desmayos, aturdimiento -problemas respiratorios -orina oscura -mareos -palpitaciones -hinchazn de tobillos, pies o manos -dificultad para orinar o cambios en el volumen de orina -aumento de peso -color amarillento de los ojos o la piel Efectos secundarios que, por lo general, no requieren Geophysical data processor (debe informarlos a su mdico o a su profesional de la salud si persisten o si son molestos): -cambios en el color de las uas o piel -cada del cabello -ausencia de perodos menstruales -llagas en la boca -nuseas, vmito Puede ser que [employer] no menciona todos los posibles efectos secundarios. Comunquese a su mdico por asesoramiento mdico [employer] Usted puede informar los efectos secundarios a la FDA por telfono al 1-800-FDA-1088. Dnde debo guardar mi medicina? Este medicamento se administra en hospitales o clnicas y no necesitar guardarlo en su domicilio. ATENCIN: Este folleto es un resumen. Puede ser que no cubra toda la posible informacin. Si usted tiene preguntas acerca de esta medicina, consulte con su mdico, su farmacutico o su profesional de Technical sales engineer.    2016, Elsevier/Gold Standard. (2014-12-08 00:00:00)

## 2015-12-29 NOTE — Telephone Encounter (Signed)
Patient already on schedule for lab/fu 3/7. No other orders per 3/1 pof. Gave patient new avs report and appointments.

## 2015-12-29 NOTE — Progress Notes (Signed)
Patient Care Team: Pcp Not In System as PCP - General  SUMMARY OF ONCOLOGIC HISTORY:   Breast cancer of upper-outer quadrant of left female breast ([place])   10/19/2015 Mammogram Screening mammogram revealed left breast asymmetry 1.1 x 0.8 x 1 cm, T1 cN0 stage IA clinical stage   11/03/2015 Initial Diagnosis Left breast biopsy 11:30 position: Invasive ductal carcinoma with DCIS, grade 2, ER 100%, PR 30%, Ki-67 90%, HER-2 negative ratio 1.44   11/22/2015 Surgery Lt Lumpectomy: IDC 1.5 cm, 0/2 LN, grade 2, with Int Grade DCIS, ER 100%, PR 30%, Her 2 Neg Ratio 1.91, T1cN0 (stage 1A)Oncotype DX score 37, 10 year ROR 25%   12/29/2015 -  Chemotherapy Adjuvant chemotherapy with dose dense Adriamycin Cytoxan 4 followed by Abraxane weekly 12   CHIEF COMPLIANT: Cycle 1 day 1 dose as it Adriamycin Cytoxan  INTERVAL HISTORY: Heather [name] is a 60 year old with above-mentioned history of left breast cancer who underwent lumpectomy and was found to have high risk Oncotype DX. She is here today for cycle 1 day 1 of adjuvant chemotherapy with Adriamycin and Cytoxan. She appears to be very nervous and anxious. She denies any complaints or concerns. She'll continue chemotherapy education. Her echocardiogram showed an ejection fraction of 55-60%.  REVIEW OF SYSTEMS:   Constitutional: Denies fevers, chills or abnormal weight loss Eyes: Denies blurriness of vision Ears, nose, mouth, throat, and face: Denies mucositis or sore throat Respiratory: Denies cough, dyspnea or wheezes Cardiovascular: Denies palpitation, chest discomfort Gastrointestinal:  Denies nausea, heartburn or change in bowel habits Skin: Denies abnormal skin rashes Lymphatics: Denies new lymphadenopathy or easy bruising Neurological:Denies numbness, tingling or new weaknesses Behavioral/Psych: Mood is stable, no new changes  Extremities: No lower extremity edema Breast:  denies any pain or lumps or nodules in either breasts All other systems  were reviewed with the patient and are negative.  I have reviewed the past medical history, past surgical history, social history and family history with the patient and they are unchanged from previous note.  ALLERGIES:  has No Known Allergies.  MEDICATIONS:  Current Outpatient Prescriptions  Medication Sig Dispense Refill  . Ascorbic Acid (VITAMIN C) 1000 MG tablet Take 1,000 mg by mouth daily.    . calcium carbonate (OS-CAL) 600 MG TABS Take 600 mg by mouth 2 (two) times daily with a meal.    . cholecalciferol (VITAMIN D) 1000 UNITS tablet Take 1,000 Units by mouth daily.    Marland Kitchen dexamethasone (DECADRON) 4 MG tablet Take 1 tablet (4 mg total) by mouth daily. Take 1 tab daily x 2 days 15 tablet 0  . LORazepam (ATIVAN) 1 MG tablet Take 1 tablet (1 mg total) by mouth every 8 (eight) hours as needed for anxiety (or nausea). 30 tablet 0  . ondansetron (ZOFRAN) 8 MG tablet Take 1 tablet (8 mg total) by mouth 2 (two) times daily as needed. Start on the third day after chemotherapy. 30 tablet 1  . oxyCODONE-acetaminophen (ROXICET) 5-325 MG tablet Take 1-2 tablets by mouth every 4 (four) hours as needed. 50 tablet 0  . prochlorperazine (COMPAZINE) 10 MG tablet Take 1 tablet (10 mg total) by mouth every 6 (six) hours as needed (Nausea or vomiting). 30 tablet 1  . VITAMIN E COMPLEX PO Take by mouth.     No current facility-administered medications for this visit.   Facility-Administered Medications Ordered in Other Visits  Medication Dose Route Frequency Provider Last Rate Last Dose  . 0.9 %  sodium chloride infusion  Intravenous Once Nicholas Lose, MD      . cyclophosphamide (CYTOXAN) 960 mg in sodium chloride 0.9 % 250 mL chemo infusion  600 mg/m2 (Treatment Plan Actual) Intravenous Once Nicholas Lose, MD      . DOXOrubicin (ADRIAMYCIN) chemo injection 96 mg  60 mg/m2 (Treatment Plan Actual) Intravenous Once Nicholas Lose, MD      . fosaprepitant (EMEND) 150 mg, dexamethasone (DECADRON) 12 mg in  sodium chloride 0.9 % 145 mL IVPB   Intravenous Once Nicholas Lose, MD      . heparin lock flush 100 unit/mL  500 Units Intracatheter Once PRN Nicholas Lose, MD      . palonosetron (ALOXI) injection 0.25 mg  0.25 mg Intravenous Once Nicholas Lose, MD      . pegfilgrastim (NEULASTA ONPRO KIT) injection 6 mg  6 mg Subcutaneous Once Nicholas Lose, MD      . sodium chloride flush (NS) 0.9 % injection 10 mL  10 mL Intracatheter PRN Nicholas Lose, MD        PHYSICAL EXAMINATION: ECOG PERFORMANCE STATUS: 0 - Asymptomatic  Filed Vitals:   12/29/15 0958  BP: 131/76  Pulse: 79  Temp: 97.9 F (36.6 C)  Resp: 18   Filed Weights   12/29/15 0958  Weight: 137 lb 14.4 oz (62.551 kg)    GENERAL:alert, no distress and comfortable SKIN: skin color, texture, turgor are normal, no rashes or significant lesions EYES: normal, Conjunctiva are pink and non-injected, sclera clear OROPHARYNX:no exudate, no erythema and lips, buccal mucosa, and tongue normal  NECK: supple, thyroid normal size, non-tender, without nodularity LYMPH:  no palpable lymphadenopathy in the cervical, axillary or inguinal LUNGS: clear to auscultation and percussion with normal breathing effort HEART: regular rate & rhythm and no murmurs and no lower extremity edema ABDOMEN:abdomen soft, non-tender and normal bowel sounds MUSCULOSKELETAL:no cyanosis of digits and no clubbing  NEURO: alert & oriented x 3 with fluent speech, no focal motor/sensory deficits EXTREMITIES: No lower extremity edema   LABORATORY DATA:  I have reviewed the data as listed   Chemistry      Component Value Date/Time   NA 140 12/29/2015 0953   K 3.8 12/29/2015 0953   CO2 28 12/29/2015 0953   BUN 12.4 12/29/2015 0953   CREATININE 0.8 12/29/2015 0953      Component Value Date/Time   CALCIUM 9.5 12/29/2015 0953   ALKPHOS 73 12/29/2015 0953   AST 22 12/29/2015 0953   ALT 21 12/29/2015 0953   BILITOT 0.42 12/29/2015 0953       Lab Results  Component  Value Date   WBC 7.2 12/29/2015   HGB 14.3 12/29/2015   HCT 43.3 12/29/2015   MCV 91.3 12/29/2015   PLT 187 12/29/2015   NEUTROABS 5.1 12/29/2015   ASSESSMENT & PLAN:  Breast cancer of upper-outer quadrant of left female breast [facility])  Discussed with the patient using a spanish language interpreter Lt Lumpectomy 11/22/15: IDC 1.5 cm, 0/2 LN, grade 2, with Int Grade DCIS, ER 100%, PR 30%, Her 2 Neg Ratio 1.91, T1cN0 (stage 1A), Oncotype DX recurrence score 37, 25% 10 year risk of recurrence  Recommendation:  1. Systemic chemotherapy with dose dense Adriamycin and Cytoxan 4 followed by Abraxane weekly 12 2. Adjuvant radiation therapy followed by 3. Adjuvant antiestrogen therapy -------------------------------------------------------------------------------------------------------------------------- Current Treatment: Cycle 1 day 1 of Dose Dense Adriamycin and Cytoxan ECHO: 12/23/15: EF 55-60% Anti-emetics were reviewed.  RTC in 1 week for toxicity check   No orders of the defined  types were placed in this encounter.   The patient has a good understanding of the overall plan. she agrees with it. she will call with any problems that may develop before the next visit here.   Rulon Eisenmenger, MD 12/29/2015

## 2015-12-30 ENCOUNTER — Encounter: Payer: Self-pay | Admitting: *Deleted

## 2015-12-30 NOTE — OR Nursing (Signed)
Fentanyl 100 mcg vial pulled for pt: pt did not require further dose.  Failed to return vial and was forgotten until found in uniform pocket before next shift on 12/29/15. Fentanyl wasted with Rex Kras RN witnessing and reported to narcotics tech.

## 2015-12-30 NOTE — Progress Notes (Signed)
Chemo f/u.  Called and left message on v/m to call us back to let us know how she was doing after first [facility].

## 2016-01-03 NOTE — Assessment & Plan Note (Signed)
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 Systemic chemotherapy with dose dense Adriamycin and Cytoxan 4 followed by Abraxane weekly 12 2. Adjuvant radiation therapy followed by 3. Adjuvant antiestrogen therapy -------------------------------------------------------------------------------------------------------------------------- Current Treatment: Cycle 1 day 8 of Dose Dense Adriamycin and Cytoxan ECHO: 12/23/15: EF 55-60% Anti-emetics were reviewed.  RTC in 1 week for cycle 2

## 2016-01-04 ENCOUNTER — Encounter: Payer: Self-pay | Admitting: Hematology and Oncology

## 2016-01-04 ENCOUNTER — Other Ambulatory Visit (HOSPITAL_BASED_OUTPATIENT_CLINIC_OR_DEPARTMENT_OTHER): Payer: BLUE CROSS/BLUE SHIELD

## 2016-01-04 ENCOUNTER — Ambulatory Visit (HOSPITAL_BASED_OUTPATIENT_CLINIC_OR_DEPARTMENT_OTHER): Payer: BLUE CROSS/BLUE SHIELD | Admitting: Hematology and Oncology

## 2016-01-04 ENCOUNTER — Telehealth: Payer: Self-pay | Admitting: Hematology and Oncology

## 2016-01-04 ENCOUNTER — Other Ambulatory Visit: Payer: Self-pay | Admitting: *Deleted

## 2016-01-04 ENCOUNTER — Encounter: Payer: Self-pay | Admitting: *Deleted

## 2016-01-04 VITALS — BP 92/65 | HR 96 | Temp 97.6°F | Resp 18 | Ht 59.0 in | Wt 139.1 lb

## 2016-01-04 DIAGNOSIS — C50412 Malignant neoplasm of upper-outer quadrant of left female breast: Secondary | ICD-10-CM

## 2016-01-04 DIAGNOSIS — D701 Agranulocytosis secondary to cancer chemotherapy: Secondary | ICD-10-CM

## 2016-01-04 DIAGNOSIS — T451X5A Adverse effect of antineoplastic and immunosuppressive drugs, initial encounter: Secondary | ICD-10-CM

## 2016-01-04 LAB — CBC WITH DIFFERENTIAL/PLATELET
BASO%: 1.7 % (ref 0.0–2.0)
Basophils Absolute: 0 10*3/uL (ref 0.0–0.1)
EOS%: 2.9 % (ref 0.0–7.0)
Eosinophils Absolute: 0.1 10*3/uL (ref 0.0–0.5)
HCT: 40.4 % (ref 34.8–46.6)
HGB: 13.8 g/dL (ref 11.6–15.9)
LYMPH%: 47.9 % (ref 14.0–49.7)
MCH: 30.6 pg (ref 25.1–34.0)
MCHC: 34.2 g/dL (ref 31.5–36.0)
MCV: 89.6 fL (ref 79.5–101.0)
MONO#: 0 10*3/uL — ABNORMAL LOW (ref 0.1–0.9)
MONO%: 1.7 % (ref 0.0–14.0)
NEUT#: 1.1 10*3/uL — ABNORMAL LOW (ref 1.5–6.5)
NEUT%: 45.8 % (ref 38.4–76.8)
Platelets: 133 10*3/uL — ABNORMAL LOW (ref 145–400)
RBC: 4.51 10*6/uL (ref 3.70–5.45)
RDW: 13.2 % (ref 11.2–14.5)
WBC: 2.4 10*3/uL — ABNORMAL LOW (ref 3.9–10.3)
lymph#: 1.1 10*3/uL (ref 0.9–3.3)
nRBC: 0 % (ref 0–0)

## 2016-01-04 LAB — COMPREHENSIVE METABOLIC PANEL
ALT: 36 U/L (ref 0–55)
AST: 24 U/L (ref 5–34)
Albumin: 3.6 g/dL (ref 3.5–5.0)
Alkaline Phosphatase: 102 U/L (ref 40–150)
Anion Gap: 9 mEq/L (ref 3–11)
BUN: 12.9 mg/dL (ref 7.0–26.0)
CO2: 30 mEq/L — ABNORMAL HIGH (ref 22–29)
Calcium: 9.4 mg/dL (ref 8.4–10.4)
Chloride: 100 mEq/L (ref 98–109)
Creatinine: 0.7 mg/dL (ref 0.6–1.1)
EGFR: 89 mL/min/{1.73_m2} — ABNORMAL LOW (ref >90)
Glucose: 83 mg/dl (ref 70–140)
Potassium: 4.5 mEq/L (ref 3.5–5.1)
Sodium: 139 mEq/L (ref 136–145)
Total Bilirubin: 0.76 mg/dL (ref 0.20–1.20)
Total Protein: 7.1 g/dL (ref 6.4–8.3)

## 2016-01-04 NOTE — Progress Notes (Signed)
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%, Ki-67 90%, HER-2 negative ratio 1.44   11/22/2015 Surgery Lt Lumpectomy: IDC 1.5 cm, 0/2 LN, grade 2, with Int Grade DCIS, ER 100%, PR 30%, Her 2 Neg Ratio 1.91, T1cN0 (stage 1A)Oncotype DX score 37, 10 year ROR 25%   12/29/2015 -  Chemotherapy Adjuvant chemotherapy with dose dense Adriamycin Cytoxan 4 followed by Abraxane weekly 12    CHIEF COMPLIANT: Cycle 1 day 8 dose dense Adriamycin and Cytoxan  INTERVAL HISTORY: Heather [name] is a 60 year old with above-mentioned history of breast cancer who underwent lumpectomy and has started adjuvant chemotherapy with dose dense Adriamycin and Cytoxan. Today is cycle 1 day 8. She tolerated chemotherapy moderately well. She did have bone discomfort that lasted couple of days. She also complained of mild nausea that lasted 3-4 days. She is now back to working full-time. She denies any current issues with nausea. She has good appetite and is eating well. She is very anxious and worried about losing her hair.  REVIEW OF SYSTEMS:   Constitutional: Denies fevers, chills or abnormal weight loss, mild fatigue Eyes: Denies blurriness of vision Ears, nose, mouth, throat, and face: Denies mucositis or sore throat Respiratory: Denies cough, dyspnea or wheezes Cardiovascular: Denies palpitation, chest discomfort Gastrointestinal:  Mild nausea Skin: Denies abnormal skin rashes Lymphatics: Denies new lymphadenopathy or easy bruising Neurological:Denies numbness, tingling or new weaknesses Behavioral/Psych: Mood is stable, no new changes  Extremities: No lower  extremity edema Breast:  denies any pain or lumps or nodules in either breasts All other systems were reviewed with the patient and are negative.  I have reviewed the past medical history, past surgical history, social history and family history with the patient and they are unchanged from previous note.  ALLERGIES:  has No Known Allergies.  MEDICATIONS:  Current Outpatient Prescriptions  Medication Sig Dispense Refill  . Ascorbic Acid (VITAMIN C) 1000 MG tablet Take 1,000 mg by mouth daily.    . calcium carbonate (OS-CAL) 600 MG TABS Take 600 mg by mouth 2 (two) times daily with a meal.    . cholecalciferol (VITAMIN D) 1000 UNITS tablet Take 1,000 Units by mouth daily.    Marland Kitchen dexamethasone (DECADRON) 4 MG tablet Take 1 tablet (4 mg total) by mouth daily. Take 1 tab daily x 2 days 15 tablet 0  . LORazepam (ATIVAN) 1 MG tablet Take 1 tablet (1 mg total) by mouth every 8 (eight) hours as needed for anxiety (or nausea). 30 tablet 0  . ondansetron (ZOFRAN) 8 MG tablet Take 1 tablet (8 mg total) by mouth 2 (two) times daily as needed. Start on the third day after chemotherapy. 30 tablet 1  . oxyCODONE-acetaminophen (ROXICET) 5-325 MG tablet Take 1-2 tablets by mouth every 4 (four) hours as needed. 50 tablet 0  . prochlorperazine (COMPAZINE) 10 MG tablet Take 1 tablet (10 mg total) by mouth every 6 (six) hours as needed (Nausea or vomiting). 30 tablet 1  . VITAMIN E COMPLEX PO Take by mouth.     No current facility-administered medications for this visit.    PHYSICAL EXAMINATION: ECOG PERFORMANCE  STATUS: 1 - Symptomatic but completely ambulatory  Filed Vitals:   01/04/16 1123  BP: 92/65  Pulse: 96  Temp: 97.6 F (36.4 C)  Resp: 18   Filed Weights   01/04/16 1123  Weight: 139 lb 1.6 oz (63.095 kg)    GENERAL:alert, no distress and comfortable SKIN: skin color, texture, turgor are normal, no rashes or significant lesions EYES: normal, Conjunctiva are pink and non-injected, sclera  clear OROPHARYNX:no exudate, no erythema and lips, buccal mucosa, and tongue normal  NECK: supple, thyroid normal size, non-tender, without nodularity LYMPH:  no palpable lymphadenopathy in the cervical, axillary or inguinal LUNGS: clear to auscultation and percussion with normal breathing effort HEART: regular rate & rhythm and no murmurs and no lower extremity edema ABDOMEN:abdomen soft, non-tender and normal bowel sounds MUSCULOSKELETAL:no cyanosis of digits and no clubbing  NEURO: alert & oriented x 3 with fluent speech, no focal motor/sensory deficits EXTREMITIES: No lower extremity edema  LABORATORY DATA:  I have reviewed the data as listed   Chemistry      Component Value Date/Time   NA 140 12/29/2015 0953   K 3.8 12/29/2015 0953   CO2 28 12/29/2015 0953   BUN 12.4 12/29/2015 0953   CREATININE 0.8 12/29/2015 0953      Component Value Date/Time   CALCIUM 9.5 12/29/2015 0953   ALKPHOS 73 12/29/2015 0953   AST 22 12/29/2015 0953   ALT 21 12/29/2015 0953   BILITOT 0.42 12/29/2015 0953       Lab Results  Component Value Date   WBC 2.4* 01/04/2016   HGB 13.8 01/04/2016   HCT 40.4 01/04/2016   MCV 89.6 01/04/2016   PLT 133* 01/04/2016   NEUTROABS 1.1* 01/04/2016   ASSESSMENT & PLAN:  Breast cancer of upper-outer quadrant of left female breast ([place]) Lt Lumpectomy 11/22/15: IDC 1.5 cm, 0/2 LN, grade 2, with Int Grade DCIS, ER 100%, PR 30%, Her 2 Neg Ratio 1.91, T1cN0 (stage 1A), Oncotype DX recurrence score 37, 25% 10 year risk of recurrence  Recommendation:  1. Systemic chemotherapy with dose dense Adriamycin and Cytoxan 4 followed by Abraxane weekly 12 2. Adjuvant radiation therapy followed by 3. Adjuvant antiestrogen therapy -------------------------------------------------------------------------------------------------------------------------- Current Treatment: Cycle 1 day 8 of Dose Dense Adriamycin and Cytoxan ECHO: 12/23/15: EF 55-60% Chemotherapy  toxicities: 1. Bone discomfort from Neulasta 2. Nausea grade 1 3. Fatigue grade 1 4. Neutropenia ANC 1100. I discussed with her that it is expected for nadir count. No need to change any treatment dose. Monitoring closely for toxicities  RTC in 1 week for cycle 2   No orders of the defined types were placed in this encounter.   The patient has a good understanding of the overall plan. she agrees with it. she will call with any problems that may develop before the next visit here.   Rulon Eisenmenger, MD 01/04/2016     April 11 [facility] radiation in 1 week to see her she can be seen tomorrow or today and see if she could be seems

## 2016-01-04 NOTE — Telephone Encounter (Signed)
appt made and avs printed °

## 2016-01-04 NOTE — Progress Notes (Signed)
Met w/ pt regarding copay assistance.  Informed pt of the [place] First Step program and Patient [place] that will assist with her out of pocket after her ins pays.  She wants me to talk with her husband about it so she has my card so he call me at his convenience.  Informed pt of the [place] and went over what it will and will not cover and gave her an expense sheet.  I will discuss that with her husband as well.

## 2016-01-06 ENCOUNTER — Encounter: Payer: Self-pay | Admitting: Hematology and Oncology

## 2016-01-06 NOTE — Progress Notes (Signed)
Spoke w/ pt's husband regarding copay assistance. Informed him of the [place] First Step program & Patient Heather [name] that will assist w/ her out of pocket after her ins pays.  Informed him of the [employer] which we will go over what the grant will & will not cover on 01/12/16 when he brings his proof of income.

## 2016-01-11 ENCOUNTER — Other Ambulatory Visit: Payer: Self-pay | Admitting: *Deleted

## 2016-01-11 DIAGNOSIS — C50412 Malignant neoplasm of upper-outer quadrant of left female breast: Secondary | ICD-10-CM

## 2016-01-11 NOTE — Assessment & Plan Note (Signed)
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 Systemic chemotherapy with dose dense Adriamycin and Cytoxan 4 followed by Abraxane weekly 12 2. Adjuvant radiation therapy followed by 3. Adjuvant antiestrogen therapy -------------------------------------------------------------------------------------------------------------------------- Current Treatment: Cycle 2 day 1 of Dose Dense Adriamycin and Cytoxan ECHO: 12/23/15: EF 55-60% Chemotherapy toxicities: 1. Bone discomfort from Neulasta 2. Nausea grade 1 3. Fatigue grade 1 4. Neutropenia ANC 1100. I discussed with her that it is expected for nadir count. No need to change any treatment dose. Monitoring closely for toxicities  RTC in 2 weeks for cycle 3

## 2016-01-12 ENCOUNTER — Telehealth: Payer: Self-pay | Admitting: Hematology and Oncology

## 2016-01-12 ENCOUNTER — Ambulatory Visit (HOSPITAL_BASED_OUTPATIENT_CLINIC_OR_DEPARTMENT_OTHER): Payer: BLUE CROSS/BLUE SHIELD

## 2016-01-12 ENCOUNTER — Other Ambulatory Visit (HOSPITAL_BASED_OUTPATIENT_CLINIC_OR_DEPARTMENT_OTHER): Payer: BLUE CROSS/BLUE SHIELD

## 2016-01-12 ENCOUNTER — Encounter: Payer: Self-pay | Admitting: *Deleted

## 2016-01-12 ENCOUNTER — Encounter: Payer: Self-pay | Admitting: Hematology and Oncology

## 2016-01-12 ENCOUNTER — Ambulatory Visit (HOSPITAL_BASED_OUTPATIENT_CLINIC_OR_DEPARTMENT_OTHER): Payer: BLUE CROSS/BLUE SHIELD | Admitting: Hematology and Oncology

## 2016-01-12 VITALS — BP 118/70 | HR 90 | Temp 97.5°F | Resp 18 | Ht 59.0 in | Wt 137.7 lb

## 2016-01-12 DIAGNOSIS — C50412 Malignant neoplasm of upper-outer quadrant of left female breast: Secondary | ICD-10-CM

## 2016-01-12 DIAGNOSIS — Z5111 Encounter for antineoplastic chemotherapy: Secondary | ICD-10-CM

## 2016-01-12 LAB — COMPREHENSIVE METABOLIC PANEL
ALT: 37 U/L (ref 0–55)
AST: 25 U/L (ref 5–34)
Albumin: 3.8 g/dL (ref 3.5–5.0)
Alkaline Phosphatase: 117 U/L (ref 40–150)
Anion Gap: 8 mEq/L (ref 3–11)
BUN: 8.7 mg/dL (ref 7.0–26.0)
CO2: 31 mEq/L — ABNORMAL HIGH (ref 22–29)
Calcium: 9.9 mg/dL (ref 8.4–10.4)
Chloride: 103 mEq/L (ref 98–109)
Creatinine: 0.8 mg/dL (ref 0.6–1.1)
EGFR: 82 mL/min/{1.73_m2} — ABNORMAL LOW (ref >90)
Glucose: 73 mg/dl (ref 70–140)
Potassium: 4.7 mEq/L (ref 3.5–5.1)
Sodium: 142 mEq/L (ref 136–145)
Total Bilirubin: <0.3 mg/dL (ref 0.20–1.20)
Total Protein: 7.6 g/dL (ref 6.4–8.3)

## 2016-01-12 LAB — CBC WITH DIFFERENTIAL/PLATELET
BASO%: 1 % (ref 0.0–2.0)
Basophils Absolute: 0.1 10*3/uL (ref 0.0–0.1)
EOS%: 0.2 % (ref 0.0–7.0)
Eosinophils Absolute: 0 10*3/uL (ref 0.0–0.5)
HCT: 44 % (ref 34.8–46.6)
HGB: 14.3 g/dL (ref 11.6–15.9)
LYMPH%: 20.4 % (ref 14.0–49.7)
MCH: 29.5 pg (ref 25.1–34.0)
MCHC: 32.6 g/dL (ref 31.5–36.0)
MCV: 90.7 fL (ref 79.5–101.0)
MONO#: 0.7 10*3/uL (ref 0.1–0.9)
MONO%: 6.4 % (ref 0.0–14.0)
NEUT#: 8 10*3/uL — ABNORMAL HIGH (ref 1.5–6.5)
NEUT%: 72 % (ref 38.4–76.8)
Platelets: 217 10*3/uL (ref 145–400)
RBC: 4.85 10*6/uL (ref 3.70–5.45)
RDW: 13.8 % (ref 11.2–14.5)
WBC: 11.1 10*3/uL — ABNORMAL HIGH (ref 3.9–10.3)
lymph#: 2.3 10*3/uL (ref 0.9–3.3)

## 2016-01-12 MED ORDER — SODIUM CHLORIDE 0.9 % IV SOLN
Freq: Once | INTRAVENOUS | Status: AC
  Administered 2016-01-12: 10:00:00 via INTRAVENOUS
  Filled 2016-01-12: qty 5

## 2016-01-12 MED ORDER — SODIUM CHLORIDE 0.9% FLUSH
10.0000 mL | INTRAVENOUS | Status: DC | PRN
  Administered 2016-01-12: 10 mL
  Filled 2016-01-12: qty 10

## 2016-01-12 MED ORDER — PALONOSETRON HCL INJECTION 0.25 MG/5ML
0.2500 mg | Freq: Once | INTRAVENOUS | Status: AC
  Administered 2016-01-12: 0.25 mg via INTRAVENOUS

## 2016-01-12 MED ORDER — PALONOSETRON HCL INJECTION 0.25 MG/5ML
INTRAVENOUS | Status: AC
  Filled 2016-01-12: qty 5

## 2016-01-12 MED ORDER — HEPARIN SOD (PORK) LOCK FLUSH 100 UNIT/ML IV SOLN
500.0000 [IU] | Freq: Once | INTRAVENOUS | Status: AC | PRN
  Administered 2016-01-12: 500 [IU]
  Filled 2016-01-12: qty 5

## 2016-01-12 MED ORDER — PEGFILGRASTIM 6 MG/0.6ML ~~LOC~~ PSKT
6.0000 mg | PREFILLED_SYRINGE | Freq: Once | SUBCUTANEOUS | Status: AC
  Administered 2016-01-12: 6 mg via SUBCUTANEOUS
  Filled 2016-01-12: qty 0.6

## 2016-01-12 MED ORDER — DOXORUBICIN HCL CHEMO IV INJECTION 2 MG/ML
60.0000 mg/m2 | Freq: Once | INTRAVENOUS | Status: AC
  Administered 2016-01-12: 96 mg via INTRAVENOUS
  Filled 2016-01-12: qty 48

## 2016-01-12 MED ORDER — SODIUM CHLORIDE 0.9 % IV SOLN
Freq: Once | INTRAVENOUS | Status: AC
  Administered 2016-01-12: 10:00:00 via INTRAVENOUS

## 2016-01-12 MED ORDER — SODIUM CHLORIDE 0.9 % IV SOLN
600.0000 mg/m2 | Freq: Once | INTRAVENOUS | Status: AC
  Administered 2016-01-12: 960 mg via INTRAVENOUS
  Filled 2016-01-12: qty 48

## 2016-01-12 NOTE — Telephone Encounter (Signed)
appt made and avs to be printed in infusion

## 2016-01-12 NOTE — Progress Notes (Signed)
Met w/ pt & her husband to discuss copay assistance. Informed pt again of the [place] First Step program & went over how the program works. Enrolled her on 01/12/16. Faxed signed app & activated card today. Talked about the Patient [employer] which pt is approved for $5000 to cover drugs associated w/ her Dx for 12 months from 01/12/16 w/ a 6 month look back period. $0891 is guaranteed, $3350 is accessible on a first-come first-served basis as long as funding is available. I emailed copy of approval letter & POE to [place] in billing. Informed pt of the [place] went over what it will & will not cover which pt is approved for the grant. Gave her another expense sheet.

## 2016-01-12 NOTE — Patient Instructions (Signed)
[place] Discharge Instructions for Patients Receiving Chemotherapy  Today you received the following chemotherapy agents Adriamycin/ Cytoxan  To help prevent nausea and vomiting after your treatment, we encourage you to take your nausea medication as directedIf you develop nausea and vomiting that is not controlled by your nausea medication, call the clinic.   BELOW ARE SYMPTOMS THAT SHOULD BE REPORTED IMMEDIATELY:  *FEVER GREATER THAN 100.5 F  *CHILLS WITH OR WITHOUT FEVER  NAUSEA AND VOMITING THAT IS NOT CONTROLLED WITH YOUR NAUSEA MEDICATION  *UNUSUAL SHORTNESS OF BREATH  *UNUSUAL BRUISING OR BLEEDING  TENDERNESS IN MOUTH AND THROAT WITH OR WITHOUT PRESENCE OF ULCERS  *URINARY PROBLEMS  *BOWEL PROBLEMS  UNUSUAL RASH Items with * indicate a potential emergency and should be followed up as soon as possible.  Feel free to call the clinic you have any questions or concerns. The clinic phone number is [phone].  Please show the [place] at check-in to the Emergency Department and triage nurse.    Cyclophosphamide injection Qu es este medicamento? La CICLOFOSFAMIDA es un agente quimioteraputico. Este medicamento reduce el crecimiento de las clulas cancerosas. Este medicamento se [place] en el tratamiento de varios tipos de cncer como linfoma, mieloma, leucemia, cncer de mama y cncer de ovarios, por ejemplo. Este medicamento puede ser utilizado para otros usos; si tiene alguna pregunta consulte con su proveedor de atencin mdica o con su farmacutico. Qu le debo informar a mi profesional de la salud antes de tomar este medicamento? Necesita saber si usted presenta alguno de los siguientes problemas o situaciones: -trastorno sanguneo -antecedentes de quimioterapia -infeccin -enfermedad renal -enfermedad heptica -radioterapia reciente o en curso -tumors en la mdula sea -una reaccin alrgica o inusual a la ciclofosfamida, a otros  agentes quimioteraputicos, a otros medicamentos, alimentos, colorantes o conservantes -si est embarazada o buscando quedar embarazada -si est amamantando a un beb Cmo debo utilizar este medicamento? Este medicamento normalmente se administra mediante inyeccin por va intravenosa o intramuscular o infusin por va intravenosa. Lo administra un profesional de la salud calificado en un hospital o en un entorno clnico. Hable con su pediatra para informarse acerca del uso de este medicamento en nios. Puede requerir atencin especial. Sobredosis: Pngase en contacto inmediatamente con un centro toxicolgico o una sala de urgencia si usted cree que haya tomado demasiado medicamento. ATENCIN: [employer] es solo para usted. No comparta este medicamento con nadie. Qu sucede si me olvido de una dosis? Es importante no olvidar ninguna dosis. Informe a su mdico o a su profesional de la salud si no puede asistir a Photographer. Qu puede interactuar con este medicamento? Esta medicina puede interactuar con los siguientes medicamentos: -amiodarona -anfotericina B -azatioprina -ciertos medicamentos antivricos para el VIH o SIDA, tales como inhibidores de la proteasa (indinavir, ritonavir) y zidovudina -ciertos medicamentos para la presin sangunea, tales como benazepril, captopril, enalapril, fosinopril, lisinopril, moexipril, monopril, perindopril, quinapril, ramipril, trandolapril -ciertos medicamentos para el cncer tales como antraciclnicos (daunorrubicina, doxorrubicina), busulfn, citarabina, paclitaxel, pentostatina, tamoxifeno, trastuzumab -ciertos diurticos, tales como clorotiazida, clortalidona, hidroclorotiazida, indapamida, metolazona -ciertos medicamentos que tratan o previenen cogulos sanguneos, como warfarina -ciertos relajantes musculares, tales como succinilcolina -ciclosporina -etanercept -indometacina -medicamentos para incrementar los conteos sanguneos, tales como  filgrastim, pegfilgrastim, sargramostim -medicamentos utilizados para la anestesia general -metronidazol -natalizumab Puede ser que esta lista no menciona todas las posibles interacciones. Informe a su profesional de la salud de [employer] productos a base de hierbas, medicamentos de [place] o suplementos nutritivos  que est tomando. Si usted fuma, consume bebidas alcohlicas o si utiliza drogas ilegales, indqueselo tambin a su profesional de [employer]	[place]. Algunas sustancias pueden interactuar con su medicamento. A qu debo estar atento al usar [employer]? Visite a su mdico para chequear su evolucin. Este medicamento puede hacerle sentir un Nurse, mental health. Esto no es raro ya que la quimioterapia afecta tanto a las clulas sanas como a las clulas cancerosas. Si presenta alguno de los [employer], infrmelos. Sin embargo, contine con el tratamiento aun si se siente enfermo, a menos que su mdico le indique que lo suspenda. Beba agua u otros lquidos como se le haya indicado. Orine con frecuencia, especialmente de noche. En algunos casos, podr recibir [employer] para ayudarle con los efectos secundarios. Siga las instrucciones de [place]. Consulte a su mdico o a su profesional de la salud por asesoramiento si tiene fiebre, escalofros, dolor de garganta o cualquier otro sntoma de resfro o gripe. No se trate usted mismo. Este medicamento puede reducir la capacidad del cuerpo para combatir infecciones. Trate de no acercarse a personas que estn enfermas. [employer] puede aumentar el riesgo de magulladuras o sangrado. Consulte a su mdico o a su profesional de la salud si nota sangrados inusuales. Proceda con cuidado al cepillar sus dientes, usar hilo dental o Risk manager palillos para los dientes, ya que puede contraer una infeccin o Therapist, art con mayor facilidad. Si se somete a algn tratamiento dental, informe a su dentista que est recibiendo [employer]. Puede  experimentar mareos o somnolencia. No conduzca ni utilice maquinaria ni haga nada que Associate Professor en estado de alerta hasta que sepa cmo le afecta este medicamento. No se debe quedar embarazada mientras recibe este medicamento o durante 1 ao despus de terminarlo. Las mujeres deben informar a su mdico si estn buscando quedar embarazadas o si creen que estn embarazadas. Los hombres no deben tener hijos mientras estn recibiendo [employer] y durante 4 meses despus de terminarlo. Existe la posibilidad de efectos secundarios graves a un beb sin nacer. Para ms informacin hable con su profesional de la salud o su farmacutico. No debe Economist a un beb mientras est usando este medicamento. Este medicamento puede interferir con la capacidad de tener hijos. En algunas mujeres, este medicamento ha causado insuficiencia ovrica. En algunos hombres, este medicamento ha causado reduccin de los conteos de [place]. Usted debe consultarse con su mdico o su profesional de la salud si est preocupada por su fertilidad. Si va a someterse a una operacin, informe a su mdico o a su profesional de la salud que ha [employer]. Qu efectos secundarios puedo tener al [employer] este medicamento? Efectos secundarios que debe informar a su mdico o a Barrister's clerk de la salud tan pronto como sea posible: -reacciones alrgicas como erupcin cutnea, picazn o urticarias, hinchazn de la cara, labios o lengua -conteos sanguneos bajos - este medicamento puede reducir la cantidad de glbulos blancos, glbulos rojos y plaquetas. Su riesgo de infeccin y Teacher, music puede ser mayor. -signos de infeccin - fiebre o escalofros, tos, dolor de garganta, Social research officer, government o dificultad para Garment/textile technologist -signos de reduccin de plaquetas o sangrado - magulladuras, puntos rojos en la piel, heces de color oscuro o con aspecto alquitranado, sangre en la orina -signos de reduccin de glbulos rojos - cansancio o debilidad  inusual, desmayos, aturdimiento -problemas respiratorios -orina oscura -mareos -palpitaciones -hinchazn de tobillos, pies o manos -dificultad para orinar o cambios en el volumen de orina -aumento de [place] -color  amarillento de los ojos o la piel Efectos secundarios que, por lo general, no requieren Geophysical data processor (debe informarlos a su mdico o a su profesional de la salud si persisten o si son molestos): -cambios en el color de las uas o piel -cada del cabello -ausencia de perodos menstruales -llagas en la boca -nuseas, vmito Puede ser que [employer] no menciona todos los posibles efectos secundarios. Comunquese a su mdico por asesoramiento mdico [employer] Usted puede informar los efectos secundarios a la FDA por telfono al 1-800-FDA-1088. Dnde debo guardar mi medicina? Este medicamento se administra en hospitales o clnicas y no necesitar guardarlo en su domicilio. ATENCIN: Este folleto es un resumen. Puede ser que no cubra toda la posible informacin. Si usted tiene preguntas acerca de esta medicina, consulte con su mdico, su farmacutico o su profesional de Technical sales engineer.    2016, Elsevier/Gold Standard. (2014-12-08 00:00:00)   Doxorubicin injection Qu es este medicamento? La DOXORRUBICINA es un agente quimioteraputico. Se utiliza para el tratamiento de muchos tipos de cnceres, como enfermedad de Hodgkin, leucemia, linfoma no Hodgkin, neuroblastoma, sarcoma y el tumor de [place]. Se utiliza tambin para el tratamiento del cncer de vejiga, mama, pulmn, ovarios, estmago, y tiroides. Este medicamento puede ser utilizado para otros usos; si tiene alguna pregunta consulte con su proveedor de atencin mdica o con su farmacutico. Qu le debo informar a mi profesional de la salud antes de tomar este medicamento? Necesita saber si usted presenta alguno de los siguientes problemas o situaciones: -trastornos sanguneos -enfermedad cardiaca, ataque  cardiaco reciente -infeccin (especialmente infecciones virales, como varicela o herpes) -latidos cardacos irregulares -enfermedad heptica -radioterapia reciente o continuada -una reaccin alrgica o inusual a la doxorrubicina, a otros agentes quimioteraputicos, a otros medicamentos, alimentos, colorantes o conservantes -si est embarazada o buscando quedar embarazada -si est amamantando a un beb Cmo debo utilizar este medicamento? Este medicamento se administra mediante infusin por va intravenosa. Lo administra un profesional de la salud calificado en un hospital o en un entorno clnico. Si experimenta dolor, hinchazn, ardor o cualquier sensacin inusual alrededor del lugar de la inyeccin, informe inmediatamente a su profesional de [employer]	[place]. Hable con su pediatra para informarse acerca del uso de este medicamento en nios. Puede requerir atencin especial. Sobredosis: Pngase en contacto inmediatamente con un centro toxicolgico o una sala de urgencia si usted cree que haya tomado demasiado medicamento. ATENCIN: [employer] es solo para usted. No comparta este medicamento con nadie. Qu sucede si me olvido de una dosis? Es importante no olvidar ninguna dosis. Informe a su mdico o a su profesional de la salud si no puede asistir a Photographer. Qu puede interactuar con este medicamento? No tome esta medicina con ninguno de los siguientes medicamentos: -cisapride -droperidol -halofantrina -pimozida -zidovudina Esta medicina tambin puede interactuar con los siguientes medicamentos: -cloroquina -clorpromacina -claritromicina -ciclofosfamida -ciclosporina -eritromicina -medicamentos para la depresin, ansiedad o trastornos psicticos -medicamentos utilizados para el pulso cardiaco irregular, tales como amiodarona, bepridil, dofetilida, encainida, flecainida, propafenona, quinidina -medicamentos para convulsiones, tales como etotona, fosfenitona, fenitona -medicamentos  para las nuseas, vmito, tales como dolasetrn, [place], Contractor -medicamentos para incrementar los conteos sanguneos, tales como filgrastim, pegfilgrastim, sargramostim -metadona -metotrexato -pentamidina -progesterona -vacunas -verapamilo Consulte a su mdico o a su profesional de la salud antes de tomar cualquiera de los siguientes medicamentos: -acetaminofeno -aspirina -ibuprofeno -quetoprofeno -naproxeno Puede ser que esta lista no menciona todas las posibles interacciones. Informe a su profesional de la salud de [employer] productos a base  de hierbas, medicamentos de venta libre o suplementos nutritivos que est tomando. Si usted fuma, consume bebidas alcohlicas o si utiliza drogas ilegales, indqueselo tambin a su profesional de [employer]	[place]. Algunas sustancias pueden interactuar con su medicamento. A qu debo estar atento al usar [employer]? Se supervisar su condicin atentamente mientras reciba este medicamento. Tendr que hacerse anlisis de sangre peridicos mientras recibe [place]. Este medicamento puede hacerle sentir un Nurse, mental health. Esto es normal ya que la quimioterapia afecta tanto a las clulas sanas como a las clulas cancerosas. Si presenta alguno de los [employer], infrmelos. Sin embargo, contine con el tratamiento aun si se siente enfermo, a menos que su mdico le indique que lo suspenda. Despus de varios das de [place], [place] orina puede ser de color rojo. Esto no significa sangre en la orina. Si su orina es un color oscuro o Forensic psychologist, comunquese con su mdico. En algunos casos, podr recibir [employer] para ayudarle con los efectos secundarios. [place] para usar. Consulte a su mdico o a su profesional de la salud por asesoramiento si tiene fiebre, escalofros, dolor de garganta o cualquier otro sntoma de resfro o gripe. No se trate usted mismo. Este medicamento puede reducir la capacidad del cuerpo  para combatir infecciones. Trate de no acercarse a personas que estn enfermas. [employer] puede aumentar el riesgo de magulladuras o sangrado. Consulte a su mdico o a su profesional de la salud si observa sangrados inusuales. Proceda con cuidado al cepillar sus dientes, usar hilo dental o Risk manager palillos para los dientes, ya que puede contraer una infeccin o Therapist, art con mayor facilidad. Si se somete a algn tratamiento dental, informe a su dentista que est [employer]. Evite tomar productos que contienen aspirina, acetaminofeno, ibuprofeno, naproxeno o quetoprofeno a menos que as lo indique su mdico. Estos productos pueden disimular la fiebre. Los hombres y las mujeres en edad de procrear deben Risk manager mtodos anticonceptivos eficaces mientras reciben este medicamento. No se debe quedar embarazada mientras reciben [employer]. Existe la posibilidad de efectos secundarios graves a un beb sin nacer. Para ms informacin hable con su profesional de la salud o su farmacutico. No debe amamantar a un beb mientras est tomando este medicamento. Evite que otras personas tocan su orina u otros fluidos corporales por lo menos 5 das despus de cada tratamiento. Quienes cuidan de los pacientes deben Risk manager guantes de ltex para English as a second language teacher contacto con la [place] y otros fluidos corporales durante este tiempo. Existe una cantidad mxima de este medicamento que debe recibir a lo largo de su vida. La cantidad depende de la afeccin mdica siendo tratado y su salud en general. Su mdico observar la cantidad de este medicamento que usted recibe en su vida. Informe a su mdico si usted ha tomado este medicamento antes. Qu efectos secundarios puedo tener al [employer] este medicamento? Efectos secundarios que debe informar a su mdico o a Barrister's clerk de la salud tan pronto como sea posible: -reacciones alrgicas como erupcin cutnea, picazn o urticarias, hinchazn de la cara, labios o  lengua -conteos sanguneos bajos - este medicamento puede reducir la cantidad de glbulos blancos, glbulos rojos y plaquetas. Su riesgo de infeccin y [place]. -signos de infeccin - fiebre o escalofros, tos, dolor de garganta, Social research officer, government o dificultad para orinar -signos de reduccin de plaquetas o sangrado - magulladuras, puntos rojos en la piel, heces de color oscuro o con aspecto alquitranado, sangre en la orina -signos de reduccin de glbulos  rojos - cansancio o debilidad inusual, desmayos, sensacin de mareo -problemas respiratorios -dolor en el pecho -pulso cardiaco rpido, irregular -llagas en la boca -nuseas, vmito -dolor, enrojecimiento, hinchazn en el lugar de la inyeccin -dolor, hormigueo, entumecimiento de manos o pies -hinchazn de tobillos, pies o manos -sangrado, magulladuras inusuales Efectos secundarios que, por lo general, no requieren Geophysical data processor (debe informarlos a su mdico o a su profesional de la salud si persisten o si son molestos): -diarrea -enrojecimiento de la cara -cada del cabello -prdida del apetito -ausencia de perodos menstruales -dao o Glass blower/designer de las uas -ojos rojos o llorosos -color rojo de la orina -Higher education careers adviser Puede ser que esta lista no menciona todos los posibles efectos secundarios. Comunquese a su mdico por asesoramiento mdico [employer] Usted puede informar los efectos secundarios a la FDA por telfono al 1-800-FDA-1088. Dnde debo guardar mi medicina? Este medicamento se administra en hospitales o clnicas y no necesitar guardarlo en su domicilio. ATENCIN: Este folleto es un resumen. Puede ser que no cubra toda la posible informacin. Si usted tiene preguntas acerca de esta medicina, consulte con su mdico, su farmacutico o su profesional de Technical sales engineer.    2016, Elsevier/Gold Standard. (2014-12-08 00:00:00)

## 2016-01-12 NOTE — Progress Notes (Signed)
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 Today is cycle 2 of dose dense Adriamycin and Cytoxan. She had felt well the last week. Denies any nausea or vomiting. Energy levels are back to normal. Her husband is complaining that he is not able to keep up with her walking and exercise. She denies any fevers or chills.  REVIEW OF SYSTEMS:   Constitutional: Denies fevers, chills or abnormal weight loss Eyes: Denies blurriness of vision Ears, nose, mouth, throat, and face: Denies mucositis or sore throat Respiratory: Denies cough, dyspnea or wheezes Cardiovascular: Denies palpitation, chest discomfort Gastrointestinal:  Denies nausea, heartburn or change in bowel habits Skin: Denies abnormal skin rashes Lymphatics: Denies new lymphadenopathy or easy bruising Neurological:Denies numbness, tingling or new weaknesses Behavioral/Psych: Mood is stable, no new changes  Extremities: No lower extremity edema Breast:  denies any pain or lumps or nodules in either breasts All other systems were reviewed with the patient and are  negative.  I have reviewed the past medical history, past surgical history, social history and family history with the patient and they are unchanged from previous note.  ALLERGIES:  has No Known Allergies.  MEDICATIONS:  Current Outpatient Prescriptions  Medication Sig Dispense Refill  . Ascorbic Acid (VITAMIN C) 1000 MG tablet Take 1,000 mg by mouth daily.    . calcium carbonate (OS-CAL) 600 MG TABS Take 600 mg by mouth 2 (two) times daily with a meal.    . cholecalciferol (VITAMIN D) 1000 UNITS tablet Take 1,000 Units by mouth daily.    Marland Kitchen dexamethasone (DECADRON) 4 MG tablet Take 1 tablet (4 mg total) by mouth daily. Take 1 tab daily x 2 days 15 tablet 0  . LORazepam (ATIVAN) 1 MG tablet Take 1 tablet (1 mg total) by mouth every 8 (eight) hours as needed for anxiety (or nausea). 30 tablet 0  . ondansetron (ZOFRAN) 8 MG tablet Take 1 tablet (8 mg total) by mouth 2 (two) times daily as needed. Start on the third day after chemotherapy. 30 tablet 1  . oxyCODONE-acetaminophen (ROXICET) 5-325 MG tablet Take 1-2 tablets by mouth every 4 (four) hours as needed. 50 tablet 0  . prochlorperazine (COMPAZINE) 10 MG tablet Take 1 tablet (10 mg total) by mouth every 6 (six) hours as needed (Nausea or vomiting). 30 tablet 1  . VITAMIN E COMPLEX PO Take by mouth.     No current facility-administered medications for this visit.   Facility-Administered Medications Ordered in Other Visits  Medication Dose Route Frequency Provider Last Rate Last Dose  . cyclophosphamide (CYTOXAN) 960 mg in sodium chloride 0.9 %  250 mL chemo infusion  600 mg/m2 (Treatment Plan Actual) Intravenous Once Nicholas Lose, MD 596 mL/hr at 01/12/16 1050 960 mg at 01/12/16 1050  . heparin lock flush 100 unit/mL  500 Units Intracatheter Once PRN Nicholas Lose, MD      . pegfilgrastim (NEULASTA ONPRO KIT) injection 6 mg  6 mg Subcutaneous Once Nicholas Lose, MD      . sodium chloride flush (NS) 0.9 % injection 10 mL  10 mL Intracatheter  PRN Nicholas Lose, MD        PHYSICAL EXAMINATION: ECOG PERFORMANCE STATUS: 0 - Asymptomatic  Filed Vitals:   01/12/16 0835  BP: 118/70  Pulse: 90  Temp: 97.5 F (36.4 C)  Resp: 18   Filed Weights   01/12/16 0835  Weight: 137 lb 11.2 oz (62.46 kg)    GENERAL:alert, no distress and comfortable SKIN: skin color, texture, turgor are normal, no rashes or significant lesions EYES: normal, Conjunctiva are pink and non-injected, sclera clear OROPHARYNX:no exudate, no erythema and lips, buccal mucosa, and tongue normal  NECK: supple, thyroid normal size, non-tender, without nodularity LYMPH:  no palpable lymphadenopathy in the cervical, axillary or inguinal LUNGS: clear to auscultation and percussion with normal breathing effort HEART: regular rate & rhythm and no murmurs and no lower extremity edema ABDOMEN:abdomen soft, non-tender and normal bowel sounds MUSCULOSKELETAL:no cyanosis of digits and no clubbing  NEURO: alert & oriented x 3 with fluent speech, no focal motor/sensory deficits EXTREMITIES: No lower extremity edema  LABORATORY DATA:  I have reviewed the data as listed   Chemistry      Component Value Date/Time   NA 142 01/12/2016 0825   K 4.7 01/12/2016 0825   CO2 31* 01/12/2016 0825   BUN 8.7 01/12/2016 0825   CREATININE 0.8 01/12/2016 0825      Component Value Date/Time   CALCIUM 9.9 01/12/2016 0825   ALKPHOS 117 01/12/2016 0825   AST 25 01/12/2016 0825   ALT 37 01/12/2016 0825   BILITOT <0.30 01/12/2016 0825     Lab Results  Component Value Date   WBC 11.1* 01/12/2016   HGB 14.3 01/12/2016   HCT 44.0 01/12/2016   MCV 90.7 01/12/2016   PLT 217 01/12/2016   NEUTROABS 8.0* 01/12/2016   ASSESSMENT & PLAN:  Breast cancer of upper-outer quadrant of left female breast ([place]) Lt Lumpectomy 11/22/15: IDC 1.5 cm, 0/2 LN, grade 2, with Int Grade DCIS, ER 100%, PR 30%, Her 2 Neg Ratio 1.91, T1cN0 (stage 1A), Oncotype DX recurrence score 37, 25% 10 year risk of  recurrence  Recommendation:  1. Systemic chemotherapy with dose dense Adriamycin and Cytoxan 4 followed by Abraxane weekly 12 2. Adjuvant radiation therapy followed by 3. Adjuvant antiestrogen therapy -------------------------------------------------------------------------------------------------------------------------- Current Treatment: Cycle 2 day 1 of Dose Dense Adriamycin and Cytoxan ECHO: 12/23/15: EF 55-60% Chemotherapy toxicities: 1. Bone discomfort from Neulasta 2. Nausea grade 1 3. Fatigue grade 1  Monitoring closely for toxicities  RTC in 2 weeks for cycle 3  No orders of the defined types were placed in this encounter.   The patient has a good understanding of the overall plan. she agrees with it. she will call with any problems that may develop before the next visit here.   Rulon Eisenmenger, MD 01/12/2016

## 2016-01-17 ENCOUNTER — Encounter: Payer: BLUE CROSS/BLUE SHIELD | Admitting: Gynecology

## 2016-01-25 ENCOUNTER — Other Ambulatory Visit: Payer: Self-pay

## 2016-01-25 DIAGNOSIS — C50412 Malignant neoplasm of upper-outer quadrant of left female breast: Secondary | ICD-10-CM

## 2016-01-25 NOTE — Assessment & Plan Note (Signed)
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 Systemic chemotherapy with dose dense Adriamycin and Cytoxan 4 followed by Abraxane weekly 12 2. Adjuvant radiation therapy followed by 3. Adjuvant antiestrogen therapy -------------------------------------------------------------------------------------------------------------------------- Current Treatment: Cycle 3 day 1 of Dose Dense Adriamycin and Cytoxan ECHO: 12/23/15: EF 55-60% Chemotherapy toxicities: 1. Bone discomfort from Neulasta 2. Nausea grade 1 3. Fatigue grade 1  Monitoring closely for toxicities  RTC in 2 weeks for cycle 4

## 2016-01-26 ENCOUNTER — Other Ambulatory Visit (HOSPITAL_BASED_OUTPATIENT_CLINIC_OR_DEPARTMENT_OTHER): Payer: BLUE CROSS/BLUE SHIELD

## 2016-01-26 ENCOUNTER — Encounter: Payer: Self-pay | Admitting: *Deleted

## 2016-01-26 ENCOUNTER — Telehealth: Payer: Self-pay | Admitting: Hematology and Oncology

## 2016-01-26 ENCOUNTER — Encounter: Payer: Self-pay | Admitting: Hematology and Oncology

## 2016-01-26 ENCOUNTER — Ambulatory Visit (HOSPITAL_BASED_OUTPATIENT_CLINIC_OR_DEPARTMENT_OTHER): Payer: BLUE CROSS/BLUE SHIELD | Admitting: Hematology and Oncology

## 2016-01-26 ENCOUNTER — Ambulatory Visit (HOSPITAL_BASED_OUTPATIENT_CLINIC_OR_DEPARTMENT_OTHER): Payer: BLUE CROSS/BLUE SHIELD

## 2016-01-26 VITALS — BP 105/65 | HR 86 | Temp 97.8°F | Resp 18 | Wt 135.8 lb

## 2016-01-26 DIAGNOSIS — C50412 Malignant neoplasm of upper-outer quadrant of left female breast: Secondary | ICD-10-CM

## 2016-01-26 DIAGNOSIS — Z5111 Encounter for antineoplastic chemotherapy: Secondary | ICD-10-CM

## 2016-01-26 LAB — CBC WITH DIFFERENTIAL/PLATELET
BASO%: 1.3 % (ref 0.0–2.0)
Basophils Absolute: 0.1 10*3/uL (ref 0.0–0.1)
EOS%: 0.6 % (ref 0.0–7.0)
Eosinophils Absolute: 0.1 10*3/uL (ref 0.0–0.5)
HCT: 37.9 % (ref 34.8–46.6)
HGB: 12.9 g/dL (ref 11.6–15.9)
LYMPH%: 17.6 % (ref 14.0–49.7)
MCH: 30.5 pg (ref 25.1–34.0)
MCHC: 34 g/dL (ref 31.5–36.0)
MCV: 89.6 fL (ref 79.5–101.0)
MONO#: 0.8 10*3/uL (ref 0.1–0.9)
MONO%: 8.3 % (ref 0.0–14.0)
NEUT#: 7.2 10*3/uL — ABNORMAL HIGH (ref 1.5–6.5)
NEUT%: 72.2 % (ref 38.4–76.8)
Platelets: 312 10*3/uL (ref 145–400)
RBC: 4.23 10*6/uL (ref 3.70–5.45)
RDW: 14.5 % (ref 11.2–14.5)
WBC: 10 10*3/uL (ref 3.9–10.3)
lymph#: 1.8 10*3/uL (ref 0.9–3.3)

## 2016-01-26 LAB — COMPREHENSIVE METABOLIC PANEL
ALT: 36 U/L (ref 0–55)
AST: 29 U/L (ref 5–34)
Albumin: 3.6 g/dL (ref 3.5–5.0)
Alkaline Phosphatase: 103 U/L (ref 40–150)
Anion Gap: 9 mEq/L (ref 3–11)
BUN: 8.5 mg/dL (ref 7.0–26.0)
CO2: 29 mEq/L (ref 22–29)
Calcium: 9.8 mg/dL (ref 8.4–10.4)
Chloride: 105 mEq/L (ref 98–109)
Creatinine: 0.8 mg/dL (ref 0.6–1.1)
EGFR: 85 mL/min/{1.73_m2} — ABNORMAL LOW (ref >90)
Glucose: 83 mg/dl (ref 70–140)
Potassium: 4.2 mEq/L (ref 3.5–5.1)
Sodium: 143 mEq/L (ref 136–145)
Total Bilirubin: <0.3 mg/dL (ref 0.20–1.20)
Total Protein: 7.5 g/dL (ref 6.4–8.3)

## 2016-01-26 MED ORDER — CYCLOPHOSPHAMIDE CHEMO INJECTION 1 GM
600.0000 mg/m2 | Freq: Once | INTRAMUSCULAR | Status: AC
  Administered 2016-01-26: 960 mg via INTRAVENOUS
  Filled 2016-01-26: qty 48

## 2016-01-26 MED ORDER — SODIUM CHLORIDE 0.9% FLUSH
10.0000 mL | INTRAVENOUS | Status: DC | PRN
  Administered 2016-01-26: 10 mL
  Filled 2016-01-26: qty 10

## 2016-01-26 MED ORDER — HEPARIN SOD (PORK) LOCK FLUSH 100 UNIT/ML IV SOLN
500.0000 [IU] | Freq: Once | INTRAVENOUS | Status: AC | PRN
  Administered 2016-01-26: 500 [IU]
  Filled 2016-01-26: qty 5

## 2016-01-26 MED ORDER — PALONOSETRON HCL INJECTION 0.25 MG/5ML
0.2500 mg | Freq: Once | INTRAVENOUS | Status: AC
Start: 2016-01-26 — End: 2016-01-26
  Administered 2016-01-26: 0.25 mg via INTRAVENOUS

## 2016-01-26 MED ORDER — PALONOSETRON HCL INJECTION 0.25 MG/5ML
INTRAVENOUS | Status: AC
  Filled 2016-01-26: qty 5

## 2016-01-26 MED ORDER — SODIUM CHLORIDE 0.9 % IV SOLN
Freq: Once | INTRAVENOUS | Status: AC
  Administered 2016-01-26: 11:00:00 via INTRAVENOUS

## 2016-01-26 MED ORDER — FOSAPREPITANT DIMEGLUMINE INJECTION 150 MG
Freq: Once | INTRAVENOUS | Status: AC
  Administered 2016-01-26: 11:00:00 via INTRAVENOUS
  Filled 2016-01-26: qty 5

## 2016-01-26 MED ORDER — PEGFILGRASTIM 6 MG/0.6ML ~~LOC~~ PSKT
6.0000 mg | PREFILLED_SYRINGE | Freq: Once | SUBCUTANEOUS | Status: AC
  Administered 2016-01-26: 6 mg via SUBCUTANEOUS
  Filled 2016-01-26: qty 0.6

## 2016-01-26 MED ORDER — DOXORUBICIN HCL CHEMO IV INJECTION 2 MG/ML
60.0000 mg/m2 | Freq: Once | INTRAVENOUS | Status: AC
  Administered 2016-01-26: 96 mg via INTRAVENOUS
  Filled 2016-01-26: qty 48

## 2016-01-26 NOTE — Progress Notes (Signed)
Patient Care Team: Pcp Not In System as PCP - General  DIAGNOSIS: No matching staging information was found for the patient.  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 She appears to be tolerating chemotherapy extremely well. She had one day of nausea and fatigue for a couple of days. She did not have any major side effects. She lost her hair and is now wearing a wig. Her daughter came from [place] and spending the weekend with her.  REVIEW OF SYSTEMS:   Constitutional: Denies fevers, chills or abnormal weight loss Eyes: Denies blurriness of vision Ears, nose, mouth, throat, and face: Denies mucositis or sore throat Respiratory: Denies cough, dyspnea or wheezes Cardiovascular: Denies palpitation, chest discomfort Gastrointestinal:  Denies nausea, heartburn or change in bowel habits Skin: Denies abnormal skin rashes Lymphatics: Denies new lymphadenopathy or easy bruising Neurological:Denies numbness, tingling or new weaknesses Behavioral/Psych: Mood is stable, no new changes  Extremities: No lower extremity  edema Breast:  denies any pain or lumps or nodules in either breasts All other systems were reviewed with the patient and are negative.  I have reviewed the past medical history, past surgical history, social history and family history with the patient and they are unchanged from previous note.  ALLERGIES:  has No Known Allergies.  MEDICATIONS:  Current Outpatient Prescriptions  Medication Sig Dispense Refill  . Ascorbic Acid (VITAMIN C) 1000 MG tablet Take 1,000 mg by mouth daily.    . calcium carbonate (OS-CAL) 600 MG TABS Take 600 mg by mouth 2 (two) times daily with a meal.    . cholecalciferol (VITAMIN D) 1000 UNITS tablet Take 1,000 Units by mouth daily.    Marland Kitchen dexamethasone (DECADRON) 4 MG tablet Take 1 tablet (4 mg total) by mouth daily. Take 1 tab daily x 2 days 15 tablet 0  . LORazepam (ATIVAN) 1 MG tablet Take 1 tablet (1 mg total) by mouth every 8 (eight) hours as needed for anxiety (or nausea). 30 tablet 0  . ondansetron (ZOFRAN) 8 MG tablet Take 1 tablet (8 mg total) by mouth 2 (two) times daily as needed. Start on the third day after chemotherapy. 30 tablet 1  . oxyCODONE-acetaminophen (ROXICET) 5-325 MG tablet Take 1-2 tablets by mouth every 4 (four) hours as needed. 50 tablet 0  . prochlorperazine (COMPAZINE) 10 MG tablet Take 1 tablet (10 mg total) by mouth every 6 (six) hours as needed (Nausea or vomiting). 30 tablet 1  . VITAMIN E COMPLEX PO Take by mouth.     No current facility-administered medications for this visit.    PHYSICAL EXAMINATION: ECOG PERFORMANCE STATUS: 1 -  Symptomatic but completely ambulatory  Filed Vitals:   01/26/16 0946  BP: 105/65  Pulse: 86  Temp: 97.8 F (36.6 C)  Resp: 18   Filed Weights   01/26/16 0946  Weight: 135 lb 12.8 oz (61.598 kg)    GENERAL:alert, no distress and comfortable SKIN: skin color, texture, turgor are normal, no rashes or significant lesions EYES: normal, Conjunctiva are pink and non-injected, sclera  clear OROPHARYNX:no exudate, no erythema and lips, buccal mucosa, and tongue normal  NECK: supple, thyroid normal size, non-tender, without nodularity LYMPH:  no palpable lymphadenopathy in the cervical, axillary or inguinal LUNGS: clear to auscultation and percussion with normal breathing effort HEART: regular rate & rhythm and no murmurs and no lower extremity edema ABDOMEN:abdomen soft, non-tender and normal bowel sounds MUSCULOSKELETAL:no cyanosis of digits and no clubbing  NEURO: alert & oriented x 3 with fluent speech, no focal motor/sensory deficits EXTREMITIES: No lower extremity edema  LABORATORY DATA:  I have reviewed the data as listed   Chemistry      Component Value Date/Time   NA 142 01/12/2016 0825   K 4.7 01/12/2016 0825   CO2 31* 01/12/2016 0825   BUN 8.7 01/12/2016 0825   CREATININE 0.8 01/12/2016 0825      Component Value Date/Time   CALCIUM 9.9 01/12/2016 0825   ALKPHOS 117 01/12/2016 0825   AST 25 01/12/2016 0825   ALT 37 01/12/2016 0825   BILITOT <0.30 01/12/2016 0825       Lab Results  Component Value Date   WBC 10.0 01/26/2016   HGB 12.9 01/26/2016   HCT 37.9 01/26/2016   MCV 89.6 01/26/2016   PLT 312 01/26/2016   NEUTROABS 7.2* 01/26/2016   ASSESSMENT & PLAN:  Breast cancer of upper-outer quadrant of left female breast ([place]) Lt Lumpectomy 11/22/15: IDC 1.5 cm, 0/2 LN, grade 2, with Int Grade DCIS, ER 100%, PR 30%, Her 2 Neg Ratio 1.91, T1cN0 (stage 1A), Oncotype DX recurrence score 37, 25% 10 year risk of recurrence  Recommendation:  1. Systemic chemotherapy with dose dense Adriamycin and Cytoxan 4 followed by Abraxane weekly 12 2. Adjuvant radiation therapy followed by 3. Adjuvant antiestrogen therapy -------------------------------------------------------------------------------------------------------------------------- Current Treatment: Cycle 3 day 1 of Dose Dense Adriamycin and Cytoxan ECHO: 12/23/15: EF 55-60% Chemotherapy  toxicities: 1. Bone discomfort from Neulasta 2. Nausea grade 1 3. Fatigue grade 1  Monitoring closely for toxicities  RTC in 2 weeks for cycle 4 and I will see her with cycle 1 of Abraxane.   No orders of the defined types were placed in this encounter.   The patient has a good understanding of the overall plan. she agrees with it. she will call with any problems that may develop before the next visit here.   Rulon Eisenmenger, MD 01/26/2016

## 2016-01-26 NOTE — Telephone Encounter (Signed)
appt made and avs will print in treatment room °

## 2016-01-26 NOTE — Progress Notes (Signed)
Unable to get in to exam room prior to MD.  No assessment performed.  

## 2016-01-26 NOTE — Patient Instructions (Signed)
[place] Discharge Instructions for Patients Receiving Chemotherapy  Today you received the following chemotherapy agents Adriamycin/ Cytoxan  To help prevent nausea and vomiting after your treatment, we encourage you to take your nausea medication as directedIf you develop nausea and vomiting that is not controlled by your nausea medication, call the clinic.   BELOW ARE SYMPTOMS THAT SHOULD BE REPORTED IMMEDIATELY:  *FEVER GREATER THAN 100.5 F  *CHILLS WITH OR WITHOUT FEVER  NAUSEA AND VOMITING THAT IS NOT CONTROLLED WITH YOUR NAUSEA MEDICATION  *UNUSUAL SHORTNESS OF BREATH  *UNUSUAL BRUISING OR BLEEDING  TENDERNESS IN MOUTH AND THROAT WITH OR WITHOUT PRESENCE OF ULCERS  *URINARY PROBLEMS  *BOWEL PROBLEMS  UNUSUAL RASH Items with * indicate a potential emergency and should be followed up as soon as possible.  Feel free to call the clinic you have any questions or concerns. The clinic phone number is [phone].  Please show the [place] at check-in to the Emergency Department and triage nurse.    Cyclophosphamide injection Qu es este medicamento? La CICLOFOSFAMIDA es un agente quimioteraputico. Este medicamento reduce el crecimiento de las clulas cancerosas. Este medicamento se [place] en el tratamiento de varios tipos de cncer como linfoma, mieloma, leucemia, cncer de mama y cncer de ovarios, por ejemplo. Este medicamento puede ser utilizado para otros usos; si tiene alguna pregunta consulte con su proveedor de atencin mdica o con su farmacutico. Qu le debo informar a mi profesional de la salud antes de tomar este medicamento? Necesita saber si usted presenta alguno de los siguientes problemas o situaciones: -trastorno sanguneo -antecedentes de quimioterapia -infeccin -enfermedad renal -enfermedad heptica -radioterapia reciente o en curso -tumors en la mdula sea -una reaccin alrgica o inusual a la ciclofosfamida, a otros  agentes quimioteraputicos, a otros medicamentos, alimentos, colorantes o conservantes -si est embarazada o buscando quedar embarazada -si est amamantando a un beb Cmo debo utilizar este medicamento? Este medicamento normalmente se administra mediante inyeccin por va intravenosa o intramuscular o infusin por va intravenosa. Lo administra un profesional de la salud calificado en un hospital o en un entorno clnico. Hable con su pediatra para informarse acerca del uso de este medicamento en nios. Puede requerir atencin especial. Sobredosis: Pngase en contacto inmediatamente con un centro toxicolgico o una sala de urgencia si usted cree que haya tomado demasiado medicamento. ATENCIN: [employer] es solo para usted. No comparta este medicamento con nadie. Qu sucede si me olvido de una dosis? Es importante no olvidar ninguna dosis. Informe a su mdico o a su profesional de la salud si no puede asistir a Photographer. Qu puede interactuar con este medicamento? Esta medicina puede interactuar con los siguientes medicamentos: -amiodarona -anfotericina B -azatioprina -ciertos medicamentos antivricos para el VIH o SIDA, tales como inhibidores de la proteasa (indinavir, ritonavir) y zidovudina -ciertos medicamentos para la presin sangunea, tales como benazepril, captopril, enalapril, fosinopril, lisinopril, moexipril, monopril, perindopril, quinapril, ramipril, trandolapril -ciertos medicamentos para el cncer tales como antraciclnicos (daunorrubicina, doxorrubicina), busulfn, citarabina, paclitaxel, pentostatina, tamoxifeno, trastuzumab -ciertos diurticos, tales como clorotiazida, clortalidona, hidroclorotiazida, indapamida, metolazona -ciertos medicamentos que tratan o previenen cogulos sanguneos, como warfarina -ciertos relajantes musculares, tales como succinilcolina -ciclosporina -etanercept -indometacina -medicamentos para incrementar los conteos sanguneos, tales como  filgrastim, pegfilgrastim, sargramostim -medicamentos utilizados para la anestesia general -metronidazol -natalizumab Puede ser que esta lista no menciona todas las posibles interacciones. Informe a su profesional de la salud de [employer] productos a base de hierbas, medicamentos de [place] o suplementos nutritivos  que est tomando. Si usted fuma, consume bebidas alcohlicas o si utiliza drogas ilegales, indqueselo tambin a su profesional de [employer]	[place]. Algunas sustancias pueden interactuar con su medicamento. A qu debo estar atento al usar [employer]? Visite a su mdico para chequear su evolucin. Este medicamento puede hacerle sentir un Nurse, mental health. Esto no es raro ya que la quimioterapia afecta tanto a las clulas sanas como a las clulas cancerosas. Si presenta alguno de los [employer], infrmelos. Sin embargo, contine con el tratamiento aun si se siente enfermo, a menos que su mdico le indique que lo suspenda. Beba agua u otros lquidos como se le haya indicado. Orine con frecuencia, especialmente de noche. En algunos casos, podr recibir [employer] para ayudarle con los efectos secundarios. Siga las instrucciones de [place]. Consulte a su mdico o a su profesional de la salud por asesoramiento si tiene fiebre, escalofros, dolor de garganta o cualquier otro sntoma de resfro o gripe. No se trate usted mismo. Este medicamento puede reducir la capacidad del cuerpo para combatir infecciones. Trate de no acercarse a personas que estn enfermas. [employer] puede aumentar el riesgo de magulladuras o sangrado. Consulte a su mdico o a su profesional de la salud si nota sangrados inusuales. Proceda con cuidado al cepillar sus dientes, usar hilo dental o Risk manager palillos para los dientes, ya que puede contraer una infeccin o Therapist, art con mayor facilidad. Si se somete a algn tratamiento dental, informe a su dentista que est recibiendo [employer]. Puede  experimentar mareos o somnolencia. No conduzca ni utilice maquinaria ni haga nada que Associate Professor en estado de alerta hasta que sepa cmo le afecta este medicamento. No se debe quedar embarazada mientras recibe este medicamento o durante 1 ao despus de terminarlo. Las mujeres deben informar a su mdico si estn buscando quedar embarazadas o si creen que estn embarazadas. Los hombres no deben tener hijos mientras estn recibiendo [employer] y durante 4 meses despus de terminarlo. Existe la posibilidad de efectos secundarios graves a un beb sin nacer. Para ms informacin hable con su profesional de la salud o su farmacutico. No debe Economist a un beb mientras est usando este medicamento. Este medicamento puede interferir con la capacidad de tener hijos. En algunas mujeres, este medicamento ha causado insuficiencia ovrica. En algunos hombres, este medicamento ha causado reduccin de los conteos de [place]. Usted debe consultarse con su mdico o su profesional de la salud si est preocupada por su fertilidad. Si va a someterse a una operacin, informe a su mdico o a su profesional de la salud que ha [employer]. Qu efectos secundarios puedo tener al [employer] este medicamento? Efectos secundarios que debe informar a su mdico o a Barrister's clerk de la salud tan pronto como sea posible: -reacciones alrgicas como erupcin cutnea, picazn o urticarias, hinchazn de la cara, labios o lengua -conteos sanguneos bajos - este medicamento puede reducir la cantidad de glbulos blancos, glbulos rojos y plaquetas. Su riesgo de infeccin y Teacher, music puede ser mayor. -signos de infeccin - fiebre o escalofros, tos, dolor de garganta, Social research officer, government o dificultad para Garment/textile technologist -signos de reduccin de plaquetas o sangrado - magulladuras, puntos rojos en la piel, heces de color oscuro o con aspecto alquitranado, sangre en la orina -signos de reduccin de glbulos rojos - cansancio o debilidad  inusual, desmayos, aturdimiento -problemas respiratorios -orina oscura -mareos -palpitaciones -hinchazn de tobillos, pies o manos -dificultad para orinar o cambios en el volumen de orina -aumento de [place] -color  amarillento de los ojos o la piel Efectos secundarios que, por lo general, no requieren Geophysical data processor (debe informarlos a su mdico o a su profesional de la salud si persisten o si son molestos): -cambios en el color de las uas o piel -cada del cabello -ausencia de perodos menstruales -llagas en la boca -nuseas, vmito Puede ser que [employer] no menciona todos los posibles efectos secundarios. Comunquese a su mdico por asesoramiento mdico [employer] Usted puede informar los efectos secundarios a la FDA por telfono al 1-800-FDA-1088. Dnde debo guardar mi medicina? Este medicamento se administra en hospitales o clnicas y no necesitar guardarlo en su domicilio. ATENCIN: Este folleto es un resumen. Puede ser que no cubra toda la posible informacin. Si usted tiene preguntas acerca de esta medicina, consulte con su mdico, su farmacutico o su profesional de Technical sales engineer.    2016, Elsevier/Gold Standard. (2014-12-08 00:00:00)   Doxorubicin injection Qu es este medicamento? La DOXORRUBICINA es un agente quimioteraputico. Se utiliza para el tratamiento de muchos tipos de cnceres, como enfermedad de Hodgkin, leucemia, linfoma no Hodgkin, neuroblastoma, sarcoma y el tumor de [place]. Se utiliza tambin para el tratamiento del cncer de vejiga, mama, pulmn, ovarios, estmago, y tiroides. Este medicamento puede ser utilizado para otros usos; si tiene alguna pregunta consulte con su proveedor de atencin mdica o con su farmacutico. Qu le debo informar a mi profesional de la salud antes de tomar este medicamento? Necesita saber si usted presenta alguno de los siguientes problemas o situaciones: -trastornos sanguneos -enfermedad cardiaca, ataque  cardiaco reciente -infeccin (especialmente infecciones virales, como varicela o herpes) -latidos cardacos irregulares -enfermedad heptica -radioterapia reciente o continuada -una reaccin alrgica o inusual a la doxorrubicina, a otros agentes quimioteraputicos, a otros medicamentos, alimentos, colorantes o conservantes -si est embarazada o buscando quedar embarazada -si est amamantando a un beb Cmo debo utilizar este medicamento? Este medicamento se administra mediante infusin por va intravenosa. Lo administra un profesional de la salud calificado en un hospital o en un entorno clnico. Si experimenta dolor, hinchazn, ardor o cualquier sensacin inusual alrededor del lugar de la inyeccin, informe inmediatamente a su profesional de [employer]	[place]. Hable con su pediatra para informarse acerca del uso de este medicamento en nios. Puede requerir atencin especial. Sobredosis: Pngase en contacto inmediatamente con un centro toxicolgico o una sala de urgencia si usted cree que haya tomado demasiado medicamento. ATENCIN: [employer] es solo para usted. No comparta este medicamento con nadie. Qu sucede si me olvido de una dosis? Es importante no olvidar ninguna dosis. Informe a su mdico o a su profesional de la salud si no puede asistir a Photographer. Qu puede interactuar con este medicamento? No tome esta medicina con ninguno de los siguientes medicamentos: -cisapride -droperidol -halofantrina -pimozida -zidovudina Esta medicina tambin puede interactuar con los siguientes medicamentos: -cloroquina -clorpromacina -claritromicina -ciclofosfamida -ciclosporina -eritromicina -medicamentos para la depresin, ansiedad o trastornos psicticos -medicamentos utilizados para el pulso cardiaco irregular, tales como amiodarona, bepridil, dofetilida, encainida, flecainida, propafenona, quinidina -medicamentos para convulsiones, tales como etotona, fosfenitona, fenitona -medicamentos  para las nuseas, vmito, tales como dolasetrn, [place], Contractor -medicamentos para incrementar los conteos sanguneos, tales como filgrastim, pegfilgrastim, sargramostim -metadona -metotrexato -pentamidina -progesterona -vacunas -verapamilo Consulte a su mdico o a su profesional de la salud antes de tomar cualquiera de los siguientes medicamentos: -acetaminofeno -aspirina -ibuprofeno -quetoprofeno -naproxeno Puede ser que esta lista no menciona todas las posibles interacciones. Informe a su profesional de la salud de [employer] productos a base  de hierbas, medicamentos de venta libre o suplementos nutritivos que est tomando. Si usted fuma, consume bebidas alcohlicas o si utiliza drogas ilegales, indqueselo tambin a su profesional de [employer]	[place]. Algunas sustancias pueden interactuar con su medicamento. A qu debo estar atento al usar Coca[employer]a? Se supervisar su condicin atentamente mientras reciba este medicamento. Tendr que hacerse anlisis de sangre peridicos mientras recibe [place]. Este medicamento puede hacerle sentir un Nurse, mental health. Esto es normal ya que la quimioterapia afecta tanto a las clulas sanas como a las clulas cancerosas. Si presenta alguno de los [employer], infrmelos. Sin embargo, contine con el tratamiento aun si se siente enfermo, a menos que su mdico le indique que lo suspenda. Despus de varios das de [place], [place] orina puede ser de color rojo. Esto no significa sangre en la orina. Si su orina es un color oscuro o Forensic psychologist, comunquese con su mdico. En algunos casos, podr recibir [employer] para ayudarle con los efectos secundarios. [place] para usar. Consulte a su mdico o a su profesional de la salud por asesoramiento si tiene fiebre, escalofros, dolor de garganta o cualquier otro sntoma de resfro o gripe. No se trate usted mismo. Este medicamento puede reducir la capacidad del cuerpo  para combatir infecciones. Trate de no acercarse a personas que estn enfermas. [employer] puede aumentar el riesgo de magulladuras o sangrado. Consulte a su mdico o a su profesional de la salud si observa sangrados inusuales. Proceda con cuidado al cepillar sus dientes, usar hilo dental o Risk manager palillos para los dientes, ya que puede contraer una infeccin o Therapist, art con mayor facilidad. Si se somete a algn tratamiento dental, informe a su dentista que est [employer]. Evite tomar productos que contienen aspirina, acetaminofeno, ibuprofeno, naproxeno o quetoprofeno a menos que as lo indique su mdico. Estos productos pueden disimular la fiebre. Los hombres y las mujeres en edad de procrear deben Risk manager mtodos anticonceptivos eficaces mientras reciben este medicamento. No se debe quedar embarazada mientras reciben [employer]. Existe la posibilidad de efectos secundarios graves a un beb sin nacer. Para ms informacin hable con su profesional de la salud o su farmacutico. No debe amamantar a un beb mientras est tomando este medicamento. Evite que otras personas tocan su orina u otros fluidos corporales por lo menos 5 das despus de cada tratamiento. Quienes cuidan de los pacientes deben Risk manager guantes de ltex para English as a second language teacher contacto con la [place] y otros fluidos corporales durante este tiempo. Existe una cantidad mxima de este medicamento que debe recibir a lo largo de su vida. La cantidad depende de la afeccin mdica siendo tratado y su salud en general. Su mdico observar la cantidad de este medicamento que usted recibe en su vida. Informe a su mdico si usted ha tomado este medicamento antes. Qu efectos secundarios puedo tener al [employer] este medicamento? Efectos secundarios que debe informar a su mdico o a Barrister's clerk de la salud tan pronto como sea posible: -reacciones alrgicas como erupcin cutnea, picazn o urticarias, hinchazn de la cara, labios o  lengua -conteos sanguneos bajos - este medicamento puede reducir la cantidad de glbulos blancos, glbulos rojos y plaquetas. Su riesgo de infeccin y [place]. -signos de infeccin - fiebre o escalofros, tos, dolor de garganta, Social research officer, government o dificultad para orinar -signos de reduccin de plaquetas o sangrado - magulladuras, puntos rojos en la piel, heces de color oscuro o con aspecto alquitranado, sangre en la orina -signos de reduccin de glbulos  rojos - cansancio o debilidad inusual, desmayos, sensacin de mareo -problemas respiratorios -dolor en el pecho -pulso cardiaco rpido, irregular -llagas en la boca -nuseas, vmito -dolor, enrojecimiento, hinchazn en el lugar de la inyeccin -dolor, hormigueo, entumecimiento de manos o pies -hinchazn de tobillos, pies o manos -sangrado, magulladuras inusuales Efectos secundarios que, por lo general, no requieren Geophysical data processor (debe informarlos a su mdico o a su profesional de la salud si persisten o si son molestos): -diarrea -enrojecimiento de la cara -cada del cabello -prdida del apetito -ausencia de perodos menstruales -dao o Glass blower/designer de las uas -ojos rojos o llorosos -color rojo de la orina -Higher education careers adviser Puede ser que esta lista no menciona todos los posibles efectos secundarios. Comunquese a su mdico por asesoramiento mdico [employer] Usted puede informar los efectos secundarios a la FDA por telfono al 1-800-FDA-1088. Dnde debo guardar mi medicina? Este medicamento se administra en hospitales o clnicas y no necesitar guardarlo en su domicilio. ATENCIN: Este folleto es un resumen. Puede ser que no cubra toda la posible informacin. Si usted tiene preguntas acerca de esta medicina, consulte con su mdico, su farmacutico o su profesional de Technical sales engineer.    2016, Elsevier/Gold Standard. (2014-12-08 00:00:00)

## 2016-02-09 ENCOUNTER — Ambulatory Visit (HOSPITAL_BASED_OUTPATIENT_CLINIC_OR_DEPARTMENT_OTHER): Payer: BLUE CROSS/BLUE SHIELD

## 2016-02-09 ENCOUNTER — Other Ambulatory Visit (HOSPITAL_BASED_OUTPATIENT_CLINIC_OR_DEPARTMENT_OTHER): Payer: BLUE CROSS/BLUE SHIELD

## 2016-02-09 VITALS — BP 101/65 | HR 79 | Temp 98.2°F | Resp 16

## 2016-02-09 DIAGNOSIS — Z5111 Encounter for antineoplastic chemotherapy: Secondary | ICD-10-CM | POA: Diagnosis not present

## 2016-02-09 DIAGNOSIS — C50412 Malignant neoplasm of upper-outer quadrant of left female breast: Secondary | ICD-10-CM | POA: Diagnosis not present

## 2016-02-09 LAB — COMPREHENSIVE METABOLIC PANEL
ALT: 32 U/L (ref 0–55)
AST: 29 U/L (ref 5–34)
Albumin: 3.5 g/dL (ref 3.5–5.0)
Alkaline Phosphatase: 102 U/L (ref 40–150)
Anion Gap: 8 mEq/L (ref 3–11)
BUN: 6.1 mg/dL — ABNORMAL LOW (ref 7.0–26.0)
CO2: 28 mEq/L (ref 22–29)
Calcium: 9.9 mg/dL (ref 8.4–10.4)
Chloride: 104 mEq/L (ref 98–109)
Creatinine: 0.8 mg/dL (ref 0.6–1.1)
EGFR: 85 mL/min/{1.73_m2} — ABNORMAL LOW (ref >90)
Glucose: 84 mg/dl (ref 70–140)
Potassium: 4.4 mEq/L (ref 3.5–5.1)
Sodium: 140 mEq/L (ref 136–145)
Total Bilirubin: <0.3 mg/dL (ref 0.20–1.20)
Total Protein: 7.5 g/dL (ref 6.4–8.3)

## 2016-02-09 LAB — CBC WITH DIFFERENTIAL/PLATELET
BASO%: 1.3 % (ref 0.0–2.0)
Basophils Absolute: 0.1 10*3/uL (ref 0.0–0.1)
EOS%: 0.2 % (ref 0.0–7.0)
Eosinophils Absolute: 0 10*3/uL (ref 0.0–0.5)
HCT: 38.8 % (ref 34.8–46.6)
HGB: 12.8 g/dL (ref 11.6–15.9)
LYMPH%: 11.2 % — ABNORMAL LOW (ref 14.0–49.7)
MCH: 30.1 pg (ref 25.1–34.0)
MCHC: 33 g/dL (ref 31.5–36.0)
MCV: 91.3 fL (ref 79.5–101.0)
MONO#: 1 10*3/uL — ABNORMAL HIGH (ref 0.1–0.9)
MONO%: 9.1 % (ref 0.0–14.0)
NEUT#: 8.4 10*3/uL — ABNORMAL HIGH (ref 1.5–6.5)
NEUT%: 78.2 % — ABNORMAL HIGH (ref 38.4–76.8)
Platelets: 313 10*3/uL (ref 145–400)
RBC: 4.25 10*6/uL (ref 3.70–5.45)
RDW: 15.4 % — ABNORMAL HIGH (ref 11.2–14.5)
WBC: 10.8 10*3/uL — ABNORMAL HIGH (ref 3.9–10.3)
lymph#: 1.2 10*3/uL (ref 0.9–3.3)

## 2016-02-09 MED ORDER — HEPARIN SOD (PORK) LOCK FLUSH 100 UNIT/ML IV SOLN
500.0000 [IU] | Freq: Once | INTRAVENOUS | Status: AC | PRN
  Administered 2016-02-09: 500 [IU]
  Filled 2016-02-09: qty 5

## 2016-02-09 MED ORDER — SODIUM CHLORIDE 0.9 % IV SOLN
Freq: Once | INTRAVENOUS | Status: AC
  Administered 2016-02-09: 12:00:00 via INTRAVENOUS
  Filled 2016-02-09: qty 5

## 2016-02-09 MED ORDER — SODIUM CHLORIDE 0.9 % IV SOLN
Freq: Once | INTRAVENOUS | Status: AC
  Administered 2016-02-09: 11:00:00 via INTRAVENOUS

## 2016-02-09 MED ORDER — SODIUM CHLORIDE 0.9% FLUSH
10.0000 mL | INTRAVENOUS | Status: DC | PRN
  Administered 2016-02-09: 10 mL
  Filled 2016-02-09: qty 10

## 2016-02-09 MED ORDER — DOXORUBICIN HCL CHEMO IV INJECTION 2 MG/ML
60.0000 mg/m2 | Freq: Once | INTRAVENOUS | Status: AC
  Administered 2016-02-09: 96 mg via INTRAVENOUS
  Filled 2016-02-09: qty 48

## 2016-02-09 MED ORDER — SODIUM CHLORIDE 0.9 % IV SOLN
600.0000 mg/m2 | Freq: Once | INTRAVENOUS | Status: AC
  Administered 2016-02-09: 960 mg via INTRAVENOUS
  Filled 2016-02-09: qty 48

## 2016-02-09 MED ORDER — PEGFILGRASTIM 6 MG/0.6ML ~~LOC~~ PSKT
6.0000 mg | PREFILLED_SYRINGE | Freq: Once | SUBCUTANEOUS | Status: AC
  Administered 2016-02-09: 6 mg via SUBCUTANEOUS
  Filled 2016-02-09: qty 0.6

## 2016-02-09 MED ORDER — PALONOSETRON HCL INJECTION 0.25 MG/5ML
INTRAVENOUS | Status: AC
  Filled 2016-02-09: qty 5

## 2016-02-09 MED ORDER — PALONOSETRON HCL INJECTION 0.25 MG/5ML
0.2500 mg | Freq: Once | INTRAVENOUS | Status: AC
  Administered 2016-02-09: 0.25 mg via INTRAVENOUS

## 2016-02-09 NOTE — Patient Instructions (Addendum)
[place] [facility] Discharge Instructions for Patients Receiving Chemotherapy  Today you received the following chemotherapy agents doxorubicin and cytoxan.  To help prevent nausea and vomiting after your treatment, we encourage you to take your nausea medication as directed.  DO NOT TAKE ZOFRAN FOR 3 DAYS!!! If you develop nausea and vomiting that is not controlled by your nausea medication, call the clinic.   BELOW ARE SYMPTOMS THAT SHOULD BE REPORTED IMMEDIATELY:  *FEVER GREATER THAN 100.5 F  *CHILLS WITH OR WITHOUT FEVER  NAUSEA AND VOMITING THAT IS NOT CONTROLLED WITH YOUR NAUSEA MEDICATION  *UNUSUAL SHORTNESS OF BREATH  *UNUSUAL BRUISING OR BLEEDING  TENDERNESS IN MOUTH AND THROAT WITH OR WITHOUT PRESENCE OF ULCERS  *URINARY PROBLEMS  *BOWEL PROBLEMS  UNUSUAL RASH Items with * indicate a potential emergency and should be followed up as soon as possible.  Feel free to call the clinic you have any questions or concerns. The clinic phone number is [phone].  Please show the [place] at check-in to the Emergency Department and triage nurse.

## 2016-02-10 ENCOUNTER — Telehealth: Payer: Self-pay | Admitting: Hematology and Oncology

## 2016-02-10 NOTE — Telephone Encounter (Signed)
Spoke with patient to confirm upcoming appt on 4/26

## 2016-02-21 ENCOUNTER — Ambulatory Visit: Payer: BLUE CROSS/BLUE SHIELD | Admitting: Hematology and Oncology

## 2016-02-23 ENCOUNTER — Other Ambulatory Visit: Payer: BLUE CROSS/BLUE SHIELD

## 2016-02-23 ENCOUNTER — Ambulatory Visit (HOSPITAL_BASED_OUTPATIENT_CLINIC_OR_DEPARTMENT_OTHER): Payer: BLUE CROSS/BLUE SHIELD | Admitting: Hematology and Oncology

## 2016-02-23 ENCOUNTER — Encounter: Payer: Self-pay | Admitting: Hematology and Oncology

## 2016-02-23 ENCOUNTER — Other Ambulatory Visit: Payer: Self-pay | Admitting: Hematology and Oncology

## 2016-02-23 ENCOUNTER — Telehealth: Payer: Self-pay | Admitting: Hematology and Oncology

## 2016-02-23 ENCOUNTER — Other Ambulatory Visit (HOSPITAL_BASED_OUTPATIENT_CLINIC_OR_DEPARTMENT_OTHER): Payer: BLUE CROSS/BLUE SHIELD

## 2016-02-23 ENCOUNTER — Ambulatory Visit: Payer: BLUE CROSS/BLUE SHIELD

## 2016-02-23 VITALS — BP 111/64 | HR 98 | Temp 98.8°F | Resp 18 | Wt 133.7 lb

## 2016-02-23 DIAGNOSIS — C50412 Malignant neoplasm of upper-outer quadrant of left female breast: Secondary | ICD-10-CM

## 2016-02-23 DIAGNOSIS — D709 Neutropenia, unspecified: Secondary | ICD-10-CM | POA: Diagnosis not present

## 2016-02-23 DIAGNOSIS — T451X5A Adverse effect of antineoplastic and immunosuppressive drugs, initial encounter: Secondary | ICD-10-CM

## 2016-02-23 DIAGNOSIS — D701 Agranulocytosis secondary to cancer chemotherapy: Secondary | ICD-10-CM

## 2016-02-23 LAB — CBC WITH DIFFERENTIAL/PLATELET
BASO%: 3.9 % — ABNORMAL HIGH (ref 0.0–2.0)
Basophils Absolute: 0.1 10*3/uL (ref 0.0–0.1)
EOS%: 0.4 % (ref 0.0–7.0)
Eosinophils Absolute: 0 10*3/uL (ref 0.0–0.5)
HCT: 33.5 % — ABNORMAL LOW (ref 34.8–46.6)
HGB: 11.3 g/dL — ABNORMAL LOW (ref 11.6–15.9)
LYMPH%: 32 % (ref 14.0–49.7)
MCH: 30.8 pg (ref 25.1–34.0)
MCHC: 33.7 g/dL (ref 31.5–36.0)
MCV: 91.3 fL (ref 79.5–101.0)
MONO#: 0.6 10*3/uL (ref 0.1–0.9)
MONO%: 26.3 % — ABNORMAL HIGH (ref 0.0–14.0)
NEUT#: 0.9 10*3/uL — ABNORMAL LOW (ref 1.5–6.5)
NEUT%: 37.4 % — ABNORMAL LOW (ref 38.4–76.8)
Platelets: 351 10*3/uL (ref 145–400)
RBC: 3.67 10*6/uL — ABNORMAL LOW (ref 3.70–5.45)
RDW: 16.2 % — ABNORMAL HIGH (ref 11.2–14.5)
WBC: 2.3 10*3/uL — ABNORMAL LOW (ref 3.9–10.3)
lymph#: 0.7 10*3/uL — ABNORMAL LOW (ref 0.9–3.3)

## 2016-02-23 LAB — COMPREHENSIVE METABOLIC PANEL
ALT: 30 U/L (ref 0–55)
AST: 31 U/L (ref 5–34)
Albumin: 3.4 g/dL — ABNORMAL LOW (ref 3.5–5.0)
Alkaline Phosphatase: 88 U/L (ref 40–150)
Anion Gap: 10 mEq/L (ref 3–11)
BUN: 4.8 mg/dL — ABNORMAL LOW (ref 7.0–26.0)
CO2: 26 mEq/L (ref 22–29)
Calcium: 9.7 mg/dL (ref 8.4–10.4)
Chloride: 104 mEq/L (ref 98–109)
Creatinine: 0.8 mg/dL (ref 0.6–1.1)
EGFR: 85 mL/min/{1.73_m2} — ABNORMAL LOW (ref >90)
Glucose: 89 mg/dl (ref 70–140)
Potassium: 3.9 mEq/L (ref 3.5–5.1)
Sodium: 139 mEq/L (ref 136–145)
Total Bilirubin: <0.3 mg/dL (ref 0.20–1.20)
Total Protein: 7 g/dL (ref 6.4–8.3)

## 2016-02-23 MED ORDER — PROCHLORPERAZINE MALEATE 10 MG PO TABS: 10.0000 mg | ORAL_TABLET | Freq: Four times a day (QID) | ORAL | Status: DC | PRN

## 2016-02-23 MED ORDER — LORAZEPAM 0.5 MG PO TABS: 0.5000 mg | ORAL_TABLET | Freq: Four times a day (QID) | ORAL | Status: DC | PRN

## 2016-02-23 MED ORDER — LIDOCAINE-PRILOCAINE 2.5-2.5 % EX CREA: TOPICAL_CREAM | CUTANEOUS | Status: DC

## 2016-02-23 MED ORDER — ONDANSETRON HCL 8 MG PO TABS: 8.0000 mg | ORAL_TABLET | Freq: Two times a day (BID) | ORAL | Status: DC | PRN

## 2016-02-23 NOTE — Progress Notes (Signed)
Unable to get in to exam room prior to MD.  No assessment performed.  

## 2016-02-23 NOTE — Assessment & Plan Note (Signed)
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 Systemic chemotherapy with dose dense Adriamycin and Cytoxan 4 followed by Abraxane weekly 12 2. Adjuvant radiation therapy followed by 3. Adjuvant antiestrogen therapy -------------------------------------------------------------------------------------------------------------------------- Current Treatment: Completed 4 cycles of Adriamycin and Cytoxan today is Cycle 1/12 Abraxane ECHO: 12/23/15: EF 55-60% Chemotherapy toxicities: 1. Bone discomfort from Neulasta 2. Nausea grade 1 3. Fatigue grade 1  Monitoring closely for toxicities  RTC in 2 weeks for cycle 3

## 2016-02-23 NOTE — Progress Notes (Signed)
Patient Care Team: Pcp Not In System as PCP - General  SUMMARY OF ONCOLOGIC HISTORY:   Breast cancer of upper-outer quadrant of left female breast ([place])   10/19/2015 Mammogram Screening mammogram revealed left breast asymmetry 1.1 x 0.8 x 1 cm, T1 cN0 stage IA clinical stage   11/03/2015 Initial Diagnosis Left breast biopsy 11:30 position: Invasive ductal carcinoma with DCIS, grade 2, ER 100%, PR 30%, Ki-67 90%, HER-2 negative ratio 1.44   11/22/2015 Surgery Lt Lumpectomy: IDC 1.5 cm, 0/2 LN, grade 2, with Int Grade DCIS, ER 100%, PR 30%, Her 2 Neg Ratio 1.91, T1cN0 (stage 1A)Oncotype DX score 37, 10 year ROR 25%   12/29/2015 -  Chemotherapy Adjuvant chemotherapy with dose dense Adriamycin Cytoxan 4 followed by Abraxane weekly 12    CHIEF COMPLIANT: Complains of more fatigue and nausea and vomiting  INTERVAL HISTORY: Heather [name] is a 60 year old with above-mentioned breast cancer currently on adjuvant chemotherapy and today is supposed to be cycle 1 of Abraxane but her [place] today is low and hence cannot get chemotherapy. She reported that after the last cycle she had lot more fatigue accompanied by nausea and vomiting. She is doing much better now.  REVIEW OF SYSTEMS:   Constitutional: Denies fevers, chills or abnormal weight loss, complains of fatigue Eyes: Denies blurriness of vision Ears, nose, mouth, throat, and face: Denies mucositis or sore throat Respiratory: Denies cough, dyspnea or wheezes Cardiovascular: Denies palpitation, chest discomfort Gastrointestinal:  Previously nausea and vomiting due to chemotherapy Skin: Denies abnormal skin rashes Lymphatics: Denies new lymphadenopathy or easy bruising Neurological:Denies numbness, tingling or new weaknesses Behavioral/Psych: Mood is stable, no new changes  Extremities: No lower extremity edema Breast:  denies any pain or lumps or nodules in either breasts All other systems were reviewed with the patient and are negative.  I  have reviewed the past medical history, past surgical history, social history and family history with the patient and they are unchanged from previous note.  ALLERGIES:  has No Known Allergies.  MEDICATIONS:  Current Outpatient Prescriptions  Medication Sig Dispense Refill  . Ascorbic Acid (VITAMIN C) 1000 MG tablet Take 1,000 mg by mouth daily.    . calcium carbonate (OS-CAL) 600 MG TABS Take 600 mg by mouth 2 (two) times daily with a meal.    . cholecalciferol (VITAMIN D) 1000 UNITS tablet Take 1,000 Units by mouth daily.    Marland Kitchen LORazepam (ATIVAN) 1 MG tablet Take 1 tablet (1 mg total) by mouth every 8 (eight) hours as needed for anxiety (or nausea). 30 tablet 0  . oxyCODONE-acetaminophen (ROXICET) 5-325 MG tablet Take 1-2 tablets by mouth every 4 (four) hours as needed. 50 tablet 0  . VITAMIN E COMPLEX PO Take by mouth.     No current facility-administered medications for this visit.    PHYSICAL EXAMINATION: ECOG PERFORMANCE STATUS: 1 - Symptomatic but completely ambulatory  There were no vitals filed for this visit. There were no vitals filed for this visit.  GENERAL:alert, no distress and comfortable SKIN: skin color, texture, turgor are normal, no rashes or significant lesions EYES: normal, Conjunctiva are pink and non-injected, sclera clear OROPHARYNX:no exudate, no erythema and lips, buccal mucosa, and tongue normal  NECK: supple, thyroid normal size, non-tender, without nodularity LYMPH:  no palpable lymphadenopathy in the cervical, axillary or inguinal LUNGS: clear to auscultation and percussion with normal breathing effort HEART: regular rate & rhythm and no murmurs and no lower extremity edema ABDOMEN:abdomen soft, non-tender and normal  bowel sounds MUSCULOSKELETAL:no cyanosis of digits and no clubbing  NEURO: alert & oriented x 3 with fluent speech, no focal motor/sensory deficits EXTREMITIES: No lower extremity edema  LABORATORY DATA:  I have reviewed the data as  listed   Chemistry      Component Value Date/Time   NA 140 02/09/2016 1017   K 4.4 02/09/2016 1017   CO2 28 02/09/2016 1017   BUN 6.1* 02/09/2016 1017   CREATININE 0.8 02/09/2016 1017      Component Value Date/Time   CALCIUM 9.9 02/09/2016 1017   ALKPHOS 102 02/09/2016 1017   AST 29 02/09/2016 1017   ALT 32 02/09/2016 1017   BILITOT <0.30 02/09/2016 1017       Lab Results  Component Value Date   WBC 10.8* 02/09/2016   HGB 12.8 02/09/2016   HCT 38.8 02/09/2016   MCV 91.3 02/09/2016   PLT 313 02/09/2016   NEUTROABS 8.4* 02/09/2016     ASSESSMENT & PLAN:  Breast cancer of upper-outer quadrant of left female breast ([place]) Lt Lumpectomy 11/22/15: IDC 1.5 cm, 0/2 LN, grade 2, with Int Grade DCIS, ER 100%, PR 30%, Her 2 Neg Ratio 1.91, T1cN0 (stage 1A), Oncotype DX recurrence score 37, 25% 10 year risk of recurrence  Recommendation:  1. Systemic chemotherapy with dose dense Adriamycin and Cytoxan 4 followed by Abraxane weekly 12 2. Adjuvant radiation therapy followed by 3. Adjuvant antiestrogen therapy -------------------------------------------------------------------------------------------------------------------------- Current Treatment: Completed 4 cycles of Adriamycin and Cytoxan today is Cycle 1/12 Abraxane ECHO: 12/23/15: EF 55-60% Chemotherapy toxicities: 1. Bone discomfort from Neulasta 2. Alopecia 3. Nausea grade 1-2 4. Fatigue grade 1 5. Neutropenia: Unclear if it is related to dysfunction of the Neulasta onpro injection. Delaying chemotherapy today. She will receive first cycle of Abraxane next week  Monitoring closely for toxicities   No orders of the defined types were placed in this encounter.   The patient has a good understanding of the overall plan. she agrees with it. she will call with any problems that may develop before the next visit here.   Rulon Eisenmenger, MD 02/23/2016

## 2016-02-23 NOTE — Telephone Encounter (Signed)
Added ov appt to 5/3 previous sch per 4/26 pof

## 2016-03-01 ENCOUNTER — Ambulatory Visit (HOSPITAL_BASED_OUTPATIENT_CLINIC_OR_DEPARTMENT_OTHER): Payer: BLUE CROSS/BLUE SHIELD

## 2016-03-01 ENCOUNTER — Telehealth: Payer: Self-pay | Admitting: Hematology and Oncology

## 2016-03-01 ENCOUNTER — Ambulatory Visit (HOSPITAL_BASED_OUTPATIENT_CLINIC_OR_DEPARTMENT_OTHER): Payer: BLUE CROSS/BLUE SHIELD | Admitting: Hematology and Oncology

## 2016-03-01 ENCOUNTER — Other Ambulatory Visit (HOSPITAL_BASED_OUTPATIENT_CLINIC_OR_DEPARTMENT_OTHER): Payer: BLUE CROSS/BLUE SHIELD

## 2016-03-01 ENCOUNTER — Encounter: Payer: Self-pay | Admitting: Hematology and Oncology

## 2016-03-01 VITALS — BP 105/62 | HR 101 | Temp 98.9°F | Resp 18 | Ht 59.0 in | Wt 131.3 lb

## 2016-03-01 DIAGNOSIS — C50412 Malignant neoplasm of upper-outer quadrant of left female breast: Secondary | ICD-10-CM | POA: Diagnosis not present

## 2016-03-01 DIAGNOSIS — Z5112 Encounter for antineoplastic immunotherapy: Secondary | ICD-10-CM

## 2016-03-01 LAB — CBC WITH DIFFERENTIAL/PLATELET
BASO%: 1.4 % (ref 0.0–2.0)
Basophils Absolute: 0.1 10*3/uL (ref 0.0–0.1)
EOS%: 0.7 % (ref 0.0–7.0)
Eosinophils Absolute: 0.1 10*3/uL (ref 0.0–0.5)
HCT: 35.3 % (ref 34.8–46.6)
HGB: 11.8 g/dL (ref 11.6–15.9)
LYMPH%: 12.4 % — ABNORMAL LOW (ref 14.0–49.7)
MCH: 30 pg (ref 25.1–34.0)
MCHC: 33.4 g/dL (ref 31.5–36.0)
MCV: 89.8 fL (ref 79.5–101.0)
MONO#: 1.6 10*3/uL — ABNORMAL HIGH (ref 0.1–0.9)
MONO%: 17.6 % — ABNORMAL HIGH (ref 0.0–14.0)
NEUT#: 6 10*3/uL (ref 1.5–6.5)
NEUT%: 67.9 % (ref 38.4–76.8)
Platelets: 616 10*3/uL — ABNORMAL HIGH (ref 145–400)
RBC: 3.93 10*6/uL (ref 3.70–5.45)
RDW: 16.1 % — ABNORMAL HIGH (ref 11.2–14.5)
WBC: 8.9 10*3/uL (ref 3.9–10.3)
lymph#: 1.1 10*3/uL (ref 0.9–3.3)

## 2016-03-01 LAB — COMPREHENSIVE METABOLIC PANEL
ALT: 21 U/L (ref 0–55)
AST: 27 U/L (ref 5–34)
Albumin: 3.3 g/dL — ABNORMAL LOW (ref 3.5–5.0)
Alkaline Phosphatase: 77 U/L (ref 40–150)
Anion Gap: 9 mEq/L (ref 3–11)
BUN: 5.9 mg/dL — ABNORMAL LOW (ref 7.0–26.0)
CO2: 26 mEq/L (ref 22–29)
Calcium: 9.5 mg/dL (ref 8.4–10.4)
Chloride: 101 mEq/L (ref 98–109)
Creatinine: 0.8 mg/dL (ref 0.6–1.1)
EGFR: 87 mL/min/{1.73_m2} — ABNORMAL LOW (ref >90)
Glucose: 95 mg/dl (ref 70–140)
Potassium: 3.9 mEq/L (ref 3.5–5.1)
Sodium: 136 mEq/L (ref 136–145)
Total Bilirubin: 0.34 mg/dL (ref 0.20–1.20)
Total Protein: 7.2 g/dL (ref 6.4–8.3)

## 2016-03-01 MED ORDER — SODIUM CHLORIDE 0.9 % IV SOLN
Freq: Once | INTRAVENOUS | Status: AC
  Administered 2016-03-01: 11:00:00 via INTRAVENOUS

## 2016-03-01 MED ORDER — HEPARIN SOD (PORK) LOCK FLUSH 100 UNIT/ML IV SOLN
500.0000 [IU] | Freq: Once | INTRAVENOUS | Status: AC | PRN
  Administered 2016-03-01: 500 [IU]
  Filled 2016-03-01: qty 5

## 2016-03-01 MED ORDER — PACLITAXEL PROTEIN-BOUND CHEMO INJECTION 100 MG
80.0000 mg/m2 | Freq: Once | INTRAVENOUS | Status: AC
  Administered 2016-03-01: 125 mg via INTRAVENOUS
  Filled 2016-03-01: qty 25

## 2016-03-01 MED ORDER — SODIUM CHLORIDE 0.9% FLUSH
10.0000 mL | INTRAVENOUS | Status: DC | PRN
  Administered 2016-03-01: 10 mL
  Filled 2016-03-01: qty 10

## 2016-03-01 MED ORDER — PROCHLORPERAZINE MALEATE 10 MG PO TABS
ORAL_TABLET | ORAL | Status: AC
  Filled 2016-03-01: qty 1

## 2016-03-01 MED ORDER — PROCHLORPERAZINE MALEATE 10 MG PO TABS
10.0000 mg | ORAL_TABLET | Freq: Once | ORAL | Status: AC
  Administered 2016-03-01: 10 mg via ORAL

## 2016-03-01 NOTE — Assessment & Plan Note (Signed)
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 Systemic chemotherapy with dose dense Adriamycin and Cytoxan 4 followed by Abraxane weekly 12 2. Adjuvant radiation therapy followed by 3. Adjuvant antiestrogen therapy -------------------------------------------------------------------------------------------------------------------------- Current Treatment: Completed 4 cycles of Adriamycin and Cytoxan today is Cycle 1/12 Abraxane ECHO: 12/23/15: EF 55-60% Chemotherapy toxicities: 1. Bone discomfort from Neulasta 2. Alopecia 3. Nausea grade 1-2 4. Fatigue grade 1 5. Neutropenia: Unclear if it is related to dysfunction of the Neulasta onpro injection. Delaying chemotherapy.   Monitoring closely for toxicities Return to clinic in 2 weeks for cycle 3

## 2016-03-01 NOTE — Telephone Encounter (Signed)
appt made and avs printed °

## 2016-03-01 NOTE — Patient Instructions (Signed)

## 2016-03-01 NOTE — Progress Notes (Signed)
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 Last week of treatment was held for neutropenia with an [place] of 900. Today her neutrophil count has normalized. She has recently noted increasing fatigue as well as low-grade temperatures along with nausea and sore throat. She had restarted the symptoms to allergies. She has not been taking any allergy medication.denies any productive expectoration or fevers or chills and her more than 100.  REVIEW OF SYSTEMS:   Constitutional: Denies fevers, chills or abnormal weight loss Eyes: Denies blurriness of vision Ears, nose, mouth, throat, and face: slight sore throat Respiratory: Denies cough, dyspnea or wheezes Cardiovascular: Denies palpitation, chest discomfort Gastrointestinal:  Denies nausea, heartburn or change in bowel habits Skin: Denies abnormal skin rashes Lymphatics: Denies new lymphadenopathy or easy bruising Neurological:Denies numbness, tingling or new weaknesses Behavioral/Psych: Mood is stable, no new changes  Extremities: No lower extremity  edema Breast:  denies any pain or lumps or nodules in either breasts All other systems were reviewed with the patient and are negative.  I have reviewed the past medical history, past surgical history, social history and family history with the patient and they are unchanged from previous note.  ALLERGIES:  has No Known Allergies.  MEDICATIONS:  Current Outpatient Prescriptions  Medication Sig Dispense Refill  . Ascorbic Acid (VITAMIN C) 1000 MG tablet Take 1,000 mg by mouth daily.    . calcium carbonate (OS-CAL) 600 MG TABS Take 600 mg by mouth 2 (two) times daily with a meal.    . cholecalciferol (VITAMIN D) 1000 UNITS tablet Take 1,000 Units by mouth daily.    Marland Kitchen lidocaine-prilocaine (EMLA) cream Apply to affected area once 30 g 3  . LORazepam (ATIVAN) 0.5 MG tablet Take 1 tablet (0.5 mg total) by mouth every 6 (six) hours as needed (Nausea or vomiting). 30 tablet 0  . LORazepam (ATIVAN) 1 MG tablet Take 1 tablet (1 mg total) by mouth every 8 (eight) hours as needed for anxiety (or nausea). 30 tablet 0  . ondansetron (ZOFRAN) 8 MG tablet Take 1 tablet (8 mg total) by mouth 2 (two) times daily as needed (Nausea or vomiting). 30 tablet 1  . oxyCODONE-acetaminophen (ROXICET) 5-325 MG tablet Take 1-2 tablets by mouth every 4 (four) hours as needed. 50 tablet 0  . prochlorperazine (COMPAZINE) 10 MG tablet Take 1 tablet (10 mg total) by mouth every 6 (six) hours as needed (Nausea or vomiting). 30 tablet 1  . VITAMIN E COMPLEX PO Take by mouth.     No current facility-administered medications for this visit.   Facility-Administered Medications  Ordered in Other Visits  Medication Dose Route Frequency Provider Last Rate Last Dose  . heparin lock flush 100 unit/mL  500 Units Intracatheter Once PRN Nicholas Lose, MD      . PACLitaxel-protein bound (ABRAXANE) chemo infusion 125 mg  80 mg/m2 (Treatment Plan Actual) Intravenous Once Nicholas Lose, MD      . sodium chloride flush (NS) 0.9 % injection 10  mL  10 mL Intracatheter PRN Nicholas Lose, MD        PHYSICAL EXAMINATION: ECOG PERFORMANCE STATUS: 1 - Symptomatic but completely ambulatory  Filed Vitals:   03/01/16 1031  BP: 105/62  Pulse: 101  Temp: 98.9 F (37.2 C)  Resp: 18   Filed Weights   03/01/16 1031  Weight: 131 lb 4.8 oz (59.557 kg)    GENERAL:alert, no distress and comfortable SKIN: skin color, texture, turgor are normal, no rashes or significant lesions EYES: normal, Conjunctiva are pink and non-injected, sclera clear OROPHARYNX:no exudate, no erythema and lips, buccal mucosa, and tongue normal  NECK: supple, thyroid normal size, non-tender, without nodularity LYMPH:  no palpable lymphadenopathy in the cervical, axillary or inguinal LUNGS: clear to auscultation and percussion with normal breathing effort HEART: regular rate & rhythm and no murmurs and no lower extremity edema ABDOMEN:abdomen soft, non-tender and normal bowel sounds MUSCULOSKELETAL:no cyanosis of digits and no clubbing  NEURO: alert & oriented x 3 with fluent speech, no focal motor/sensory deficits EXTREMITIES: No lower extremity edema  LABORATORY DATA:  I have reviewed the data as listed   Chemistry      Component Value Date/Time   NA 136 03/01/2016 1009   K 3.9 03/01/2016 1009   CO2 26 03/01/2016 1009   BUN 5.9* 03/01/2016 1009   CREATININE 0.8 03/01/2016 1009      Component Value Date/Time   CALCIUM 9.5 03/01/2016 1009   ALKPHOS 77 03/01/2016 1009   AST 27 03/01/2016 1009   ALT 21 03/01/2016 1009   BILITOT 0.34 03/01/2016 1009       Lab Results  Component Value Date   WBC 8.9 03/01/2016   HGB 11.8 03/01/2016   HCT 35.3 03/01/2016   MCV 89.8 03/01/2016   PLT 616* 03/01/2016   NEUTROABS 6.0 03/01/2016   ASSESSMENT & PLAN:  Breast cancer of upper-outer quadrant of left female breast ([place]) Lt Lumpectomy 11/22/15: IDC 1.5 cm, 0/2 LN, grade 2, with Int Grade DCIS, ER 100%, PR 30%, Her 2 Neg Ratio 1.91, T1cN0 (stage 1A),  Oncotype DX recurrence score 37, 25% 10 year risk of recurrence  Recommendation:  1. Systemic chemotherapy with dose dense Adriamycin and Cytoxan 4 followed by Abraxane weekly 12 2. Adjuvant radiation therapy followed by 3. Adjuvant antiestrogen therapy -------------------------------------------------------------------------------------------------------------------------- Current Treatment: Completed 4 cycles of Adriamycin and Cytoxan today is Cycle 1/12 Abraxane ECHO: 12/23/15: EF 55-60% Chemotherapy toxicities: 1. Bone discomfort from Neulasta 2. Alopecia 3. Nausea grade 1-2 4. Fatigue grade 1 5. Neutropenia: Unclear if it is related to dysfunction of the Neulasta onpro injection. Delaying chemotherapy.   Monitoring closely for toxicities Return to clinic in 2 weeks for cycle 3   No orders of the defined types were placed in this encounter.   The patient has a good understanding of the overall plan. she agrees with it. she will call with any problems that may develop before the next visit here.   Rulon Eisenmenger, MD 03/01/2016

## 2016-03-02 ENCOUNTER — Telehealth: Payer: Self-pay

## 2016-03-02 NOTE — Telephone Encounter (Signed)
Called and spoke to pt regarding 1st Abraxane treatment yesterday.  Pt states she is doing well and is without complaints.  Pt states she has had 2 episodes of diarrhea starting today.  I instructed pt to monitor closely and drink plenty of fluids.  We also discussed taking an OTC anti-diarrheal should the diarrhea continue and to contact us if it worsens.  Pt also informed to call us with any other new questions, concerns, or symptoms.  No further questions at time of call.

## 2016-03-08 ENCOUNTER — Other Ambulatory Visit (HOSPITAL_BASED_OUTPATIENT_CLINIC_OR_DEPARTMENT_OTHER): Payer: BLUE CROSS/BLUE SHIELD

## 2016-03-08 ENCOUNTER — Ambulatory Visit: Payer: BLUE CROSS/BLUE SHIELD | Admitting: Hematology and Oncology

## 2016-03-08 ENCOUNTER — Ambulatory Visit (HOSPITAL_BASED_OUTPATIENT_CLINIC_OR_DEPARTMENT_OTHER): Payer: BLUE CROSS/BLUE SHIELD

## 2016-03-08 ENCOUNTER — Ambulatory Visit: Payer: BLUE CROSS/BLUE SHIELD

## 2016-03-08 VITALS — BP 106/63 | HR 88 | Temp 98.0°F | Resp 18

## 2016-03-08 DIAGNOSIS — C50412 Malignant neoplasm of upper-outer quadrant of left female breast: Secondary | ICD-10-CM

## 2016-03-08 DIAGNOSIS — Z5111 Encounter for antineoplastic chemotherapy: Secondary | ICD-10-CM | POA: Diagnosis not present

## 2016-03-08 LAB — COMPREHENSIVE METABOLIC PANEL
ALT: 23 U/L (ref 0–55)
AST: 32 U/L (ref 5–34)
Albumin: 3.3 g/dL — ABNORMAL LOW (ref 3.5–5.0)
Alkaline Phosphatase: 67 U/L (ref 40–150)
Anion Gap: 9 mEq/L (ref 3–11)
BUN: 5.4 mg/dL — ABNORMAL LOW (ref 7.0–26.0)
CO2: 27 mEq/L (ref 22–29)
Calcium: 9.8 mg/dL (ref 8.4–10.4)
Chloride: 103 mEq/L (ref 98–109)
Creatinine: 0.7 mg/dL (ref 0.6–1.1)
EGFR: 89 mL/min/{1.73_m2} — ABNORMAL LOW (ref >90)
Glucose: 79 mg/dl (ref 70–140)
Potassium: 4.1 mEq/L (ref 3.5–5.1)
Sodium: 139 mEq/L (ref 136–145)
Total Bilirubin: 0.39 mg/dL (ref 0.20–1.20)
Total Protein: 7.2 g/dL (ref 6.4–8.3)

## 2016-03-08 LAB — CBC WITH DIFFERENTIAL/PLATELET
BASO%: 1.7 % (ref 0.0–2.0)
Basophils Absolute: 0.1 10*3/uL (ref 0.0–0.1)
EOS%: 0.9 % (ref 0.0–7.0)
Eosinophils Absolute: 0.1 10*3/uL (ref 0.0–0.5)
HCT: 34.9 % (ref 34.8–46.6)
HGB: 11.5 g/dL — ABNORMAL LOW (ref 11.6–15.9)
LYMPH%: 22 % (ref 14.0–49.7)
MCH: 29.7 pg (ref 25.1–34.0)
MCHC: 33 g/dL (ref 31.5–36.0)
MCV: 90.2 fL (ref 79.5–101.0)
MONO#: 0.7 10*3/uL (ref 0.1–0.9)
MONO%: 12.8 % (ref 0.0–14.0)
NEUT#: 3.6 10*3/uL (ref 1.5–6.5)
NEUT%: 62.6 % (ref 38.4–76.8)
Platelets: 452 10*3/uL — ABNORMAL HIGH (ref 145–400)
RBC: 3.87 10*6/uL (ref 3.70–5.45)
RDW: 16.1 % — ABNORMAL HIGH (ref 11.2–14.5)
WBC: 5.8 10*3/uL (ref 3.9–10.3)
lymph#: 1.3 10*3/uL (ref 0.9–3.3)

## 2016-03-08 MED ORDER — HEPARIN SOD (PORK) LOCK FLUSH 100 UNIT/ML IV SOLN
500.0000 [IU] | Freq: Once | INTRAVENOUS | Status: AC | PRN
  Administered 2016-03-08: 500 [IU]
  Filled 2016-03-08: qty 5

## 2016-03-08 MED ORDER — PROCHLORPERAZINE MALEATE 10 MG PO TABS
ORAL_TABLET | ORAL | Status: AC
  Filled 2016-03-08: qty 1

## 2016-03-08 MED ORDER — SODIUM CHLORIDE 0.9% FLUSH
10.0000 mL | INTRAVENOUS | Status: DC | PRN
  Administered 2016-03-08: 10 mL
  Filled 2016-03-08: qty 10

## 2016-03-08 MED ORDER — PROCHLORPERAZINE MALEATE 10 MG PO TABS
10.0000 mg | ORAL_TABLET | Freq: Once | ORAL | Status: AC
  Administered 2016-03-08: 10 mg via ORAL

## 2016-03-08 MED ORDER — SODIUM CHLORIDE 0.9 % IV SOLN
Freq: Once | INTRAVENOUS | Status: AC
  Administered 2016-03-08: 09:00:00 via INTRAVENOUS

## 2016-03-08 MED ORDER — PACLITAXEL PROTEIN-BOUND CHEMO INJECTION 100 MG
80.0000 mg/m2 | Freq: Once | INTRAVENOUS | Status: AC
  Administered 2016-03-08: 125 mg via INTRAVENOUS
  Filled 2016-03-08: qty 25

## 2016-03-08 NOTE — Patient Instructions (Signed)
[facility] Discharge Instructions for Patients Receiving Chemotherapy  Today you received the following chemotherapy agents: Abraxane   To help prevent nausea and vomiting after your treatment, we encourage you to take your nausea medication as directed.    If you develop nausea and vomiting that is not controlled by your nausea medication, call the clinic.   BELOW ARE SYMPTOMS THAT SHOULD BE REPORTED IMMEDIATELY:  *FEVER GREATER THAN 100.5 F  *CHILLS WITH OR WITHOUT FEVER  NAUSEA AND VOMITING THAT IS NOT CONTROLLED WITH YOUR NAUSEA MEDICATION  *UNUSUAL SHORTNESS OF BREATH  *UNUSUAL BRUISING OR BLEEDING  TENDERNESS IN MOUTH AND THROAT WITH OR WITHOUT PRESENCE OF ULCERS  *URINARY PROBLEMS  *BOWEL PROBLEMS  UNUSUAL RASH Items with * indicate a potential emergency and should be followed up as soon as possible.  Feel free to call the clinic you have any questions or concerns. The clinic phone number is [phone].  Please show the CHEMO ALERT CARD at check-in to the Emergency Department and triage nurse.   

## 2016-03-15 ENCOUNTER — Ambulatory Visit (HOSPITAL_BASED_OUTPATIENT_CLINIC_OR_DEPARTMENT_OTHER): Payer: BLUE CROSS/BLUE SHIELD | Admitting: Hematology and Oncology

## 2016-03-15 ENCOUNTER — Other Ambulatory Visit (HOSPITAL_BASED_OUTPATIENT_CLINIC_OR_DEPARTMENT_OTHER): Payer: BLUE CROSS/BLUE SHIELD

## 2016-03-15 ENCOUNTER — Telehealth: Payer: Self-pay | Admitting: Hematology and Oncology

## 2016-03-15 ENCOUNTER — Ambulatory Visit (HOSPITAL_BASED_OUTPATIENT_CLINIC_OR_DEPARTMENT_OTHER): Payer: BLUE CROSS/BLUE SHIELD

## 2016-03-15 ENCOUNTER — Encounter: Payer: Self-pay | Admitting: Hematology and Oncology

## 2016-03-15 VITALS — BP 120/81 | HR 83 | Temp 98.6°F | Resp 18 | Wt 131.7 lb

## 2016-03-15 DIAGNOSIS — Z5111 Encounter for antineoplastic chemotherapy: Secondary | ICD-10-CM | POA: Diagnosis not present

## 2016-03-15 DIAGNOSIS — C50412 Malignant neoplasm of upper-outer quadrant of left female breast: Secondary | ICD-10-CM

## 2016-03-15 LAB — CBC WITH DIFFERENTIAL/PLATELET
BASO%: 4.3 % — ABNORMAL HIGH (ref 0.0–2.0)
Basophils Absolute: 0.2 10*3/uL — ABNORMAL HIGH (ref 0.0–0.1)
EOS%: 4.9 % (ref 0.0–7.0)
Eosinophils Absolute: 0.2 10*3/uL (ref 0.0–0.5)
HCT: 36.2 % (ref 34.8–46.6)
HGB: 11.7 g/dL (ref 11.6–15.9)
LYMPH%: 29.1 % (ref 14.0–49.7)
MCH: 30 pg (ref 25.1–34.0)
MCHC: 32.3 g/dL (ref 31.5–36.0)
MCV: 92.8 fL (ref 79.5–101.0)
MONO#: 0.2 10*3/uL (ref 0.1–0.9)
MONO%: 6.2 % (ref 0.0–14.0)
NEUT#: 2.1 10*3/uL (ref 1.5–6.5)
NEUT%: 55.5 % (ref 38.4–76.8)
Platelets: 361 10*3/uL (ref 145–400)
RBC: 3.9 10*6/uL (ref 3.70–5.45)
RDW: 17.6 % — ABNORMAL HIGH (ref 11.2–14.5)
WBC: 3.8 10*3/uL — ABNORMAL LOW (ref 3.9–10.3)
lymph#: 1.1 10*3/uL (ref 0.9–3.3)

## 2016-03-15 LAB — COMPREHENSIVE METABOLIC PANEL
ALT: 28 U/L (ref 0–55)
AST: 36 U/L — ABNORMAL HIGH (ref 5–34)
Albumin: 3.4 g/dL — ABNORMAL LOW (ref 3.5–5.0)
Alkaline Phosphatase: 68 U/L (ref 40–150)
Anion Gap: 6 mEq/L (ref 3–11)
BUN: 5.6 mg/dL — ABNORMAL LOW (ref 7.0–26.0)
CO2: 28 mEq/L (ref 22–29)
Calcium: 9.7 mg/dL (ref 8.4–10.4)
Chloride: 106 mEq/L (ref 98–109)
Creatinine: 0.7 mg/dL (ref 0.6–1.1)
EGFR: >90 mL/min/{1.73_m2} (ref >90)
Glucose: 90 mg/dl (ref 70–140)
Potassium: 3.8 mEq/L (ref 3.5–5.1)
Sodium: 140 mEq/L (ref 136–145)
Total Bilirubin: <0.3 mg/dL (ref 0.20–1.20)
Total Protein: 7 g/dL (ref 6.4–8.3)

## 2016-03-15 MED ORDER — PACLITAXEL PROTEIN-BOUND CHEMO INJECTION 100 MG
80.0000 mg/m2 | Freq: Once | INTRAVENOUS | Status: AC
  Administered 2016-03-15: 125 mg via INTRAVENOUS
  Filled 2016-03-15: qty 25

## 2016-03-15 MED ORDER — SODIUM CHLORIDE 0.9 % IV SOLN
Freq: Once | INTRAVENOUS | Status: AC
  Administered 2016-03-15: 10:00:00 via INTRAVENOUS

## 2016-03-15 MED ORDER — PROCHLORPERAZINE MALEATE 10 MG PO TABS
ORAL_TABLET | ORAL | Status: AC
  Filled 2016-03-15: qty 1

## 2016-03-15 MED ORDER — HEPARIN SOD (PORK) LOCK FLUSH 100 UNIT/ML IV SOLN
500.0000 [IU] | Freq: Once | INTRAVENOUS | Status: DC | PRN
  Filled 2016-03-15: qty 5

## 2016-03-15 MED ORDER — SODIUM CHLORIDE 0.9% FLUSH
10.0000 mL | INTRAVENOUS | Status: DC | PRN
  Administered 2016-03-15: 10 mL
  Filled 2016-03-15: qty 10

## 2016-03-15 MED ORDER — PROCHLORPERAZINE MALEATE 10 MG PO TABS
10.0000 mg | ORAL_TABLET | Freq: Once | ORAL | Status: AC
  Administered 2016-03-15: 10 mg via ORAL

## 2016-03-15 NOTE — Telephone Encounter (Signed)
Gave and printed appt sched and avs fo rpt for May °

## 2016-03-15 NOTE — Patient Instructions (Signed)
[facility] Discharge Instructions for Patients Receiving Chemotherapy  Today you received the following chemotherapy agents :  Abraxane.  To help prevent nausea and vomiting after your treatment, we encourage you to take your nausea medication as prescribed by your physician.   If you develop nausea and vomiting that is not controlled by your nausea medication, call the clinic.   BELOW ARE SYMPTOMS THAT SHOULD BE REPORTED IMMEDIATELY:  *FEVER GREATER THAN 100.5 F  *CHILLS WITH OR WITHOUT FEVER  NAUSEA AND VOMITING THAT IS NOT CONTROLLED WITH YOUR NAUSEA MEDICATION  *UNUSUAL SHORTNESS OF BREATH  *UNUSUAL BRUISING OR BLEEDING  TENDERNESS IN MOUTH AND THROAT WITH OR WITHOUT PRESENCE OF ULCERS  *URINARY PROBLEMS  *BOWEL PROBLEMS  UNUSUAL RASH Items with * indicate a potential emergency and should be followed up as soon as possible.  Feel free to call the clinic you have any questions or concerns. The clinic phone number is [phone].  Please show the CHEMO ALERT CARD at check-in to the Emergency Department and triage nurse.   

## 2016-03-15 NOTE — Progress Notes (Signed)
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 She had nausea after cycle 1 but had done quite well with cycle 2 of treatment. Her taste is coming back and her appetite is adequate. She had left lost about 10 pounds starting chemotherapy. She wants to keep off that weight.  REVIEW OF SYSTEMS:   Constitutional: Denies fevers, chills or abnormal weight loss Eyes: Denies blurriness of vision Ears, nose, mouth, throat, and face: Denies mucositis or sore throat Respiratory: Denies cough, dyspnea or wheezes Cardiovascular: Denies palpitation, chest discomfort Gastrointestinal:  Denies nausea, heartburn or change in bowel habits Skin: Denies abnormal skin rashes Lymphatics: Denies new lymphadenopathy or easy bruising Neurological:Denies numbness, tingling or new weaknesses Behavioral/Psych: Mood is stable, no new changes  Extremities: No lower extremity edema Breast:  denies any pain or lumps or nodules in either breasts All other systems were reviewed with the patient and are negative.  I have reviewed the  past medical history, past surgical history, social history and family history with the patient and they are unchanged from previous note.  ALLERGIES:  has No Known Allergies.  MEDICATIONS:  Current Outpatient Prescriptions  Medication Sig Dispense Refill  . Ascorbic Acid (VITAMIN C) 1000 MG tablet Take 1,000 mg by mouth daily.    . calcium carbonate (OS-CAL) 600 MG TABS Take 600 mg by mouth 2 (two) times daily with a meal.    . cholecalciferol (VITAMIN D) 1000 UNITS tablet Take 1,000 Units by mouth daily.    Marland Kitchen lidocaine-prilocaine (EMLA) cream Apply to affected area once 30 g 3  . LORazepam (ATIVAN) 0.5 MG tablet Take 1 tablet (0.5 mg total) by mouth every 6 (six) hours as needed (Nausea or vomiting). 30 tablet 0  . LORazepam (ATIVAN) 1 MG tablet Take 1 tablet (1 mg total) by mouth every 8 (eight) hours as needed for anxiety (or nausea). 30 tablet 0  . ondansetron (ZOFRAN) 8 MG tablet Take 1 tablet (8 mg total) by mouth 2 (two) times daily as needed (Nausea or vomiting). 30 tablet 1  . oxyCODONE-acetaminophen (ROXICET) 5-325 MG tablet Take 1-2 tablets by mouth every 4 (four) hours as needed. 50 tablet 0  . prochlorperazine (COMPAZINE) 10 MG tablet Take 1 tablet (10 mg total) by mouth every 6 (six) hours as needed (Nausea or vomiting). 30 tablet 1  . VITAMIN E COMPLEX PO Take by mouth.     No current facility-administered medications for this visit.    PHYSICAL EXAMINATION: ECOG PERFORMANCE STATUS: 1 - Symptomatic but completely ambulatory  Filed Vitals:   03/15/16 0910  BP:  120/81  Pulse: 83  Temp: 98.6 F (37 C)  Resp: 18   Filed Weights   03/15/16 0910  Weight: 131 lb 11.2 oz (59.739 kg)    GENERAL:alert, no distress and comfortable SKIN: skin color, texture, turgor are normal, no rashes or significant lesions EYES: normal, Conjunctiva are pink and non-injected, sclera clear OROPHARYNX:no exudate, no erythema and lips, buccal mucosa, and tongue normal  NECK: supple,  thyroid normal size, non-tender, without nodularity LYMPH:  no palpable lymphadenopathy in the cervical, axillary or inguinal LUNGS: clear to auscultation and percussion with normal breathing effort HEART: regular rate & rhythm and no murmurs and no lower extremity edema ABDOMEN:abdomen soft, non-tender and normal bowel sounds MUSCULOSKELETAL:no cyanosis of digits and no clubbing  NEURO: alert & oriented x 3 with fluent speech, no focal motor/sensory deficits EXTREMITIES: No lower extremity edema  LABORATORY DATA:  I have reviewed the data as listed   Chemistry      Component Value Date/Time   NA 140 03/15/2016 0858   K 3.8 03/15/2016 0858   CO2 28 03/15/2016 0858   BUN 5.6* 03/15/2016 0858   CREATININE 0.7 03/15/2016 0858      Component Value Date/Time   CALCIUM 9.7 03/15/2016 0858   ALKPHOS 68 03/15/2016 0858   AST 36* 03/15/2016 0858   ALT 28 03/15/2016 0858   BILITOT <0.30 03/15/2016 0858       Lab Results  Component Value Date   WBC 3.8* 03/15/2016   HGB 11.7 03/15/2016   HCT 36.2 03/15/2016   MCV 92.8 03/15/2016   PLT 361 03/15/2016   NEUTROABS 2.1 03/15/2016     ASSESSMENT & PLAN:  Breast cancer of upper-outer quadrant of left female breast ([place]) Lt Lumpectomy 11/22/15: IDC 1.5 cm, 0/2 LN, grade 2, with Int Grade DCIS, ER 100%, PR 30%, Her 2 Neg Ratio 1.91, T1cN0 (stage 1A), Oncotype DX recurrence score 37, 25% 10 year risk of recurrence  Recommendation:  1. Systemic chemotherapy with dose dense Adriamycin and Cytoxan 4 followed by Abraxane weekly 12 2. Adjuvant radiation therapy followed by 3. Adjuvant antiestrogen therapy -------------------------------------------------------------------------------------------------------------------------- Current Treatment: Completed 4 cycles of Adriamycin and Cytoxan today is Cycle 3/12 Abraxane ECHO: 12/23/15: EF 55-60% Chemotherapy toxicities: 1. Bone discomfort from Neulasta 2. Alopecia 3. Nausea grade 1-2 4.  Fatigue grade 1 5. Neutropenia: Unclear if it is related to dysfunction of the Neulasta onpro injection. Delaying chemotherapy.   Monitoring closely for toxicities Return to clinic in 2 weeks for cycle 4    No orders of the defined types were placed in this encounter.   The patient has a good understanding of the overall plan. she agrees with it. she will call with any problems that may develop before the next visit here.   Rulon Eisenmenger, MD 03/15/2016

## 2016-03-15 NOTE — Assessment & Plan Note (Signed)
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 Systemic chemotherapy with dose dense Adriamycin and Cytoxan 4 followed by Abraxane weekly 12 2. Adjuvant radiation therapy followed by 3. Adjuvant antiestrogen therapy -------------------------------------------------------------------------------------------------------------------------- Current Treatment: Completed 4 cycles of Adriamycin and Cytoxan today is Cycle 3/12 Abraxane ECHO: 12/23/15: EF 55-60% Chemotherapy toxicities: 1. Bone discomfort from Neulasta 2. Alopecia 3. Nausea grade 1-2 4. Fatigue grade 1 5. Neutropenia: Unclear if it is related to dysfunction of the Neulasta onpro injection. Delaying chemotherapy.   Monitoring closely for toxicities Return to clinic in 2 weeks for cycle 4

## 2016-03-22 ENCOUNTER — Ambulatory Visit (HOSPITAL_BASED_OUTPATIENT_CLINIC_OR_DEPARTMENT_OTHER): Payer: BLUE CROSS/BLUE SHIELD

## 2016-03-22 ENCOUNTER — Other Ambulatory Visit (HOSPITAL_BASED_OUTPATIENT_CLINIC_OR_DEPARTMENT_OTHER): Payer: BLUE CROSS/BLUE SHIELD

## 2016-03-22 VITALS — BP 105/71 | HR 81 | Temp 98.2°F | Resp 17

## 2016-03-22 DIAGNOSIS — C50412 Malignant neoplasm of upper-outer quadrant of left female breast: Secondary | ICD-10-CM

## 2016-03-22 DIAGNOSIS — Z5111 Encounter for antineoplastic chemotherapy: Secondary | ICD-10-CM

## 2016-03-22 LAB — CBC WITH DIFFERENTIAL/PLATELET
BASO%: 2.7 % — ABNORMAL HIGH (ref 0.0–2.0)
Basophils Absolute: 0.1 10*3/uL (ref 0.0–0.1)
EOS%: 2.2 % (ref 0.0–7.0)
Eosinophils Absolute: 0.1 10*3/uL (ref 0.0–0.5)
HCT: 36.8 % (ref 34.8–46.6)
HGB: 12.1 g/dL (ref 11.6–15.9)
LYMPH%: 36.9 % (ref 14.0–49.7)
MCH: 30.4 pg (ref 25.1–34.0)
MCHC: 32.9 g/dL (ref 31.5–36.0)
MCV: 92.4 fL (ref 79.5–101.0)
MONO#: 0.3 10*3/uL (ref 0.1–0.9)
MONO%: 7.6 % (ref 0.0–14.0)
NEUT#: 1.7 10*3/uL (ref 1.5–6.5)
NEUT%: 50.6 % (ref 38.4–76.8)
Platelets: 367 10*3/uL (ref 145–400)
RBC: 3.98 10*6/uL (ref 3.70–5.45)
RDW: 18 % — ABNORMAL HIGH (ref 11.2–14.5)
WBC: 3.4 10*3/uL — ABNORMAL LOW (ref 3.9–10.3)
lymph#: 1.3 10*3/uL (ref 0.9–3.3)

## 2016-03-22 LAB — COMPREHENSIVE METABOLIC PANEL
ALT: 33 U/L (ref 0–55)
AST: 42 U/L — ABNORMAL HIGH (ref 5–34)
Albumin: 3.8 g/dL (ref 3.5–5.0)
Alkaline Phosphatase: 87 U/L (ref 40–150)
Anion Gap: 11 mEq/L (ref 3–11)
BUN: 7.7 mg/dL (ref 7.0–26.0)
CO2: 25 mEq/L (ref 22–29)
Calcium: 10.1 mg/dL (ref 8.4–10.4)
Chloride: 105 mEq/L (ref 98–109)
Creatinine: 0.7 mg/dL (ref 0.6–1.1)
EGFR: >90 mL/min/{1.73_m2} (ref >90)
Glucose: 76 mg/dl (ref 70–140)
Potassium: 4.3 mEq/L (ref 3.5–5.1)
Sodium: 141 mEq/L (ref 136–145)
Total Bilirubin: 0.38 mg/dL (ref 0.20–1.20)
Total Protein: 7.6 g/dL (ref 6.4–8.3)

## 2016-03-22 MED ORDER — SODIUM CHLORIDE 0.9% FLUSH
10.0000 mL | INTRAVENOUS | Status: DC | PRN
  Administered 2016-03-22: 10 mL
  Filled 2016-03-22: qty 10

## 2016-03-22 MED ORDER — PROCHLORPERAZINE MALEATE 10 MG PO TABS
10.0000 mg | ORAL_TABLET | Freq: Once | ORAL | Status: AC
  Administered 2016-03-22: 10 mg via ORAL

## 2016-03-22 MED ORDER — HEPARIN SOD (PORK) LOCK FLUSH 100 UNIT/ML IV SOLN
500.0000 [IU] | Freq: Once | INTRAVENOUS | Status: AC | PRN
  Administered 2016-03-22: 500 [IU]
  Filled 2016-03-22: qty 5

## 2016-03-22 MED ORDER — SODIUM CHLORIDE 0.9 % IV SOLN
Freq: Once | INTRAVENOUS | Status: AC
  Administered 2016-03-22: 10:00:00 via INTRAVENOUS

## 2016-03-22 MED ORDER — PACLITAXEL PROTEIN-BOUND CHEMO INJECTION 100 MG
80.0000 mg/m2 | Freq: Once | INTRAVENOUS | Status: AC
  Administered 2016-03-22: 125 mg via INTRAVENOUS
  Filled 2016-03-22: qty 25

## 2016-03-22 MED ORDER — PROCHLORPERAZINE MALEATE 10 MG PO TABS
ORAL_TABLET | ORAL | Status: AC
  Filled 2016-03-22: qty 1

## 2016-03-22 NOTE — Patient Instructions (Signed)
[facility] Discharge Instructions for Patients Receiving Chemotherapy  Today you received the following chemotherapy agents Abraxane To help prevent nausea and vomiting after your treatment, we encourage you to take your nausea medication as prescribed.   If you develop nausea and vomiting that is not controlled by your nausea medication, call the clinic.   BELOW ARE SYMPTOMS THAT SHOULD BE REPORTED IMMEDIATELY:  *FEVER GREATER THAN 100.5 F  *CHILLS WITH OR WITHOUT FEVER  NAUSEA AND VOMITING THAT IS NOT CONTROLLED WITH YOUR NAUSEA MEDICATION  *UNUSUAL SHORTNESS OF BREATH  *UNUSUAL BRUISING OR BLEEDING  TENDERNESS IN MOUTH AND THROAT WITH OR WITHOUT PRESENCE OF ULCERS  *URINARY PROBLEMS  *BOWEL PROBLEMS  UNUSUAL RASH Items with * indicate a potential emergency and should be followed up as soon as possible.  Feel free to call the clinic you have any questions or concerns. The clinic phone number is [phone].  Please show the CHEMO ALERT CARD at check-in to the Emergency Department and triage nurse.   

## 2016-03-29 ENCOUNTER — Encounter: Payer: Self-pay | Admitting: Hematology and Oncology

## 2016-03-29 ENCOUNTER — Ambulatory Visit (HOSPITAL_BASED_OUTPATIENT_CLINIC_OR_DEPARTMENT_OTHER): Payer: BLUE CROSS/BLUE SHIELD | Admitting: Hematology and Oncology

## 2016-03-29 ENCOUNTER — Other Ambulatory Visit: Payer: Self-pay | Admitting: *Deleted

## 2016-03-29 ENCOUNTER — Ambulatory Visit: Payer: BLUE CROSS/BLUE SHIELD

## 2016-03-29 ENCOUNTER — Encounter: Payer: Self-pay | Admitting: *Deleted

## 2016-03-29 ENCOUNTER — Other Ambulatory Visit (HOSPITAL_BASED_OUTPATIENT_CLINIC_OR_DEPARTMENT_OTHER): Payer: BLUE CROSS/BLUE SHIELD

## 2016-03-29 ENCOUNTER — Ambulatory Visit (HOSPITAL_BASED_OUTPATIENT_CLINIC_OR_DEPARTMENT_OTHER): Payer: BLUE CROSS/BLUE SHIELD

## 2016-03-29 ENCOUNTER — Telehealth: Payer: Self-pay | Admitting: Hematology and Oncology

## 2016-03-29 ENCOUNTER — Other Ambulatory Visit: Payer: BLUE CROSS/BLUE SHIELD

## 2016-03-29 VITALS — BP 108/75 | HR 83 | Temp 98.4°F | Resp 18 | Wt 131.9 lb

## 2016-03-29 DIAGNOSIS — Z5111 Encounter for antineoplastic chemotherapy: Secondary | ICD-10-CM | POA: Diagnosis not present

## 2016-03-29 DIAGNOSIS — C50412 Malignant neoplasm of upper-outer quadrant of left female breast: Secondary | ICD-10-CM

## 2016-03-29 LAB — CBC WITH DIFFERENTIAL/PLATELET
BASO%: 2.2 % — ABNORMAL HIGH (ref 0.0–2.0)
Basophils Absolute: 0.1 10*3/uL (ref 0.0–0.1)
EOS%: 1.1 % (ref 0.0–7.0)
Eosinophils Absolute: 0 10*3/uL (ref 0.0–0.5)
HCT: 33.6 % — ABNORMAL LOW (ref 34.8–46.6)
HGB: 11.3 g/dL — ABNORMAL LOW (ref 11.6–15.9)
LYMPH%: 37.6 % (ref 14.0–49.7)
MCH: 31.1 pg (ref 25.1–34.0)
MCHC: 33.6 g/dL (ref 31.5–36.0)
MCV: 92.6 fL (ref 79.5–101.0)
MONO#: 0.2 10*3/uL (ref 0.1–0.9)
MONO%: 6.5 % (ref 0.0–14.0)
NEUT#: 1.5 10*3/uL (ref 1.5–6.5)
NEUT%: 52.6 % (ref 38.4–76.8)
Platelets: 354 10*3/uL (ref 145–400)
RBC: 3.63 10*6/uL — ABNORMAL LOW (ref 3.70–5.45)
RDW: 17.7 % — ABNORMAL HIGH (ref 11.2–14.5)
WBC: 2.8 10*3/uL — ABNORMAL LOW (ref 3.9–10.3)
lymph#: 1.1 10*3/uL (ref 0.9–3.3)

## 2016-03-29 LAB — COMPREHENSIVE METABOLIC PANEL
ALT: 29 U/L (ref 0–55)
AST: 37 U/L — ABNORMAL HIGH (ref 5–34)
Albumin: 3.7 g/dL (ref 3.5–5.0)
Alkaline Phosphatase: 81 U/L (ref 40–150)
Anion Gap: 8 mEq/L (ref 3–11)
BUN: 8.1 mg/dL (ref 7.0–26.0)
CO2: 27 mEq/L (ref 22–29)
Calcium: 9.6 mg/dL (ref 8.4–10.4)
Chloride: 105 mEq/L (ref 98–109)
Creatinine: 0.7 mg/dL (ref 0.6–1.1)
EGFR: >90 mL/min/{1.73_m2} (ref >90)
Glucose: 117 mg/dl (ref 70–140)
Potassium: 4.1 mEq/L (ref 3.5–5.1)
Sodium: 140 mEq/L (ref 136–145)
Total Bilirubin: 0.37 mg/dL (ref 0.20–1.20)
Total Protein: 7 g/dL (ref 6.4–8.3)

## 2016-03-29 MED ORDER — PROCHLORPERAZINE MALEATE 10 MG PO TABS
ORAL_TABLET | ORAL | Status: AC
  Filled 2016-03-29: qty 1

## 2016-03-29 MED ORDER — PACLITAXEL PROTEIN-BOUND CHEMO INJECTION 100 MG
65.0000 mg/m2 | Freq: Once | INTRAVENOUS | Status: AC
  Administered 2016-03-29: 100 mg via INTRAVENOUS
  Filled 2016-03-29: qty 20

## 2016-03-29 MED ORDER — PROCHLORPERAZINE MALEATE 10 MG PO TABS
10.0000 mg | ORAL_TABLET | Freq: Once | ORAL | Status: AC
  Administered 2016-03-29: 10 mg via ORAL

## 2016-03-29 MED ORDER — SODIUM CHLORIDE 0.9 % IV SOLN
Freq: Once | INTRAVENOUS | Status: AC
  Administered 2016-03-29: 14:00:00 via INTRAVENOUS

## 2016-03-29 MED ORDER — SODIUM CHLORIDE 0.9% FLUSH
10.0000 mL | INTRAVENOUS | Status: DC | PRN
  Administered 2016-03-29: 10 mL
  Filled 2016-03-29: qty 10

## 2016-03-29 MED ORDER — HEPARIN SOD (PORK) LOCK FLUSH 100 UNIT/ML IV SOLN
500.0000 [IU] | Freq: Once | INTRAVENOUS | Status: AC | PRN
  Administered 2016-03-29: 500 [IU]
  Filled 2016-03-29: qty 5

## 2016-03-29 NOTE — Progress Notes (Signed)
Patient Care Team: Pcp Not In System as PCP - General  SUMMARY OF ONCOLOGIC HISTORY:   Breast cancer of upper-outer quadrant of left female breast ([place])   10/19/2015 Mammogram Screening mammogram revealed left breast asymmetry 1.1 x 0.8 x 1 cm, T1 cN0 stage IA clinical stage   11/03/2015 Initial Diagnosis Left breast biopsy 11:30 position: Invasive ductal carcinoma with DCIS, grade 2, ER 100%, PR 30%, Ki-67 90%, HER-2 negative ratio 1.44   11/22/2015 Surgery Lt Lumpectomy: IDC 1.5 cm, 0/2 LN, grade 2, with Int Grade DCIS, ER 100%, PR 30%, Her 2 Neg Ratio 1.91, T1cN0 (stage 1A)Oncotype DX score 37, 10 year ROR 25%   12/29/2015 -  Chemotherapy Adjuvant chemotherapy with dose dense Adriamycin Cytoxan 4 followed by Abraxane weekly 12    CHIEF COMPLIANT: Cycle 6 Abraxane  INTERVAL HISTORY: Heather [name] is a 60 year old with above-mentioned history of left breast cancer currently on adjuvant chemotherapy. Cycle 6 of Abraxane. 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 She is also noticed some more increase in fatigue. Denies any nausea or vomiting.  REVIEW OF SYSTEMS:   Constitutional: Denies fevers, chills or abnormal weight loss Eyes: Denies blurriness of vision Ears, nose, mouth, throat, and face: Denies mucositis or sore throat Respiratory: Denies cough, dyspnea or wheezes Cardiovascular: Denies palpitation, chest discomfort Gastrointestinal:  Denies nausea, heartburn or change in bowel habits Skin: Denies abnormal skin rashes Lymphatics: Denies new lymphadenopathy or easy bruising Neurological:Denies numbness, tingling or new weaknesses Behavioral/Psych: Mood is stable, no new changes  Extremities: No lower extremity edema Breast:  denies any pain or lumps or nodules in either breasts All other systems were reviewed with the patient and are negative.  I have reviewed the past medical history, past surgical history, social history and family history with the  patient and they are unchanged from previous note.  ALLERGIES:  has No Known Allergies.  MEDICATIONS:  Current Outpatient Prescriptions  Medication Sig Dispense Refill  . Ascorbic Acid (VITAMIN C) 1000 MG tablet Take 1,000 mg by mouth daily.    . calcium carbonate (OS-CAL) 600 MG TABS Take 600 mg by mouth 2 (two) times daily with a meal.    . cholecalciferol (VITAMIN D) 1000 UNITS tablet Take 1,000 Units by mouth daily.    Marland Kitchen lidocaine-prilocaine (EMLA) cream Apply to affected area once 30 g 3  . LORazepam (ATIVAN) 0.5 MG tablet Take 1 tablet (0.5 mg total) by mouth every 6 (six) hours as needed (Nausea or vomiting). 30 tablet 0  . LORazepam (ATIVAN) 1 MG tablet Take 1 tablet (1 mg total) by mouth every 8 (eight) hours as needed for anxiety (or nausea). 30 tablet 0  . ondansetron (ZOFRAN) 8 MG tablet Take 1 tablet (8 mg total) by mouth 2 (two) times daily as needed (Nausea or vomiting). 30 tablet 1  . oxyCODONE-acetaminophen (ROXICET) 5-325 MG tablet Take 1-2 tablets by mouth every 4 (four) hours as needed. 50 tablet 0  . prochlorperazine (COMPAZINE) 10 MG tablet Take 1 tablet (10 mg total) by mouth every 6 (six) hours as needed (Nausea or vomiting). 30 tablet 1  . VITAMIN E COMPLEX PO Take by mouth.     No current facility-administered medications for this visit.    PHYSICAL EXAMINATION: ECOG PERFORMANCE STATUS: 1 - Symptomatic but completely ambulatory  There were no vitals filed for this visit. There were no vitals filed for this visit.  GENERAL:alert, no distress and comfortable SKIN: skin color, texture,  turgor are normal, no rashes or significant lesions EYES: normal, Conjunctiva are pink and non-injected, sclera clear OROPHARYNX:no exudate, no erythema and lips, buccal mucosa, and tongue normal  NECK: supple, thyroid normal size, non-tender, without nodularity LYMPH:  no palpable lymphadenopathy in the cervical, axillary or inguinal LUNGS: clear to auscultation and percussion  with normal breathing effort HEART: regular rate & rhythm and no murmurs and no lower extremity edema ABDOMEN:abdomen soft, non-tender and normal bowel sounds MUSCULOSKELETAL:no cyanosis of digits and no clubbing  NEURO: alert & oriented x 3 with fluent speech, no focal motor/sensory deficits EXTREMITIES: No lower extremity edema  LABORATORY DATA:  I have reviewed the data as listed   Chemistry      Component Value Date/Time   NA 141 03/22/2016 0914   K 4.3 03/22/2016 0914   CO2 25 03/22/2016 0914   BUN 7.7 03/22/2016 0914   CREATININE 0.7 03/22/2016 0914      Component Value Date/Time   CALCIUM 10.1 03/22/2016 0914   ALKPHOS 87 03/22/2016 0914   AST 42* 03/22/2016 0914   ALT 33 03/22/2016 0914   BILITOT 0.38 03/22/2016 0914       Lab Results  Component Value Date   WBC 2.8* 03/29/2016   HGB 11.3* 03/29/2016   HCT 33.6* 03/29/2016   MCV 92.6 03/29/2016   PLT 354 03/29/2016   NEUTROABS 1.5 03/29/2016     ASSESSMENT & PLAN:  Breast cancer of upper-outer quadrant of left female breast ([place]) Lt Lumpectomy 11/22/15: IDC 1.5 cm, 0/2 LN, grade 2, with Int Grade DCIS, ER 100%, PR 30%, Her 2 Neg Ratio 1.91, T1cN0 (stage 1A), Oncotype DX recurrence score 37, 25% 10 year risk of recurrence  Recommendation:  1. Systemic chemotherapy with dose dense Adriamycin and Cytoxan 4 followed by Abraxane weekly 12 2. Adjuvant radiation therapy followed by 3. Adjuvant antiestrogen therapy -------------------------------------------------------------------------------------------------------------------------- Current Treatment: Completed 4 cycles of Adriamycin and Cytoxan today is Cycle 5/12 Abraxane ECHO: 12/23/15: EF 55-60% Chemotherapy toxicities: 1. Bone discomfort from Neulasta 2. Alopecia 3. Nausea grade 1-2 4. Fatigue grade 1 5. Neutropenia: I decreased the dose if Abraxane with cycle 5  Monitoring closely for toxicities Return to clinic in 2 weeks for cycle 7   No orders  of the defined types were placed in this encounter.   The patient has a good understanding of the overall plan. she agrees with it. she will call with any problems that may develop before the next visit here.   Rulon Eisenmenger, MD 03/29/2016

## 2016-03-29 NOTE — Assessment & Plan Note (Signed)
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 Systemic chemotherapy with dose dense Adriamycin and Cytoxan 4 followed by Abraxane weekly 12 2. Adjuvant radiation therapy followed by 3. Adjuvant antiestrogen therapy -------------------------------------------------------------------------------------------------------------------------- Current Treatment: Completed 4 cycles of Adriamycin and Cytoxan today is Cycle 5/12 Abraxane ECHO: 12/23/15: EF 55-60% Chemotherapy toxicities: 1. Bone discomfort from Neulasta 2. Alopecia 3. Nausea grade 1-2 4. Fatigue grade 1 5. Neutropenia: Unclear if it is related to dysfunction of the Neulasta onpro injection. Delaying chemotherapy.   Monitoring closely for toxicities Return to clinic in 2 weeks for cycle 7

## 2016-03-29 NOTE — Patient Instructions (Signed)
[facility] Discharge Instructions for Patients Receiving Chemotherapy  Today you received the following chemotherapy agents Abraxane  To help prevent nausea and vomiting after your treatment, we encourage you to take your nausea medication    If you develop nausea and vomiting that is not controlled by your nausea medication, call the clinic.   BELOW ARE SYMPTOMS THAT SHOULD BE REPORTED IMMEDIATELY:  *FEVER GREATER THAN 100.5 F  *CHILLS WITH OR WITHOUT FEVER  NAUSEA AND VOMITING THAT IS NOT CONTROLLED WITH YOUR NAUSEA MEDICATION  *UNUSUAL SHORTNESS OF BREATH  *UNUSUAL BRUISING OR BLEEDING  TENDERNESS IN MOUTH AND THROAT WITH OR WITHOUT PRESENCE OF ULCERS  *URINARY PROBLEMS  *BOWEL PROBLEMS  UNUSUAL RASH Items with * indicate a potential emergency and should be followed up as soon as possible.  Feel free to call the clinic you have any questions or concerns. The clinic phone number is [phone].  Please show the CHEMO ALERT CARD at check-in to the Emergency Department and triage nurse.   

## 2016-03-29 NOTE — Telephone Encounter (Signed)
appt made and avs printed °

## 2016-04-03 ENCOUNTER — Encounter (HOSPITAL_COMMUNITY): Payer: Self-pay | Admitting: Internal Medicine

## 2016-04-03 ENCOUNTER — Ambulatory Visit (HOSPITAL_COMMUNITY)
Admission: RE | Admit: 2016-04-03 | Discharge: 2016-04-03 | Disposition: A | Discharge status: Home / Self Care | Payer: BLUE CROSS/BLUE SHIELD | Source: Ambulatory Visit | Attending: Internal Medicine | Admitting: Internal Medicine

## 2016-04-03 ENCOUNTER — Ambulatory Visit (HOSPITAL_COMMUNITY): Payer: BLUE CROSS/BLUE SHIELD

## 2016-04-03 ENCOUNTER — Ambulatory Visit (HOSPITAL_BASED_OUTPATIENT_CLINIC_OR_DEPARTMENT_OTHER)
Admission: RE | Admit: 2016-04-03 | Discharge: 2016-04-03 | Disposition: A | Discharge status: Home / Self Care | Payer: BLUE CROSS/BLUE SHIELD | Source: Ambulatory Visit | Attending: Internal Medicine | Admitting: Internal Medicine

## 2016-04-03 ENCOUNTER — Other Ambulatory Visit (HOSPITAL_COMMUNITY): Payer: Self-pay | Admitting: *Deleted

## 2016-04-03 VITALS — BP 122/74 | HR 76 | Wt 131.5 lb

## 2016-04-03 DIAGNOSIS — C50412 Malignant neoplasm of upper-outer quadrant of left female breast: Secondary | ICD-10-CM | POA: Diagnosis not present

## 2016-04-03 DIAGNOSIS — Z79899 Other long term (current) drug therapy: Secondary | ICD-10-CM | POA: Insufficient documentation

## 2016-04-03 DIAGNOSIS — Z833 Family history of diabetes mellitus: Secondary | ICD-10-CM | POA: Diagnosis not present

## 2016-04-03 DIAGNOSIS — Z170 Estrogen receptor positive status [ER+]: Secondary | ICD-10-CM | POA: Diagnosis not present

## 2016-04-03 DIAGNOSIS — Z8249 Family history of ischemic heart disease and other diseases of the circulatory system: Secondary | ICD-10-CM | POA: Diagnosis not present

## 2016-04-03 LAB — ECHOCARDIOGRAM COMPLETE
E decel time: 162 msec
E/e' ratio: 8
FS: 38 % (ref 28–44)
IVS/LV PW RATIO, ED: 0.99
LA ID, A-P, ES: 40 cm
LA diam end sys: 40 cm
LA diam index: 2.51 cm/m2
LA vol A4C: 44.9 ml
LA vol index: 19.6 mL/m2
LA vol: 31.3 cm3
LV E/e' medial: 8
LV E/e'average: 8
LV PW d: 9.24 mm — AB (ref 0.6–1.1)
LV e' LATERAL: 9.46 cm/s
LVOT area: 2.84 cm2
LVOT diameter: 19 mm
MV Dec: 162
MV Peak grad: 2 mmHg
MV pk A vel: 90.2 m/s
MV pk E vel: 75.7 m/s
TDI e' lateral: 9.46
TDI e' medial: 5.22
Weight: 2104 oz

## 2016-04-03 NOTE — Addendum Note (Signed)
Encounter addended by: Scarlette Calico, RN on: 04/03/2016  3:47 PM<BR>     Documentation filed: Dx [employer], Patient Instructions Section, Orders

## 2016-04-03 NOTE — Progress Notes (Signed)
Patient ID: Heather [name], female   DOB: Mar 07, 1956, 60 y.o.   MRN: [id number]    Cardiology Oncology Clinic Note   Referring Physician: Dr. Lindi Adie Primary Care: Dr Karle Starch Endocrinologist: Dr Toney Rakes  Primary Cardiologist: None  HPI: Heather [name] is a 60 y.o. woman with left breast cancer s/p Left lumpectomy referred to the cardio-oncology clinic by Dr. Lindi Adie.   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  She presents today for regular follow up. She is s/p Adriamycin and Cytoxan x 4 and 5/12 of Abraxane. Scheduled for dose 6/12 next week.    She feels good overall. Has no symptoms. No CP. No SOB, orthopnea, PND, lightheadedness, or dizziness. She does have a distant history of an episode of vertigo. She takes no medications on a regular basis.  Family cardiac history is relatively unremarkable apart from brother and sister with HTN.   Echo: 12/23/15 LVEF 55-60% Lat s' 10.7 cm/sec GLS -19.1 Echo 04/03/16: LVEF 60-65% lat s' 11.2 cm/s GLS -20.1%  Social: Lives in [place] with husband. Does not work, but occasionally volunteers.    Past Medical History  Diagnosis Date  . NSVD (normal spontaneous vaginal delivery)   . Cancer [facility])     left breast cancer DCIS  . GERD (gastroesophageal reflux disease)     not recently  . PONV (postoperative nausea and vomiting)     Current Outpatient Prescriptions  Medication Sig Dispense Refill  . Ascorbic Acid (VITAMIN C) 1000 MG tablet Take 1,000 mg by mouth daily.    . calcium carbonate (OS-CAL) 600 MG TABS Take 600 mg by mouth 2 (two) times daily with a meal.    . cholecalciferol (VITAMIN D) 1000 UNITS tablet Take 1,000 Units by mouth daily.    Marland Kitchen lidocaine-prilocaine (EMLA) cream Apply to affected area once 30 g 3  . VITAMIN E COMPLEX PO Take by mouth.    . ondansetron  (ZOFRAN) 8 MG tablet Take 1 tablet (8 mg total) by mouth 2 (two) times daily as needed (Nausea or vomiting). (Patient not taking: Reported on 04/03/2016) 30 tablet 1  . oxyCODONE-acetaminophen (ROXICET) 5-325 MG tablet Take 1-2 tablets by mouth every 4 (four) hours as needed. (Patient not taking: Reported on 04/03/2016) 50 tablet 0  . prochlorperazine (COMPAZINE) 10 MG tablet Take 1 tablet (10 mg total) by mouth every 6 (six) hours as needed (Nausea or vomiting). (Patient not taking: Reported on 04/03/2016) 30 tablet 1   No current facility-administered medications for this encounter.    No Known Allergies    Social History   Social History  . Marital Status: Married    Spouse Name: N/A  . Number of Children: N/A  . Years of Education: N/A   Occupational History  . Not on file.   Social History Main Topics  . Smoking status: Never Smoker   . Smokeless tobacco: Never Used  . Alcohol Use: 0.0 oz/week    0 Standard drinks or equivalent per week     Comment: occ  . Drug Use: No  . Sexual Activity: Yes    Birth Control/ Protection: Surgical     Comment: hyst, intercourse age 21, less than 5 sexual partners   Other Topics Concern  . Not on file   Social History Narrative  Family History  Problem Relation Age of Onset  . Diabetes Brother   . Hypertension Brother   . Ulcers Mother   . Cirrhosis Father   . Hypertension Sister     Danley Danker Vitals:   04/03/16 1521  BP: 122/74  Pulse: 76  Weight: 131 lb 8 oz (59.648 kg)  SpO2: 100%    PHYSICAL EXAM: General:  Well appearing. No respiratory difficulty HEENT: normal Neck: supple. no JVD appreciated. Carotids 2+ bilat; no bruits. No thyromegaly or nodule noted.  Cor: PMI nondisplaced. RRR. No M/G/R Lungs: CTAB, normal effort.  Abdomen: soft, NT, ND, no HSM. No bruits or masses. +BS  Extremities: no cyanosis, clubbing, rash, edema Neuro: alert & oriented x 3, cranial nerves grossly intact. moves all 4 extremities w/o  difficulty. Affect pleasant.   ASSESSMENT & PLAN:  1. Breast Cancer, Left  - Todays echo shows stable parameters.  - Now s/p Adriamycin and Cytoxan x 4 and 5/12 of Abraxane weekly  Return in 6 months with echo.   Legrand Como [id number] Calhoun, PA-C 04/03/2016 3:27 PM   Patient seen and examined with Oda Kilts, PA-C. We discussed all aspects of the encounter. I agree with the assessment and plan as stated above.   I reviewed echos personally. EF and Doppler parameters stable. No HF on exam. Adriamycin is complete. Will repeat echo in 6-9 months.   Aftyn Nott,MD 3:36 PM

## 2016-04-03 NOTE — Progress Notes (Signed)
Echocardiogram 2D Echocardiogram has been performed.  Tresa Res 04/03/2016, 3:33 PM

## 2016-04-03 NOTE — Patient Instructions (Signed)
We will contact you in 6 months to schedule your next appointment and echocardiogram  

## 2016-04-05 ENCOUNTER — Other Ambulatory Visit: Payer: Self-pay | Admitting: Hematology and Oncology

## 2016-04-05 ENCOUNTER — Ambulatory Visit (HOSPITAL_BASED_OUTPATIENT_CLINIC_OR_DEPARTMENT_OTHER): Payer: BLUE CROSS/BLUE SHIELD

## 2016-04-05 ENCOUNTER — Other Ambulatory Visit (HOSPITAL_BASED_OUTPATIENT_CLINIC_OR_DEPARTMENT_OTHER): Payer: BLUE CROSS/BLUE SHIELD

## 2016-04-05 VITALS — BP 116/63 | HR 90 | Temp 97.9°F | Resp 18

## 2016-04-05 DIAGNOSIS — C50412 Malignant neoplasm of upper-outer quadrant of left female breast: Secondary | ICD-10-CM | POA: Diagnosis not present

## 2016-04-05 DIAGNOSIS — Z5111 Encounter for antineoplastic chemotherapy: Secondary | ICD-10-CM | POA: Diagnosis not present

## 2016-04-05 LAB — COMPREHENSIVE METABOLIC PANEL
ALT: 35 U/L (ref 0–55)
AST: 44 U/L — ABNORMAL HIGH (ref 5–34)
Albumin: 4 g/dL (ref 3.5–5.0)
Alkaline Phosphatase: 91 U/L (ref 40–150)
Anion Gap: 8 mEq/L (ref 3–11)
BUN: 5.8 mg/dL — ABNORMAL LOW (ref 7.0–26.0)
CO2: 28 mEq/L (ref 22–29)
Calcium: 9.9 mg/dL (ref 8.4–10.4)
Chloride: 105 mEq/L (ref 98–109)
Creatinine: 0.7 mg/dL (ref 0.6–1.1)
EGFR: >90 mL/min/{1.73_m2} (ref >90)
Glucose: 75 mg/dl (ref 70–140)
Potassium: 3.6 mEq/L (ref 3.5–5.1)
Sodium: 141 mEq/L (ref 136–145)
Total Bilirubin: 0.44 mg/dL (ref 0.20–1.20)
Total Protein: 7.5 g/dL (ref 6.4–8.3)

## 2016-04-05 LAB — CBC WITH DIFFERENTIAL/PLATELET
BASO%: 2.7 % — ABNORMAL HIGH (ref 0.0–2.0)
Basophils Absolute: 0.1 10*3/uL (ref 0.0–0.1)
EOS%: 1.1 % (ref 0.0–7.0)
Eosinophils Absolute: 0 10*3/uL (ref 0.0–0.5)
HCT: 39.1 % (ref 34.8–46.6)
HGB: 12.7 g/dL (ref 11.6–15.9)
LYMPH%: 37.9 % (ref 14.0–49.7)
MCH: 30.7 pg (ref 25.1–34.0)
MCHC: 32.5 g/dL (ref 31.5–36.0)
MCV: 94.3 fL (ref 79.5–101.0)
MONO#: 0.2 10*3/uL (ref 0.1–0.9)
MONO%: 8.4 % (ref 0.0–14.0)
NEUT#: 1.5 10*3/uL (ref 1.5–6.5)
NEUT%: 49.9 % (ref 38.4–76.8)
Platelets: 302 10*3/uL (ref 145–400)
RBC: 4.15 10*6/uL (ref 3.70–5.45)
RDW: 19.5 % — ABNORMAL HIGH (ref 11.2–14.5)
WBC: 2.9 10*3/uL — ABNORMAL LOW (ref 3.9–10.3)
lymph#: 1.1 10*3/uL (ref 0.9–3.3)

## 2016-04-05 MED ORDER — HEPARIN SOD (PORK) LOCK FLUSH 100 UNIT/ML IV SOLN
500.0000 [IU] | Freq: Once | INTRAVENOUS | Status: AC | PRN
  Administered 2016-04-05: 500 [IU]
  Filled 2016-04-05: qty 5

## 2016-04-05 MED ORDER — PROCHLORPERAZINE MALEATE 10 MG PO TABS
10.0000 mg | ORAL_TABLET | Freq: Once | ORAL | Status: AC
  Administered 2016-04-05: 10 mg via ORAL

## 2016-04-05 MED ORDER — PROCHLORPERAZINE MALEATE 10 MG PO TABS
ORAL_TABLET | ORAL | Status: AC
  Filled 2016-04-05: qty 1

## 2016-04-05 MED ORDER — SODIUM CHLORIDE 0.9% FLUSH
10.0000 mL | INTRAVENOUS | Status: DC | PRN
  Administered 2016-04-05: 10 mL
  Filled 2016-04-05: qty 10

## 2016-04-05 MED ORDER — PACLITAXEL PROTEIN-BOUND CHEMO INJECTION 100 MG
50.0000 mg/m2 | Freq: Once | INTRAVENOUS | Status: AC
  Administered 2016-04-05: 75 mg via INTRAVENOUS
  Filled 2016-04-05: qty 15

## 2016-04-05 MED ORDER — SODIUM CHLORIDE 0.9 % IV SOLN
Freq: Once | INTRAVENOUS | Status: AC
  Administered 2016-04-05: 09:00:00 via INTRAVENOUS

## 2016-04-05 NOTE — Patient Instructions (Signed)
[facility] Discharge Instructions for Patients Receiving Chemotherapy  Today you received the following chemotherapy agents Abraxane  To help prevent nausea and vomiting after your treatment, we encourage you to take your nausea medication    If you develop nausea and vomiting that is not controlled by your nausea medication, call the clinic.   BELOW ARE SYMPTOMS THAT SHOULD BE REPORTED IMMEDIATELY:  *FEVER GREATER THAN 100.5 F  *CHILLS WITH OR WITHOUT FEVER  NAUSEA AND VOMITING THAT IS NOT CONTROLLED WITH YOUR NAUSEA MEDICATION  *UNUSUAL SHORTNESS OF BREATH  *UNUSUAL BRUISING OR BLEEDING  TENDERNESS IN MOUTH AND THROAT WITH OR WITHOUT PRESENCE OF ULCERS  *URINARY PROBLEMS  *BOWEL PROBLEMS  UNUSUAL RASH Items with * indicate a potential emergency and should be followed up as soon as possible.  Feel free to call the clinic you have any questions or concerns. The clinic phone number is [phone].  Please show the CHEMO ALERT CARD at check-in to the Emergency Department and triage nurse.   

## 2016-04-05 NOTE — Progress Notes (Signed)
At [phone] Dr. Lindi Adie called and reported patients complaint of numbness in feet and left hand. Reported complaint of numbness in right hand last night, with no problems today. Dr. Lindi Adie stated he would reduce treatment dose today and follow-up with patient next week.

## 2016-04-13 ENCOUNTER — Ambulatory Visit (HOSPITAL_BASED_OUTPATIENT_CLINIC_OR_DEPARTMENT_OTHER): Payer: BLUE CROSS/BLUE SHIELD | Admitting: Hematology and Oncology

## 2016-04-13 ENCOUNTER — Other Ambulatory Visit (HOSPITAL_BASED_OUTPATIENT_CLINIC_OR_DEPARTMENT_OTHER): Payer: BLUE CROSS/BLUE SHIELD

## 2016-04-13 ENCOUNTER — Encounter: Payer: Self-pay | Admitting: Hematology and Oncology

## 2016-04-13 ENCOUNTER — Ambulatory Visit (HOSPITAL_BASED_OUTPATIENT_CLINIC_OR_DEPARTMENT_OTHER): Payer: BLUE CROSS/BLUE SHIELD

## 2016-04-13 VITALS — BP 121/86 | HR 88 | Temp 98.4°F | Resp 18 | Ht 59.0 in | Wt 130.1 lb

## 2016-04-13 DIAGNOSIS — C50412 Malignant neoplasm of upper-outer quadrant of left female breast: Secondary | ICD-10-CM

## 2016-04-13 DIAGNOSIS — Z5111 Encounter for antineoplastic chemotherapy: Secondary | ICD-10-CM

## 2016-04-13 DIAGNOSIS — G629 Polyneuropathy, unspecified: Secondary | ICD-10-CM

## 2016-04-13 LAB — COMPREHENSIVE METABOLIC PANEL
ALT: 39 U/L (ref 0–55)
AST: 41 U/L — ABNORMAL HIGH (ref 5–34)
Albumin: 3.9 g/dL (ref 3.5–5.0)
Alkaline Phosphatase: 94 U/L (ref 40–150)
Anion Gap: 8 mEq/L (ref 3–11)
BUN: 10.4 mg/dL (ref 7.0–26.0)
CO2: 28 mEq/L (ref 22–29)
Calcium: 9.8 mg/dL (ref 8.4–10.4)
Chloride: 105 mEq/L (ref 98–109)
Creatinine: 0.7 mg/dL (ref 0.6–1.1)
EGFR: >90 mL/min/{1.73_m2} (ref >90)
Glucose: 74 mg/dl (ref 70–140)
Potassium: 3.6 mEq/L (ref 3.5–5.1)
Sodium: 141 mEq/L (ref 136–145)
Total Bilirubin: 0.43 mg/dL (ref 0.20–1.20)
Total Protein: 7.4 g/dL (ref 6.4–8.3)

## 2016-04-13 LAB — CBC WITH DIFFERENTIAL/PLATELET
BASO%: 2.3 % — ABNORMAL HIGH (ref 0.0–2.0)
Basophils Absolute: 0.1 10*3/uL (ref 0.0–0.1)
EOS%: 1.7 % (ref 0.0–7.0)
Eosinophils Absolute: 0.1 10*3/uL (ref 0.0–0.5)
HCT: 38.6 % (ref 34.8–46.6)
HGB: 12.6 g/dL (ref 11.6–15.9)
LYMPH%: 29.9 % (ref 14.0–49.7)
MCH: 31.2 pg (ref 25.1–34.0)
MCHC: 32.6 g/dL (ref 31.5–36.0)
MCV: 95.8 fL (ref 79.5–101.0)
MONO#: 0.3 10*3/uL (ref 0.1–0.9)
MONO%: 8.5 % (ref 0.0–14.0)
NEUT#: 2.2 10*3/uL (ref 1.5–6.5)
NEUT%: 57.6 % (ref 38.4–76.8)
Platelets: 262 10*3/uL (ref 145–400)
RBC: 4.03 10*6/uL (ref 3.70–5.45)
RDW: 19.2 % — ABNORMAL HIGH (ref 11.2–14.5)
WBC: 3.8 10*3/uL — ABNORMAL LOW (ref 3.9–10.3)
lymph#: 1.1 10*3/uL (ref 0.9–3.3)

## 2016-04-13 MED ORDER — SODIUM CHLORIDE 0.9% FLUSH
10.0000 mL | INTRAVENOUS | Status: DC | PRN
  Administered 2016-04-13: 10 mL
  Filled 2016-04-13: qty 10

## 2016-04-13 MED ORDER — SODIUM CHLORIDE 0.9 % IV SOLN
Freq: Once | INTRAVENOUS | Status: AC
  Administered 2016-04-13: 10:00:00 via INTRAVENOUS

## 2016-04-13 MED ORDER — HEPARIN SOD (PORK) LOCK FLUSH 100 UNIT/ML IV SOLN
500.0000 [IU] | Freq: Once | INTRAVENOUS | Status: AC | PRN
  Administered 2016-04-13: 500 [IU]
  Filled 2016-04-13: qty 5

## 2016-04-13 MED ORDER — PROCHLORPERAZINE MALEATE 10 MG PO TABS
10.0000 mg | ORAL_TABLET | Freq: Once | ORAL | Status: AC
  Administered 2016-04-13: 10 mg via ORAL

## 2016-04-13 MED ORDER — PROCHLORPERAZINE MALEATE 10 MG PO TABS
ORAL_TABLET | ORAL | Status: AC
  Filled 2016-04-13: qty 1

## 2016-04-13 MED ORDER — PACLITAXEL PROTEIN-BOUND CHEMO INJECTION 100 MG
50.0000 mg/m2 | Freq: Once | INTRAVENOUS | Status: AC
  Administered 2016-04-13: 75 mg via INTRAVENOUS
  Filled 2016-04-13: qty 15

## 2016-04-13 NOTE — Assessment & Plan Note (Signed)
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 Systemic chemotherapy with dose dense Adriamycin and Cytoxan 4 followed by Abraxane weekly 12 2. Adjuvant radiation therapy followed by 3. Adjuvant antiestrogen therapy -------------------------------------------------------------------------------------------------------------------------- Current Treatment: Completed 4 cycles of Adriamycin and Cytoxan today is Cycle 7/12 Abraxane ECHO: 12/23/15: EF 55-60% Chemotherapy toxicities: 1. Bone discomfort from Neulasta 2. Alopecia 3. Nausea grade 1-2 4. Fatigue grade 1 5. Neutropenia: Decreased the dose of Abraxane from cycle 5  Monitoring closely for toxicities Return to clinic in 2 weeks for cycle 9

## 2016-04-13 NOTE — Progress Notes (Signed)
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 She appears to be tolerating Abraxane fairly well except for neuropathy. We had reviews a dose of chemotherapy with the last cycle. It appears that neuropathy has remained stable. She also did not have any nausea after the last treatment.  REVIEW OF SYSTEMS:   Constitutional: Denies fevers, chills or abnormal weight loss Eyes: Denies blurriness of vision Ears, nose, mouth, throat, and face: Denies mucositis or sore throat Respiratory: Denies cough, dyspnea or wheezes Cardiovascular: Denies palpitation, chest discomfort Gastrointestinal:  Denies nausea, heartburn or change in bowel habits Skin: Denies abnormal skin rashes Lymphatics: Denies new lymphadenopathy or easy bruising Neurological: Neuropathy in the feet Behavioral/Psych: Mood is stable, no new changes  Extremities: No lower extremity edema  All other systems were reviewed with the patient and are negative.  I have reviewed the past medical history, past surgical history, social  history and family history with the patient and they are unchanged from previous note.  ALLERGIES:  has No Known Allergies.  MEDICATIONS:  Current Outpatient Prescriptions  Medication Sig Dispense Refill  . Ascorbic Acid (VITAMIN C) 1000 MG tablet Take 1,000 mg by mouth daily.    . calcium carbonate (OS-CAL) 600 MG TABS Take 600 mg by mouth 2 (two) times daily with a meal.    . cholecalciferol (VITAMIN D) 1000 UNITS tablet Take 1,000 Units by mouth daily.    Marland Kitchen lidocaine-prilocaine (EMLA) cream Apply to affected area once 30 g 3  . ondansetron (ZOFRAN) 8 MG tablet Take 1 tablet (8 mg total) by mouth 2 (two) times daily as needed (Nausea or vomiting). (Patient not taking: Reported on 04/03/2016) 30 tablet 1  . oxyCODONE-acetaminophen (ROXICET) 5-325 MG tablet Take 1-2 tablets by mouth every 4 (four) hours as needed. (Patient not taking: Reported on 04/03/2016) 50 tablet 0  . prochlorperazine (COMPAZINE) 10 MG tablet Take 1 tablet (10 mg total) by mouth every 6 (six) hours as needed (Nausea or vomiting). (Patient not taking: Reported on 04/03/2016) 30 tablet 1  . VITAMIN E COMPLEX PO Take by mouth.     No current facility-administered medications for this visit.    PHYSICAL EXAMINATION: ECOG PERFORMANCE STATUS: 1 - Symptomatic but completely ambulatory  Filed Vitals:   04/13/16 0914  BP: 121/86  Pulse: 88  Temp: 98.4 F (36.9 C)  Resp: 18   Filed Weights   04/13/16 0914  Weight: 130 lb 1.6 oz (59.013 kg)    GENERAL:alert, no distress and comfortable SKIN: skin color, texture, turgor are normal, no rashes or significant  lesions EYES: normal, Conjunctiva are pink and non-injected, sclera clear OROPHARYNX:no exudate, no erythema and lips, buccal mucosa, and tongue normal  NECK: supple, thyroid normal size, non-tender, without nodularity LYMPH:  no palpable lymphadenopathy in the cervical, axillary or inguinal LUNGS: clear to auscultation and percussion with normal breathing  effort HEART: regular rate & rhythm and no murmurs and no lower extremity edema ABDOMEN:abdomen soft, non-tender and normal bowel sounds MUSCULOSKELETAL:no cyanosis of digits and no clubbing  NEURO: alert & oriented x 3 with fluent speech, no focal motor/sensory deficits EXTREMITIES: No lower extremity edema   LABORATORY DATA:  I have reviewed the data as listed   Chemistry      Component Value Date/Time   NA 141 04/05/2016 0856   K 3.6 04/05/2016 0856   CO2 28 04/05/2016 0856   BUN 5.8* 04/05/2016 0856   CREATININE 0.7 04/05/2016 0856      Component Value Date/Time   CALCIUM 9.9 04/05/2016 0856   ALKPHOS 91 04/05/2016 0856   AST 44* 04/05/2016 0856   ALT 35 04/05/2016 0856   BILITOT 0.44 04/05/2016 0856       Lab Results  Component Value Date   WBC 3.8* 04/13/2016   HGB 12.6 04/13/2016   HCT 38.6 04/13/2016   MCV 95.8 04/13/2016   PLT 262 04/13/2016   NEUTROABS 2.2 04/13/2016     ASSESSMENT & PLAN:  Breast cancer of upper-outer quadrant of left female breast ([place]) Lt Lumpectomy 11/22/15: IDC 1.5 cm, 0/2 LN, grade 2, with Int Grade DCIS, ER 100%, PR 30%, Her 2 Neg Ratio 1.91, T1cN0 (stage 1A), Oncotype DX recurrence score 37, 25% 10 year risk of recurrence  Recommendation:  1. Systemic chemotherapy with dose dense Adriamycin and Cytoxan 4 followed by Abraxane weekly 12 2. Adjuvant radiation therapy followed by 3. Adjuvant antiestrogen therapy -------------------------------------------------------------------------------------------------------------------------- Current Treatment: Completed 4 cycles of Adriamycin and Cytoxan today is Cycle 7/12 Abraxane ECHO: 12/23/15: EF 55-60% Chemotherapy toxicities: 1. Bone discomfort from Neulasta 2. Alopecia 3. Nausea grade 1-2 4. Fatigue grade 1 5. Neutropenia: Decreased the dose of Abraxane from cycle 5  Monitoring closely for toxicities Return to clinic in 2 weeks for cycle 9   No orders of the defined types  were placed in this encounter.   The patient has a good understanding of the overall plan. she agrees with it. she will call with any problems that may develop before the next visit here.   Rulon Eisenmenger, MD 04/13/2016

## 2016-04-13 NOTE — Patient Instructions (Signed)
[facility] Discharge Instructions for Patients Receiving Chemotherapy  Today you received the following chemotherapy agents Abraxane To help prevent nausea and vomiting after your treatment, we encourage you to take your nausea medication as prescribed.   If you develop nausea and vomiting that is not controlled by your nausea medication, call the clinic.   BELOW ARE SYMPTOMS THAT SHOULD BE REPORTED IMMEDIATELY:  *FEVER GREATER THAN 100.5 F  *CHILLS WITH OR WITHOUT FEVER  NAUSEA AND VOMITING THAT IS NOT CONTROLLED WITH YOUR NAUSEA MEDICATION  *UNUSUAL SHORTNESS OF BREATH  *UNUSUAL BRUISING OR BLEEDING  TENDERNESS IN MOUTH AND THROAT WITH OR WITHOUT PRESENCE OF ULCERS  *URINARY PROBLEMS  *BOWEL PROBLEMS  UNUSUAL RASH Items with * indicate a potential emergency and should be followed up as soon as possible.  Feel free to call the clinic you have any questions or concerns. The clinic phone number is [phone].  Please show the CHEMO ALERT CARD at check-in to the Emergency Department and triage nurse.   

## 2016-04-19 ENCOUNTER — Other Ambulatory Visit (HOSPITAL_BASED_OUTPATIENT_CLINIC_OR_DEPARTMENT_OTHER): Payer: BLUE CROSS/BLUE SHIELD

## 2016-04-19 ENCOUNTER — Ambulatory Visit (HOSPITAL_BASED_OUTPATIENT_CLINIC_OR_DEPARTMENT_OTHER): Payer: BLUE CROSS/BLUE SHIELD

## 2016-04-19 VITALS — BP 106/66 | HR 85 | Temp 97.8°F | Resp 18

## 2016-04-19 DIAGNOSIS — C50412 Malignant neoplasm of upper-outer quadrant of left female breast: Secondary | ICD-10-CM

## 2016-04-19 DIAGNOSIS — Z5111 Encounter for antineoplastic chemotherapy: Secondary | ICD-10-CM

## 2016-04-19 LAB — CBC WITH DIFFERENTIAL/PLATELET
BASO%: 2.7 % — ABNORMAL HIGH (ref 0.0–2.0)
Basophils Absolute: 0.1 10*3/uL (ref 0.0–0.1)
EOS%: 1.2 % (ref 0.0–7.0)
Eosinophils Absolute: 0 10*3/uL (ref 0.0–0.5)
HCT: 40.7 % (ref 34.8–46.6)
HGB: 13.4 g/dL (ref 11.6–15.9)
LYMPH%: 34.1 % (ref 14.0–49.7)
MCH: 31.6 pg (ref 25.1–34.0)
MCHC: 32.9 g/dL (ref 31.5–36.0)
MCV: 96.1 fL (ref 79.5–101.0)
MONO#: 0.3 10*3/uL (ref 0.1–0.9)
MONO%: 8.8 % (ref 0.0–14.0)
NEUT#: 1.9 10*3/uL (ref 1.5–6.5)
NEUT%: 53.2 % (ref 38.4–76.8)
Platelets: 255 10*3/uL (ref 145–400)
RBC: 4.24 10*6/uL (ref 3.70–5.45)
RDW: 18.9 % — ABNORMAL HIGH (ref 11.2–14.5)
WBC: 3.6 10*3/uL — ABNORMAL LOW (ref 3.9–10.3)
lymph#: 1.2 10*3/uL (ref 0.9–3.3)

## 2016-04-19 LAB — COMPREHENSIVE METABOLIC PANEL
ALT: 51 U/L (ref 0–55)
AST: 45 U/L — ABNORMAL HIGH (ref 5–34)
Albumin: 3.9 g/dL (ref 3.5–5.0)
Alkaline Phosphatase: 108 U/L (ref 40–150)
Anion Gap: 7 mEq/L (ref 3–11)
BUN: 9.9 mg/dL (ref 7.0–26.0)
CO2: 28 mEq/L (ref 22–29)
Calcium: 9.9 mg/dL (ref 8.4–10.4)
Chloride: 105 mEq/L (ref 98–109)
Creatinine: 0.7 mg/dL (ref 0.6–1.1)
EGFR: >90 mL/min/{1.73_m2} (ref >90)
Glucose: 74 mg/dl (ref 70–140)
Potassium: 4.3 mEq/L (ref 3.5–5.1)
Sodium: 140 mEq/L (ref 136–145)
Total Bilirubin: 0.47 mg/dL (ref 0.20–1.20)
Total Protein: 7.5 g/dL (ref 6.4–8.3)

## 2016-04-19 MED ORDER — PACLITAXEL PROTEIN-BOUND CHEMO INJECTION 100 MG
50.0000 mg/m2 | Freq: Once | INTRAVENOUS | Status: AC
Start: 2016-04-19 — End: 2016-04-19
  Administered 2016-04-19: 75 mg via INTRAVENOUS
  Filled 2016-04-19: qty 15

## 2016-04-19 MED ORDER — SODIUM CHLORIDE 0.9% FLUSH
10.0000 mL | INTRAVENOUS | Status: DC | PRN
  Administered 2016-04-19: 10 mL
  Filled 2016-04-19: qty 10

## 2016-04-19 MED ORDER — SODIUM CHLORIDE 0.9 % IV SOLN
Freq: Once | INTRAVENOUS | Status: AC
  Administered 2016-04-19: 11:00:00 via INTRAVENOUS

## 2016-04-19 MED ORDER — PROCHLORPERAZINE MALEATE 10 MG PO TABS
ORAL_TABLET | ORAL | Status: AC
  Filled 2016-04-19: qty 1

## 2016-04-19 MED ORDER — HEPARIN SOD (PORK) LOCK FLUSH 100 UNIT/ML IV SOLN
500.0000 [IU] | Freq: Once | INTRAVENOUS | Status: AC | PRN
  Administered 2016-04-19: 500 [IU]
  Filled 2016-04-19: qty 5

## 2016-04-19 MED ORDER — PROCHLORPERAZINE MALEATE 10 MG PO TABS
10.0000 mg | ORAL_TABLET | Freq: Once | ORAL | Status: AC
  Administered 2016-04-19: 10 mg via ORAL

## 2016-04-19 MED ORDER — IBUPROFEN 200 MG PO TABS
200.0000 mg | ORAL_TABLET | Freq: Once | ORAL | Status: AC
  Administered 2016-04-19: 200 mg via ORAL

## 2016-04-19 NOTE — Progress Notes (Signed)
Patient complains of generalized lower back pain x 2 weeks. Patient states the pain becomes more aggravated during movement. Patient does not remember injuring her back. Patient states she takes Advil at home which relieves the pain. Patient states she didn't take Advil this morning. Selena Lesser, NP notified. Order given for 200 mg Ibuprofen per Selena Lesser, NP.

## 2016-04-19 NOTE — Patient Instructions (Signed)
[facility] Discharge Instructions for Patients Receiving Chemotherapy  Today you received the following chemotherapy agents Abraxane To help prevent nausea and vomiting after your treatment, we encourage you to take your nausea medication as prescribed.   If you develop nausea and vomiting that is not controlled by your nausea medication, call the clinic.   BELOW ARE SYMPTOMS THAT SHOULD BE REPORTED IMMEDIATELY:  *FEVER GREATER THAN 100.5 F  *CHILLS WITH OR WITHOUT FEVER  NAUSEA AND VOMITING THAT IS NOT CONTROLLED WITH YOUR NAUSEA MEDICATION  *UNUSUAL SHORTNESS OF BREATH  *UNUSUAL BRUISING OR BLEEDING  TENDERNESS IN MOUTH AND THROAT WITH OR WITHOUT PRESENCE OF ULCERS  *URINARY PROBLEMS  *BOWEL PROBLEMS  UNUSUAL RASH Items with * indicate a potential emergency and should be followed up as soon as possible.  Feel free to call the clinic you have any questions or concerns. The clinic phone number is [phone].  Please show the CHEMO ALERT CARD at check-in to the Emergency Department and triage nurse.   

## 2016-04-26 ENCOUNTER — Ambulatory Visit (HOSPITAL_BASED_OUTPATIENT_CLINIC_OR_DEPARTMENT_OTHER): Payer: BLUE CROSS/BLUE SHIELD | Admitting: Hematology and Oncology

## 2016-04-26 ENCOUNTER — Encounter: Payer: Self-pay | Admitting: Hematology and Oncology

## 2016-04-26 ENCOUNTER — Other Ambulatory Visit (HOSPITAL_BASED_OUTPATIENT_CLINIC_OR_DEPARTMENT_OTHER): Payer: BLUE CROSS/BLUE SHIELD

## 2016-04-26 ENCOUNTER — Ambulatory Visit: Payer: BLUE CROSS/BLUE SHIELD

## 2016-04-26 ENCOUNTER — Ambulatory Visit (HOSPITAL_BASED_OUTPATIENT_CLINIC_OR_DEPARTMENT_OTHER): Payer: BLUE CROSS/BLUE SHIELD

## 2016-04-26 VITALS — BP 127/80 | HR 72 | Temp 98.3°F | Resp 18 | Ht 59.0 in | Wt 130.1 lb

## 2016-04-26 DIAGNOSIS — Z5111 Encounter for antineoplastic chemotherapy: Secondary | ICD-10-CM | POA: Diagnosis not present

## 2016-04-26 DIAGNOSIS — C50412 Malignant neoplasm of upper-outer quadrant of left female breast: Secondary | ICD-10-CM

## 2016-04-26 LAB — CBC WITH DIFFERENTIAL/PLATELET
BASO%: 1.8 % (ref 0.0–2.0)
Basophils Absolute: 0.1 10*3/uL (ref 0.0–0.1)
EOS%: 1.8 % (ref 0.0–7.0)
Eosinophils Absolute: 0.1 10*3/uL (ref 0.0–0.5)
HCT: 36.6 % (ref 34.8–46.6)
HGB: 12.4 g/dL (ref 11.6–15.9)
LYMPH%: 38.6 % (ref 14.0–49.7)
MCH: 31.9 pg (ref 25.1–34.0)
MCHC: 33.9 g/dL (ref 31.5–36.0)
MCV: 94.1 fL (ref 79.5–101.0)
MONO#: 0.3 10*3/uL (ref 0.1–0.9)
MONO%: 9.3 % (ref 0.0–14.0)
NEUT#: 1.6 10*3/uL (ref 1.5–6.5)
NEUT%: 48.5 % (ref 38.4–76.8)
Platelets: 295 10*3/uL (ref 145–400)
RBC: 3.89 10*6/uL (ref 3.70–5.45)
RDW: 17.1 % — ABNORMAL HIGH (ref 11.2–14.5)
WBC: 3.3 10*3/uL — ABNORMAL LOW (ref 3.9–10.3)
lymph#: 1.3 10*3/uL (ref 0.9–3.3)

## 2016-04-26 LAB — COMPREHENSIVE METABOLIC PANEL
ALT: 40 U/L (ref 0–55)
AST: 38 U/L — ABNORMAL HIGH (ref 5–34)
Albumin: 3.8 g/dL (ref 3.5–5.0)
Alkaline Phosphatase: 115 U/L (ref 40–150)
Anion Gap: 7 mEq/L (ref 3–11)
BUN: 10 mg/dL (ref 7.0–26.0)
CO2: 30 mEq/L — ABNORMAL HIGH (ref 22–29)
Calcium: 9.9 mg/dL (ref 8.4–10.4)
Chloride: 103 mEq/L (ref 98–109)
Creatinine: 0.8 mg/dL (ref 0.6–1.1)
EGFR: 85 mL/min/{1.73_m2} — ABNORMAL LOW (ref >90)
Glucose: 82 mg/dl (ref 70–140)
Potassium: 4.2 mEq/L (ref 3.5–5.1)
Sodium: 140 mEq/L (ref 136–145)
Total Bilirubin: <0.3 mg/dL (ref 0.20–1.20)
Total Protein: 7.1 g/dL (ref 6.4–8.3)

## 2016-04-26 MED ORDER — SODIUM CHLORIDE 0.9% FLUSH
10.0000 mL | INTRAVENOUS | Status: DC | PRN
  Administered 2016-04-26: 10 mL
  Filled 2016-04-26: qty 10

## 2016-04-26 MED ORDER — PROCHLORPERAZINE MALEATE 10 MG PO TABS
10.0000 mg | ORAL_TABLET | Freq: Once | ORAL | Status: AC
Start: 2016-04-26 — End: 2016-04-26
  Administered 2016-04-26: 10 mg via ORAL

## 2016-04-26 MED ORDER — SODIUM CHLORIDE 0.9 % IV SOLN
Freq: Once | INTRAVENOUS | Status: AC
  Administered 2016-04-26: 16:00:00 via INTRAVENOUS

## 2016-04-26 MED ORDER — HEPARIN SOD (PORK) LOCK FLUSH 100 UNIT/ML IV SOLN
500.0000 [IU] | Freq: Once | INTRAVENOUS | Status: AC | PRN
Start: 2016-04-26 — End: 2016-04-26
  Administered 2016-04-26: 500 [IU]
  Filled 2016-04-26: qty 5

## 2016-04-26 MED ORDER — PROCHLORPERAZINE MALEATE 10 MG PO TABS
ORAL_TABLET | ORAL | Status: AC
  Filled 2016-04-26: qty 1

## 2016-04-26 MED ORDER — PACLITAXEL PROTEIN-BOUND CHEMO INJECTION 100 MG
50.0000 mg/m2 | Freq: Once | INTRAVENOUS | Status: AC
  Administered 2016-04-26: 75 mg via INTRAVENOUS
  Filled 2016-04-26: qty 15

## 2016-04-26 NOTE — Patient Instructions (Signed)
[facility] Discharge Instructions for Patients Receiving Chemotherapy  Today you received the following chemotherapy agents Abraxane To help prevent nausea and vomiting after your treatment, we encourage you to take your nausea medication as prescribed.   If you develop nausea and vomiting that is not controlled by your nausea medication, call the clinic.   BELOW ARE SYMPTOMS THAT SHOULD BE REPORTED IMMEDIATELY:  *FEVER GREATER THAN 100.5 F  *CHILLS WITH OR WITHOUT FEVER  NAUSEA AND VOMITING THAT IS NOT CONTROLLED WITH YOUR NAUSEA MEDICATION  *UNUSUAL SHORTNESS OF BREATH  *UNUSUAL BRUISING OR BLEEDING  TENDERNESS IN MOUTH AND THROAT WITH OR WITHOUT PRESENCE OF ULCERS  *URINARY PROBLEMS  *BOWEL PROBLEMS  UNUSUAL RASH Items with * indicate a potential emergency and should be followed up as soon as possible.  Feel free to call the clinic you have any questions or concerns. The clinic phone number is [phone].  Please show the CHEMO ALERT CARD at check-in to the Emergency Department and triage nurse.   

## 2016-04-26 NOTE — Progress Notes (Signed)
Patient Care Team: Pcp Not In System as PCP - General  SUMMARY OF ONCOLOGIC HISTORY:   Breast cancer of upper-outer quadrant of left female breast ([place])   10/19/2015 Mammogram Screening mammogram revealed left breast asymmetry 1.1 x 0.8 x 1 cm, T1 cN0 stage IA clinical stage   11/03/2015 Initial Diagnosis Left breast biopsy 11:30 position: Invasive ductal carcinoma with DCIS, grade 2, ER 100%, PR 30%, Ki-67 90%, HER-2 negative ratio 1.44   11/22/2015 Surgery Lt Lumpectomy: IDC 1.5 cm, 0/2 LN, grade 2, with Int Grade DCIS, ER 100%, PR 30%, Her 2 Neg Ratio 1.91, T1cN0 (stage 1A)Oncotype DX score 37, 10 year ROR 25%   12/29/2015 -  Chemotherapy Adjuvant chemotherapy with dose dense Adriamycin Cytoxan 4 followed by Abraxane weekly 12    CHIEF COMPLIANT: Cycle 10 Abraxane  INTERVAL HISTORY: Heather [name] is a 60 year old above-mentioned history of left breast cancer currently on adjuvant chemotherapy with Abraxane. She is tolerating it fairly well. 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 She does not have any nausea vomiting or fatigue or lightheadedness or dizziness. Denies any fevers or chills.  REVIEW OF SYSTEMS:   Constitutional: Denies fevers, chills or abnormal weight loss Eyes: Denies blurriness of vision Ears, nose, mouth, throat, and face: Denies mucositis or sore throat Respiratory: Denies cough, dyspnea or wheezes Cardiovascular: Denies palpitation, chest discomfort Gastrointestinal:  Denies nausea, heartburn or change in bowel habits Skin: Denies abnormal skin rashes Lymphatics: Denies new lymphadenopathy or easy bruising Neurological:Denies numbness, tingling or new weaknesses Behavioral/Psych: Mood is stable, no new changes  Extremities: No lower extremity edema Breast:  denies any pain or lumps or nodules in either breasts All other systems were reviewed with the patient and are negative.  I have reviewed the  past medical history, past surgical history, social history and family history with the patient and they are unchanged from previous note.  ALLERGIES:  has No Known Allergies.  MEDICATIONS:  Current Outpatient Prescriptions  Medication Sig Dispense Refill  . Ascorbic Acid (VITAMIN C) 1000 MG tablet Take 1,000 mg by mouth daily.    . calcium carbonate (OS-CAL) 600 MG TABS Take 600 mg by mouth 2 (two) times daily with a meal.    . cholecalciferol (VITAMIN D) 1000 UNITS tablet Take 1,000 Units by mouth daily.    Marland Kitchen lidocaine-prilocaine (EMLA) cream Apply to affected area once 30 g 3  . ondansetron (ZOFRAN) 8 MG tablet Take 1 tablet (8 mg total) by mouth 2 (two) times daily as needed (Nausea or vomiting). (Patient not taking: Reported on 04/03/2016) 30 tablet 1  . oxyCODONE-acetaminophen (ROXICET) 5-325 MG tablet Take 1-2 tablets by mouth every 4 (four) hours as needed. (Patient not taking: Reported on 04/03/2016) 50 tablet 0  . prochlorperazine (COMPAZINE) 10 MG tablet Take 1 tablet (10 mg total) by mouth every 6 (six) hours as needed (Nausea or vomiting). (Patient not taking: Reported on 04/03/2016) 30 tablet 1  . VITAMIN E COMPLEX PO Take by mouth.     No current facility-administered medications for this visit.    PHYSICAL EXAMINATION: ECOG PERFORMANCE STATUS: 1 - Symptomatic but completely ambulatory  Filed Vitals:   04/26/16 1531  BP: 127/80  Pulse: 72  Temp: 98.3 F (36.8 C)  Resp: 18   Filed Weights   04/26/16 1531  Weight: 130 lb 1.6 oz (59.013 kg)    GENERAL:alert, no distress and comfortable SKIN: skin color, texture, turgor  are normal, no rashes or significant lesions EYES: normal, Conjunctiva are pink and non-injected, sclera clear OROPHARYNX:no exudate, no erythema and lips, buccal mucosa, and tongue normal  NECK: supple, thyroid normal size, non-tender, without nodularity LYMPH:  no palpable lymphadenopathy in the cervical, axillary or inguinal LUNGS: clear to  auscultation and percussion with normal breathing effort HEART: regular rate & rhythm and no murmurs and no lower extremity edema ABDOMEN:abdomen soft, non-tender and normal bowel sounds MUSCULOSKELETAL:no cyanosis of digits and no clubbing  NEURO: alert & oriented x 3 with fluent speech, no focal motor/sensory deficits EXTREMITIES: No lower extremity edema  LABORATORY DATA:  I have reviewed the data as listed   Chemistry      Component Value Date/Time   NA 140 04/19/2016 0947   K 4.3 04/19/2016 0947   CO2 28 04/19/2016 0947   BUN 9.9 04/19/2016 0947   CREATININE 0.7 04/19/2016 0947      Component Value Date/Time   CALCIUM 9.9 04/19/2016 0947   ALKPHOS 108 04/19/2016 0947   AST 45* 04/19/2016 0947   ALT 51 04/19/2016 0947   BILITOT 0.47 04/19/2016 0947       Lab Results  Component Value Date   WBC 3.3* 04/26/2016   HGB 12.4 04/26/2016   HCT 36.6 04/26/2016   MCV 94.1 04/26/2016   PLT 295 04/26/2016   NEUTROABS 1.6 04/26/2016     ASSESSMENT & PLAN:  Breast cancer of upper-outer quadrant of left female breast ([place]) Lt Lumpectomy 11/22/15: IDC 1.5 cm, 0/2 LN, grade 2, with Int Grade DCIS, ER 100%, PR 30%, Her 2 Neg Ratio 1.91, T1cN0 (stage 1A), Oncotype DX recurrence score 37, 25% 10 year risk of recurrence  Recommendation:  1. Systemic chemotherapy with dose dense Adriamycin and Cytoxan 4 followed by Abraxane weekly 12 2. Adjuvant radiation therapy followed by 3. Adjuvant antiestrogen therapy -------------------------------------------------------------------------------------------------------------------------- Current Treatment: Completed 4 cycles of Adriamycin and Cytoxan today is Cycle 10/12 Abraxane ECHO: 12/23/15: EF 55-60% Chemotherapy toxicities: 1. Bone discomfort from Neulasta 2. Alopecia 3. Nausea grade 1-2 4. Fatigue grade 1 5. Neutropenia: Decreased the dose of Abraxane from cycle 5  Monitoring closely for toxicities Return to clinic in 2 weeks  for cycle 12 I will request radiation oncology reconsultation to do adjuvant radiation after completion of chemotherapy.  No orders of the defined types were placed in this encounter.   The patient has a good understanding of the overall plan. she agrees with it. she will call with any problems that may develop before the next visit here.   Rulon Eisenmenger, MD 04/26/2016

## 2016-04-26 NOTE — Assessment & Plan Note (Signed)
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 Systemic chemotherapy with dose dense Adriamycin and Cytoxan 4 followed by Abraxane weekly 12 2. Adjuvant radiation therapy followed by 3. Adjuvant antiestrogen therapy -------------------------------------------------------------------------------------------------------------------------- Current Treatment: Completed 4 cycles of Adriamycin and Cytoxan today is Cycle 9/12 Abraxane ECHO: 12/23/15: EF 55-60% Chemotherapy toxicities: 1. Bone discomfort from Neulasta 2. Alopecia 3. Nausea grade 1-2 4. Fatigue grade 1 5. Neutropenia: Decreased the dose of Abraxane from cycle 5  Monitoring closely for toxicities Return to clinic in 2 weeks for cycle 11 I will request radiation oncology reconsultation to do adjuvant radiation after completion of chemotherapy. Return to clinic after radiation is complete.

## 2016-04-27 ENCOUNTER — Other Ambulatory Visit: Payer: Self-pay | Admitting: *Deleted

## 2016-05-03 ENCOUNTER — Ambulatory Visit (HOSPITAL_BASED_OUTPATIENT_CLINIC_OR_DEPARTMENT_OTHER): Payer: BLUE CROSS/BLUE SHIELD

## 2016-05-03 ENCOUNTER — Other Ambulatory Visit (HOSPITAL_BASED_OUTPATIENT_CLINIC_OR_DEPARTMENT_OTHER): Payer: BLUE CROSS/BLUE SHIELD

## 2016-05-03 VITALS — BP 124/72 | HR 85 | Temp 98.2°F | Resp 18

## 2016-05-03 DIAGNOSIS — C50412 Malignant neoplasm of upper-outer quadrant of left female breast: Secondary | ICD-10-CM | POA: Diagnosis not present

## 2016-05-03 DIAGNOSIS — Z5111 Encounter for antineoplastic chemotherapy: Secondary | ICD-10-CM | POA: Diagnosis not present

## 2016-05-03 LAB — CBC WITH DIFFERENTIAL/PLATELET
BASO%: 2.1 % — ABNORMAL HIGH (ref 0.0–2.0)
Basophils Absolute: 0.1 10*3/uL (ref 0.0–0.1)
EOS%: 1.3 % (ref 0.0–7.0)
Eosinophils Absolute: 0 10*3/uL (ref 0.0–0.5)
HCT: 39.6 % (ref 34.8–46.6)
HGB: 13 g/dL (ref 11.6–15.9)
LYMPH%: 32 % (ref 14.0–49.7)
MCH: 31.4 pg (ref 25.1–34.0)
MCHC: 32.7 g/dL (ref 31.5–36.0)
MCV: 96 fL (ref 79.5–101.0)
MONO#: 0.3 10*3/uL (ref 0.1–0.9)
MONO%: 7.8 % (ref 0.0–14.0)
NEUT#: 2.1 10*3/uL (ref 1.5–6.5)
NEUT%: 56.8 % (ref 38.4–76.8)
Platelets: 271 10*3/uL (ref 145–400)
RBC: 4.12 10*6/uL (ref 3.70–5.45)
RDW: 17.8 % — ABNORMAL HIGH (ref 11.2–14.5)
WBC: 3.7 10*3/uL — ABNORMAL LOW (ref 3.9–10.3)
lymph#: 1.2 10*3/uL (ref 0.9–3.3)

## 2016-05-03 LAB — COMPREHENSIVE METABOLIC PANEL
ALT: 37 U/L (ref 0–55)
AST: 41 U/L — ABNORMAL HIGH (ref 5–34)
Albumin: 3.8 g/dL (ref 3.5–5.0)
Alkaline Phosphatase: 106 U/L (ref 40–150)
Anion Gap: 8 mEq/L (ref 3–11)
BUN: 8.3 mg/dL (ref 7.0–26.0)
CO2: 27 mEq/L (ref 22–29)
Calcium: 10 mg/dL (ref 8.4–10.4)
Chloride: 106 mEq/L (ref 98–109)
Creatinine: 0.7 mg/dL (ref 0.6–1.1)
EGFR: >90 mL/min/{1.73_m2} (ref >90)
Glucose: 85 mg/dl (ref 70–140)
Potassium: 4.7 mEq/L (ref 3.5–5.1)
Sodium: 142 mEq/L (ref 136–145)
Total Bilirubin: <0.3 mg/dL (ref 0.20–1.20)
Total Protein: 7.3 g/dL (ref 6.4–8.3)

## 2016-05-03 MED ORDER — HEPARIN SOD (PORK) LOCK FLUSH 100 UNIT/ML IV SOLN
500.0000 [IU] | Freq: Once | INTRAVENOUS | Status: AC | PRN
  Administered 2016-05-03: 500 [IU]
  Filled 2016-05-03: qty 5

## 2016-05-03 MED ORDER — PROCHLORPERAZINE MALEATE 10 MG PO TABS
10.0000 mg | ORAL_TABLET | Freq: Once | ORAL | Status: AC
  Administered 2016-05-03: 10 mg via ORAL

## 2016-05-03 MED ORDER — PACLITAXEL PROTEIN-BOUND CHEMO INJECTION 100 MG
50.0000 mg/m2 | Freq: Once | INTRAVENOUS | Status: AC
  Administered 2016-05-03: 75 mg via INTRAVENOUS
  Filled 2016-05-03: qty 15

## 2016-05-03 MED ORDER — PROCHLORPERAZINE MALEATE 10 MG PO TABS
ORAL_TABLET | ORAL | Status: AC
  Filled 2016-05-03: qty 1

## 2016-05-03 MED ORDER — SODIUM CHLORIDE 0.9% FLUSH
10.0000 mL | INTRAVENOUS | Status: DC | PRN
  Administered 2016-05-03: 10 mL
  Filled 2016-05-03: qty 10

## 2016-05-03 MED ORDER — SODIUM CHLORIDE 0.9 % IV SOLN
Freq: Once | INTRAVENOUS | Status: AC
  Administered 2016-05-03: 12:00:00 via INTRAVENOUS

## 2016-05-03 NOTE — Patient Instructions (Signed)
[facility] Discharge Instructions for Patients Receiving Chemotherapy  Today you received the following chemotherapy agents Abraxane  To help prevent nausea and vomiting after your treatment, we encourage you to take your nausea medication    If you develop nausea and vomiting that is not controlled by your nausea medication, call the clinic.   BELOW ARE SYMPTOMS THAT SHOULD BE REPORTED IMMEDIATELY:  *FEVER GREATER THAN 100.5 F  *CHILLS WITH OR WITHOUT FEVER  NAUSEA AND VOMITING THAT IS NOT CONTROLLED WITH YOUR NAUSEA MEDICATION  *UNUSUAL SHORTNESS OF BREATH  *UNUSUAL BRUISING OR BLEEDING  TENDERNESS IN MOUTH AND THROAT WITH OR WITHOUT PRESENCE OF ULCERS  *URINARY PROBLEMS  *BOWEL PROBLEMS  UNUSUAL RASH Items with * indicate a potential emergency and should be followed up as soon as possible.  Feel free to call the clinic you have any questions or concerns. The clinic phone number is [phone].  Please show the CHEMO ALERT CARD at check-in to the Emergency Department and triage nurse.   

## 2016-05-10 ENCOUNTER — Telehealth: Payer: Self-pay | Admitting: Hematology and Oncology

## 2016-05-10 ENCOUNTER — Ambulatory Visit (HOSPITAL_BASED_OUTPATIENT_CLINIC_OR_DEPARTMENT_OTHER): Payer: BLUE CROSS/BLUE SHIELD | Admitting: Hematology and Oncology

## 2016-05-10 ENCOUNTER — Other Ambulatory Visit (HOSPITAL_BASED_OUTPATIENT_CLINIC_OR_DEPARTMENT_OTHER): Payer: BLUE CROSS/BLUE SHIELD

## 2016-05-10 ENCOUNTER — Encounter: Payer: Self-pay | Admitting: Hematology and Oncology

## 2016-05-10 ENCOUNTER — Ambulatory Visit: Payer: BLUE CROSS/BLUE SHIELD

## 2016-05-10 VITALS — BP 109/73 | HR 82 | Temp 98.0°F | Resp 18 | Wt 129.9 lb

## 2016-05-10 DIAGNOSIS — C50412 Malignant neoplasm of upper-outer quadrant of left female breast: Secondary | ICD-10-CM

## 2016-05-10 DIAGNOSIS — G622 Polyneuropathy due to other toxic agents: Secondary | ICD-10-CM

## 2016-05-10 DIAGNOSIS — R5383 Other fatigue: Secondary | ICD-10-CM

## 2016-05-10 LAB — COMPREHENSIVE METABOLIC PANEL
ALT: 31 U/L (ref 0–55)
AST: 35 U/L — ABNORMAL HIGH (ref 5–34)
Albumin: 3.8 g/dL (ref 3.5–5.0)
Alkaline Phosphatase: 100 U/L (ref 40–150)
Anion Gap: 10 mEq/L (ref 3–11)
BUN: 11.8 mg/dL (ref 7.0–26.0)
CO2: 24 mEq/L (ref 22–29)
Calcium: 9.6 mg/dL (ref 8.4–10.4)
Chloride: 106 mEq/L (ref 98–109)
Creatinine: 0.7 mg/dL (ref 0.6–1.1)
EGFR: >90 mL/min/{1.73_m2} (ref >90)
Glucose: 98 mg/dl (ref 70–140)
Potassium: 3.9 mEq/L (ref 3.5–5.1)
Sodium: 140 mEq/L (ref 136–145)
Total Bilirubin: <0.3 mg/dL (ref 0.20–1.20)
Total Protein: 7.1 g/dL (ref 6.4–8.3)

## 2016-05-10 LAB — CBC WITH DIFFERENTIAL/PLATELET
BASO%: 1.5 % (ref 0.0–2.0)
Basophils Absolute: 0 10*3/uL (ref 0.0–0.1)
EOS%: 0.9 % (ref 0.0–7.0)
Eosinophils Absolute: 0 10*3/uL (ref 0.0–0.5)
HCT: 38.9 % (ref 34.8–46.6)
HGB: 12.7 g/dL (ref 11.6–15.9)
LYMPH%: 33.6 % (ref 14.0–49.7)
MCH: 31.1 pg (ref 25.1–34.0)
MCHC: 32.7 g/dL (ref 31.5–36.0)
MCV: 95.2 fL (ref 79.5–101.0)
MONO#: 0.3 10*3/uL (ref 0.1–0.9)
MONO%: 8.5 % (ref 0.0–14.0)
NEUT#: 1.9 10*3/uL (ref 1.5–6.5)
NEUT%: 55.5 % (ref 38.4–76.8)
Platelets: 269 10*3/uL (ref 145–400)
RBC: 4.09 10*6/uL (ref 3.70–5.45)
RDW: 17.3 % — ABNORMAL HIGH (ref 11.2–14.5)
WBC: 3.4 10*3/uL — ABNORMAL LOW (ref 3.9–10.3)
lymph#: 1.1 10*3/uL (ref 0.9–3.3)

## 2016-05-10 NOTE — Assessment & Plan Note (Signed)
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 Systemic chemotherapy with dose dense Adriamycin and Cytoxan 4 followed by Abraxane weekly 12 2. Adjuvant radiation therapy followed by 3. Adjuvant antiestrogen therapy -------------------------------------------------------------------------------------------------------------------------- Current Treatment: Completed 4 cycles of Adriamycin and Cytoxan completed Cycle 10/12 Abraxane (being discontinued because of neuropathy) ECHO: 12/23/15: EF 55-60%  Chemotherapy toxicities: 1. Bone discomfort from Neulasta 2. Alopecia 3. Nausea grade 1-2 4. Fatigue grade 1 5. Neutropenia: Decreased the dose of Abraxane from cycle 5  Monitoring closely for toxicities I requested Dr. Marlou Starks to remove the port. Return to clinic after radiation therapy is complete in 3 months.

## 2016-05-10 NOTE — Telephone Encounter (Signed)
appt made and avs printed °

## 2016-05-10 NOTE — Progress Notes (Signed)
Patient Care Team: Pcp Not In System as PCP - General  DIAGNOSIS: No matching staging information was found for the patient.  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%, Ki-67 90%, HER-2 negative ratio 1.44   11/22/2015 Surgery Lt Lumpectomy: IDC 1.5 cm, 0/2 LN, grade 2, with Int Grade DCIS, ER 100%, PR 30%, Her 2 Neg Ratio 1.91, T1cN0 (stage 1A)Oncotype DX score 37, 10 year ROR 25%   12/29/2015 - 05/10/2016 Chemotherapy Adjuvant chemotherapy with dose dense Adriamycin Cytoxan 4 followed by Abraxane weekly 10 stopped early due to neuropathy    CHIEF COMPLIANT: Stopping chemotherapy for neuropathy  INTERVAL HISTORY: Heather [name] is a 60 year old with above-mentioned history of left breast cancer treated with lumpectomy and currently on adjuvant chemotherapy. Today supposed to be cycle 11 of Abraxane. She has noticed increasing neuropathy in her fingers and toes. The toes are much worse in the fingers. Denies any nausea or vomiting. Does have moderate fatigue.  REVIEW OF SYSTEMS:   Constitutional: Denies fevers, chills or abnormal weight loss Eyes: Denies blurriness of vision Ears, nose, mouth, throat, and face: Denies mucositis or sore throat Respiratory: Denies cough, dyspnea or wheezes Cardiovascular: Denies palpitation, chest discomfort Gastrointestinal:  Denies nausea, heartburn or change in bowel habits Skin: Denies abnormal skin rashes Lymphatics: Denies new lymphadenopathy or easy bruising Neurological: Neuropathy in the toes and fingers Behavioral/Psych: Mood is stable, no new changes  Extremities: No lower extremity edema Breast:  denies any pain or lumps or nodules in either  breasts All other systems were reviewed with the patient and are negative.  I have reviewed the past medical history, past surgical history, social history and family history with the patient and they are unchanged from previous note.  ALLERGIES:  has No Known Allergies.  MEDICATIONS:  Current Outpatient Prescriptions  Medication Sig Dispense Refill  . Ascorbic Acid (VITAMIN C) 1000 MG tablet Take 1,000 mg by mouth daily.    . calcium carbonate (OS-CAL) 600 MG TABS Take 600 mg by mouth 2 (two) times daily with a meal.    . cholecalciferol (VITAMIN D) 1000 UNITS tablet Take 1,000 Units by mouth daily.    Marland Kitchen lidocaine-prilocaine (EMLA) cream Apply to affected area once 30 g 3  . ondansetron (ZOFRAN) 8 MG tablet Take 1 tablet (8 mg total) by mouth 2 (two) times daily as needed (Nausea or vomiting). (Patient not taking: Reported on 04/03/2016) 30 tablet 1  . oxyCODONE-acetaminophen (ROXICET) 5-325 MG tablet Take 1-2 tablets by mouth every 4 (four) hours as needed. (Patient not taking: Reported on 04/03/2016) 50 tablet 0  . prochlorperazine (COMPAZINE) 10 MG tablet Take 1 tablet (10 mg total) by mouth every 6 (six) hours as needed (Nausea or vomiting). (Patient not taking: Reported on 04/03/2016) 30 tablet 1  . VITAMIN E COMPLEX PO Take by mouth.     No current facility-administered medications for this visit.    PHYSICAL EXAMINATION: ECOG PERFORMANCE STATUS: 1 - Symptomatic but completely ambulatory  Filed Vitals:   05/10/16 1426  BP: 109/73  Pulse: 82  Temp: 98 F (36.7 C)  Resp: 18   Filed Weights   05/10/16 1426  Weight: 129 lb 14.4 oz (58.922  kg)    GENERAL:alert, no distress and comfortable SKIN: skin color, texture, turgor are normal, no rashes or significant lesions EYES: normal, Conjunctiva are pink and non-injected, sclera clear OROPHARYNX:no exudate, no erythema and lips, buccal mucosa, and tongue normal  NECK: supple, thyroid normal size, non-tender, without  nodularity LYMPH:  no palpable lymphadenopathy in the cervical, axillary or inguinal LUNGS: clear to auscultation and percussion with normal breathing effort HEART: regular rate & rhythm and no murmurs and no lower extremity edema ABDOMEN:abdomen soft, non-tender and normal bowel sounds MUSCULOSKELETAL:no cyanosis of digits and no clubbing  NEURO: alert & oriented x 3 with fluent speech, Peripheral neuropathy grade 2 EXTREMITIES: No lower extremity edema  LABORATORY DATA:  I have reviewed the data as listed   Chemistry      Component Value Date/Time   NA 140 05/10/2016 1416   K 3.9 05/10/2016 1416   CO2 24 05/10/2016 1416   BUN 11.8 05/10/2016 1416   CREATININE 0.7 05/10/2016 1416      Component Value Date/Time   CALCIUM 9.6 05/10/2016 1416   ALKPHOS 100 05/10/2016 1416   AST 35* 05/10/2016 1416   ALT 31 05/10/2016 1416   BILITOT <0.30 05/10/2016 1416       Lab Results  Component Value Date   WBC 3.4* 05/10/2016   HGB 12.7 05/10/2016   HCT 38.9 05/10/2016   MCV 95.2 05/10/2016   PLT 269 05/10/2016   NEUTROABS 1.9 05/10/2016     ASSESSMENT & PLAN:  Breast cancer of upper-outer quadrant of left female breast ([place]) Lt Lumpectomy 11/22/15: IDC 1.5 cm, 0/2 LN, grade 2, with Int Grade DCIS, ER 100%, PR 30%, Her 2 Neg Ratio 1.91, T1cN0 (stage 1A), Oncotype DX recurrence score 37, 25% 10 year risk of recurrence  Recommendation:  1. Systemic chemotherapy with dose dense Adriamycin and Cytoxan 4 followed by Abraxane weekly 12 2. Adjuvant radiation therapy followed by 3. Adjuvant antiestrogen therapy -------------------------------------------------------------------------------------------------------------------------- Current Treatment: Completed 4 cycles of Adriamycin and Cytoxan completed Cycle 10/12 Abraxane (being discontinued because of neuropathy) ECHO: 12/23/15: EF 55-60%  Chemotherapy toxicities: 1. Bone discomfort from Neulasta 2. Alopecia 3. Nausea grade  1-2 4. Fatigue grade 1 5. Neutropenia: Decreased the dose of Abraxane from cycle 5 6. Peripheral neuropathy grade 2 : I recommended discontinuation of further chemotherapy because of the neuropathy.   Monitoring closely for toxicities I requested Dr. Marlou Starks to remove the port. Return to clinic after radiation therapy is complete in 3 months.    Orders Placed This Encounter  Procedures  . CBC with Differential    Standing Status: Future     Number of Occurrences:      Standing Expiration Date: 05/10/2017  . Comprehensive metabolic panel    Standing Status: Future     Number of Occurrences:      Standing Expiration Date: 05/10/2017  . ABO/RH    Standing Status: Future     Number of Occurrences:      Standing Expiration Date: 05/10/2017   The patient has a good understanding of the overall plan. she agrees with it. she will call with any problems that may develop before the next visit here.   Rulon Eisenmenger, MD 05/10/2016

## 2016-05-11 ENCOUNTER — Telehealth: Payer: Self-pay | Admitting: *Deleted

## 2016-05-11 NOTE — Telephone Encounter (Signed)
  Oncology Nurse Navigator Documentation  Navigator Location: [facility]-Med Onc (05/11/16 1400) Navigator Encounter Type: Treatment (05/11/16 1400)           Patient Visit Type: MedOnc (05/11/16 1400) Treatment Phase: Final Chemo TX (05/11/16 1400)                            Time Spent with Patient: 15 (05/11/16 1400)

## 2016-05-16 ENCOUNTER — Other Ambulatory Visit: Payer: Self-pay | Admitting: General Surgery

## 2016-05-17 ENCOUNTER — Ambulatory Visit: Payer: BLUE CROSS/BLUE SHIELD

## 2016-05-17 ENCOUNTER — Other Ambulatory Visit: Payer: BLUE CROSS/BLUE SHIELD

## 2016-05-18 ENCOUNTER — Telehealth: Payer: Self-pay

## 2016-05-18 DIAGNOSIS — Z6825 Body mass index (BMI) 25.0-25.9, adult: Secondary | ICD-10-CM | POA: Diagnosis not present

## 2016-05-18 DIAGNOSIS — H1033 Unspecified acute conjunctivitis, bilateral: Secondary | ICD-10-CM | POA: Diagnosis not present

## 2016-05-18 NOTE — Telephone Encounter (Signed)
Received VM from pt's husband stating pt was experiencing bilateral eye infections.  Pt's husband states pt has an appointment at 12:30 today with PCP but was unsure if this was where she needed to be evaluated.  I confirmed with Dr. Lindi Adie that pt should go to PCP as scheduled and can follow up with [place] as necessary.  I instructed pt to contact us should they have any questions or concerns, should they arise.  Pt's husband verbalized understanding to proceed to PCP for evaluation.

## 2016-05-22 ENCOUNTER — Encounter (HOSPITAL_BASED_OUTPATIENT_CLINIC_OR_DEPARTMENT_OTHER): Payer: Self-pay | Admitting: *Deleted

## 2016-05-24 ENCOUNTER — Ambulatory Visit: Payer: BLUE CROSS/BLUE SHIELD | Admitting: Radiation Oncology

## 2016-05-24 ENCOUNTER — Ambulatory Visit: Payer: BLUE CROSS/BLUE SHIELD

## 2016-05-24 NOTE — Progress Notes (Signed)
Location of Breast Cancer: Left Breast  Histology per Pathology Report:  11/03/15 Diagnosis Breast, left, needle core biopsy, 11:30 o'clock - INVASIVE DUCTAL CARCINOMA. - DUCTAL CARCINOMA IN SITU.  11/22/15 Diagnosis 1. Lymph node, sentinel, biopsy, left - THERE IS NO EVIDENCE OF CARCINOMA IN 1 OF 1 LYMPH NODE (0/1). 2. Lymph node, sentinel, biopsy, left - THERE IS NO EVIDENCE OF CARCINOMA IN 1 OF 1 LYMPH NODE (0/1). 3. Breast, lumpectomy, left - INVASIVE DUCTAL CARCINOMA, GRADE 2/3, SPANNING 1.5 CM. - DUCTAL CARCINOMA IN SITU WITH CALCIFICATIONS, INTERMEDIATE GRADE. - THE SURGICAL RESECTION MARGINS ARE NEGATIVE FOR CARCINOMA. Receptor Status: ER(100%), PR (30%), Her2-neu (NEG), Ki-(90%)  Did patient present with symptoms or was this found on screening mammography?: It was found on a screening mammogram.   Past/Anticipated interventions by surgeon, if any: 11/22/15 PROCEDURE:  Procedure(s): RADIOACTIVE SEED GUIDED PARTIAL MASTECTOMY WITH AXILLARY SENTINEL LYMPH NODE BIOPSY (Left) SURGEON:  Surgeon(s) and Role:    * Jovita Kussmaul, MD - Primary  Past/Anticipated interventions by medical oncology, if any:  Dr. Lindi Adie saw her last 05/10/16  12/29/2015 - 05/10/2016 Chemotherapy Adjuvant chemotherapy with dose dense Adriamycin Cytoxan 4 followed by Abraxane weekly 10 stopped early due to neuropathy   He plans to see her again in about 3 months after completion of radiation.   Lymphedema issues, if any: SHe denies. She reports good arm movement to her Left Arm.   Pain issues, if any:  She denies   SAFETY ISSUES:  Prior radiation? No  Pacemaker/ICD? No  Possible current pregnancy? No  Is the patient on methotrexate? No  Current Complaints / other details:   She has concerns about her eyes. She has recently seen her PCP for red eyes, drainage, and itching. She was given eye drops which have helped some, but relieved it completely.   BP 116/81   Pulse 82   Temp 98 F (36.7  C)   Ht '4\' 11"'  (1.499 m)   Wt 130 lb 4.8 oz (59.1 kg)   SpO2 100% Comment: room air  BMI 26.32 kg/m    Wt Readings from Last 3 Encounters:  05/31/16 130 lb 4.8 oz (59.1 kg)  05/25/16 127 lb (57.6 kg)  05/10/16 129 lb 14.4 oz (58.9 kg)      Heather [name], Stephani Police, RN 05/24/2016,2:04 PM

## 2016-05-25 ENCOUNTER — Encounter (HOSPITAL_BASED_OUTPATIENT_CLINIC_OR_DEPARTMENT_OTHER)
Admission: RE | Disposition: A | Discharge status: Home / Self Care | Payer: Self-pay | Source: Ambulatory Visit | Attending: General Surgery

## 2016-05-25 ENCOUNTER — Encounter (HOSPITAL_BASED_OUTPATIENT_CLINIC_OR_DEPARTMENT_OTHER): Payer: Self-pay | Admitting: *Deleted

## 2016-05-25 ENCOUNTER — Ambulatory Visit (HOSPITAL_BASED_OUTPATIENT_CLINIC_OR_DEPARTMENT_OTHER): Payer: BLUE CROSS/BLUE SHIELD | Admitting: Anesthesiology

## 2016-05-25 ENCOUNTER — Ambulatory Visit (HOSPITAL_BASED_OUTPATIENT_CLINIC_OR_DEPARTMENT_OTHER)
Admission: RE | Admit: 2016-05-25 | Discharge: 2016-05-25 | Disposition: A | Discharge status: Home / Self Care | Payer: BLUE CROSS/BLUE SHIELD | Source: Ambulatory Visit | Attending: General Surgery | Admitting: General Surgery

## 2016-05-25 DIAGNOSIS — Z452 Encounter for adjustment and management of vascular access device: Secondary | ICD-10-CM | POA: Diagnosis not present

## 2016-05-25 DIAGNOSIS — Z853 Personal history of malignant neoplasm of breast: Secondary | ICD-10-CM | POA: Diagnosis not present

## 2016-05-25 DIAGNOSIS — K219 Gastro-esophageal reflux disease without esophagitis: Secondary | ICD-10-CM | POA: Diagnosis not present

## 2016-05-25 DIAGNOSIS — C50919 Malignant neoplasm of unspecified site of unspecified female breast: Secondary | ICD-10-CM | POA: Diagnosis not present

## 2016-05-25 HISTORY — PX: PORT-A-CATH REMOVAL: SHX5289

## 2016-05-25 SURGERY — REMOVAL PORT-A-CATH
Anesthesia: Monitor Anesthesia Care | Site: Chest | Laterality: Right

## 2016-05-25 MED ORDER — CHLORHEXIDINE GLUCONATE CLOTH 2 % EX PADS: 6.0000 | MEDICATED_PAD | Freq: Once | CUTANEOUS | Status: DC

## 2016-05-25 MED ORDER — LACTATED RINGERS IV SOLN
INTRAVENOUS | Status: DC
  Administered 2016-05-25: 12:00:00 via INTRAVENOUS

## 2016-05-25 MED ORDER — PROPOFOL 10 MG/ML IV BOLUS
INTRAVENOUS | Status: DC | PRN
  Administered 2016-05-25: 20 mg via INTRAVENOUS
  Administered 2016-05-25: 10 mg via INTRAVENOUS

## 2016-05-25 MED ORDER — SCOPOLAMINE 1 MG/3DAYS TD PT72: 1.0000 | MEDICATED_PATCH | Freq: Once | TRANSDERMAL | Status: DC | PRN

## 2016-05-25 MED ORDER — PROPOFOL 10 MG/ML IV BOLUS
INTRAVENOUS | Status: AC
  Filled 2016-05-25: qty 20

## 2016-05-25 MED ORDER — HYDROCODONE-ACETAMINOPHEN 5-325 MG PO TABS: 1.0000 | ORAL_TABLET | ORAL | 0 refills | Status: DC | PRN

## 2016-05-25 MED ORDER — LIDOCAINE HCL (CARDIAC) 20 MG/ML IV SOLN
INTRAVENOUS | Status: DC | PRN
  Administered 2016-05-25: 50 mg via INTRAVENOUS

## 2016-05-25 MED ORDER — FENTANYL CITRATE (PF) 100 MCG/2ML IJ SOLN
INTRAMUSCULAR | Status: AC
  Filled 2016-05-25: qty 2

## 2016-05-25 MED ORDER — LIDOCAINE 2% (20 MG/ML) 5 ML SYRINGE
INTRAMUSCULAR | Status: AC
  Filled 2016-05-25: qty 5

## 2016-05-25 MED ORDER — MIDAZOLAM HCL 2 MG/2ML IJ SOLN
INTRAMUSCULAR | Status: AC
  Filled 2016-05-25: qty 2

## 2016-05-25 MED ORDER — FENTANYL CITRATE (PF) 100 MCG/2ML IJ SOLN
50.0000 ug | INTRAMUSCULAR | Status: DC | PRN
  Administered 2016-05-25 (×2): 50 ug via INTRAVENOUS

## 2016-05-25 MED ORDER — LIDOCAINE HCL (PF) 1 % IJ SOLN
INTRAMUSCULAR | Status: AC
  Filled 2016-05-25: qty 30

## 2016-05-25 MED ORDER — MIDAZOLAM HCL 2 MG/2ML IJ SOLN
1.0000 mg | INTRAMUSCULAR | Status: DC | PRN
  Administered 2016-05-25: 2 mg via INTRAVENOUS

## 2016-05-25 MED ORDER — LIDOCAINE HCL 1 % IJ SOLN
INTRAMUSCULAR | Status: DC | PRN
  Administered 2016-05-25: 7 mL via SUBCUTANEOUS

## 2016-05-25 MED ORDER — GLYCOPYRROLATE 0.2 MG/ML IJ SOLN: 0.2000 mg | Freq: Once | INTRAMUSCULAR | Status: DC | PRN

## 2016-05-25 SURGICAL SUPPLY — 29 items
BLADE SURG 15 STRL LF DISP TIS (BLADE) ×1 IMPLANT
BLADE SURG 15 STRL SS (BLADE) ×1
CHLORAPREP W/TINT 26ML (MISCELLANEOUS) ×2 IMPLANT
COVER BACK TABLE 60X90IN (DRAPES) ×2 IMPLANT
COVER MAYO STAND STRL (DRAPES) ×2 IMPLANT
DECANTER SPIKE VIAL GLASS SM (MISCELLANEOUS) ×2 IMPLANT
DRAPE LAPAROTOMY 100X72 PEDS (DRAPES) ×2 IMPLANT
DRAPE UTILITY XL STRL (DRAPES) ×2 IMPLANT
ELECT COATED BLADE 2.86 ST (ELECTRODE) ×2 IMPLANT
ELECT REM PT RETURN 9FT ADLT (ELECTROSURGICAL) ×2
ELECTRODE REM PT RTRN 9FT ADLT (ELECTROSURGICAL) ×1 IMPLANT
GLOVE BIO SURGEON STRL SZ7.5 (GLOVE) ×4 IMPLANT
GLOVE BIOGEL PI IND STRL 7.0 (GLOVE) ×1 IMPLANT
GLOVE BIOGEL PI IND STRL 7.5 (GLOVE) ×1 IMPLANT
GLOVE BIOGEL PI INDICATOR 7.0 (GLOVE) ×1
GLOVE BIOGEL PI INDICATOR 7.5 (GLOVE) ×1
GOWN STRL REUS W/ TWL LRG LVL3 (GOWN DISPOSABLE) ×2 IMPLANT
GOWN STRL REUS W/TWL LRG LVL3 (GOWN DISPOSABLE) ×2
LIQUID BAND (GAUZE/BANDAGES/DRESSINGS) ×2 IMPLANT
NEEDLE HYPO 25X1 1.5 SAFETY (NEEDLE) ×2 IMPLANT
PACK BASIN DAY SURGERY FS (CUSTOM PROCEDURE TRAY) ×2 IMPLANT
PENCIL BUTTON HOLSTER BLD 10FT (ELECTRODE) ×2 IMPLANT
SLEEVE SCD COMPRESS KNEE MED (MISCELLANEOUS) IMPLANT
SUT MON AB 4-0 PC3 18 (SUTURE) ×2 IMPLANT
SUT VIC AB 3-0 SH 27 (SUTURE) ×1
SUT VIC AB 3-0 SH 27X BRD (SUTURE) ×1 IMPLANT
SYR CONTROL 10ML LL (SYRINGE) ×2 IMPLANT
TOWEL OR 17X24 6PK STRL BLUE (TOWEL DISPOSABLE) ×2 IMPLANT
TOWEL OR NON WOVEN STRL DISP B (DISPOSABLE) ×2 IMPLANT

## 2016-05-25 NOTE — Anesthesia Postprocedure Evaluation (Signed)
Anesthesia Post Note  Patient: Heather [name]  Procedure(s) Performed: Procedure(s) (LRB): REMOVAL PORT-A-CATH (Right)  Patient location during evaluation: PACU Anesthesia Type: MAC Level of consciousness: awake and alert Pain management: pain level controlled Vital Signs Assessment: post-procedure vital signs reviewed and stable Respiratory status: spontaneous breathing, nonlabored ventilation, respiratory function stable and patient connected to nasal cannula oxygen Cardiovascular status: stable and blood pressure returned to baseline Anesthetic complications: no    Last Vitals:  Vitals:   05/25/16 1345 05/25/16 1400  BP: 107/74 114/87  Pulse: 70 70  Resp: 13 18  Temp:  36.6 C    Last Pain:  Vitals:   05/25/16 1150  TempSrc: Oral                 Darnel Mchan S

## 2016-05-25 NOTE — Op Note (Signed)
05/25/2016  1:14 PM  PATIENT:  Heather [name]  60 y.o. female  PRE-OPERATIVE DIAGNOSIS:  Left Breast cancer  POST-OPERATIVE DIAGNOSIS:  Left Breast cancer  PROCEDURE:  Procedure(s): REMOVAL PORT-A-CATH (Right)  SURGEON:  Surgeon(s) and Role:    * Jovita Kussmaul, MD - Primary  PHYSICIAN ASSISTANT:   ASSISTANTS: none   ANESTHESIA:   local and IV sedation  EBL:  Total I/O In: 400 [I.V.:400] Out: -   BLOOD ADMINISTERED:none  DRAINS: none   LOCAL MEDICATIONS USED:  MARCAINE     SPECIMEN:  No Specimen  DISPOSITION OF SPECIMEN:  N/A  COUNTS:  YES  TOURNIQUET:  * No tourniquets in log *  DICTATION: .Dragon Dictation   After informed consent was obtained the patient was brought to the operating room and placed in the supine position on the operating room table.After IV sedation had been given the patient's right chest wall was prepped with ChloraPrep, allowed to dry, and draped in usual sterile manner. An appropriate timeout was performed. The area around the port was infiltrated with a combination of 1% lidocaine and quarter percent Marcaine until a good field block was created. A small incision was made with a 15 blade knife through her old incision. The incision was carried through the skin and subcutaneous tissue sharply with the 15 blade knife until the capsule around the port was opened. The 2 anchoring stitches were divided and removed. The port was then gently pushed out of its pocket and with gentle traction was removed from the patient without difficulty. Pressure was held for several minutes until the area was completely hemostatic. The deep layer of the wound was then closed with interrupted 3-0 Vicryl stitches. Skin was then closed with a running 4-0 Monocryl subcuticular stitch. Dermabond dressings were applied. The patient tolerated the procedure well. At the end of the case all needle sponge and instrument counts were correct. The patient was then awakened and taken to  recovery in stable condition.  PLAN OF CARE: Discharge to home after PACU  PATIENT DISPOSITION:  PACU - hemodynamically stable.   Delay start of Pharmacological VTE agent (>24hrs) due to surgical blood loss or risk of bleeding: not applicable

## 2016-05-25 NOTE — Anesthesia Preprocedure Evaluation (Signed)
Anesthesia Evaluation  Patient identified by MRN, date of birth, ID band Patient awake    Reviewed: Allergy & Precautions, NPO status , Patient's Chart, lab work & pertinent test results  History of Anesthesia Complications (+) PONV  Airway Mallampati: II   Neck ROM: full    Dental   Pulmonary neg pulmonary ROS,    breath sounds clear to auscultation       Cardiovascular negative cardio ROS   Rhythm:regular Rate:Normal     Neuro/Psych    GI/Hepatic GERD  ,  Endo/Other    Renal/GU      Musculoskeletal   Abdominal   Peds  Hematology   Anesthesia Other Findings   Reproductive/Obstetrics                             Anesthesia Physical Anesthesia Plan  ASA: II  Anesthesia Plan: MAC   Post-op Pain Management:    Induction: Intravenous  Airway Management Planned: Simple Face Mask  Additional Equipment:   Intra-op Plan:   Post-operative Plan:   Informed Consent: I have reviewed the patients History and Physical, chart, labs and discussed the procedure including the risks, benefits and alternatives for the proposed anesthesia with the patient or authorized representative who has indicated his/her understanding and acceptance.     Plan Discussed with: CRNA, Anesthesiologist and Surgeon  Anesthesia Plan Comments:         Anesthesia Quick Evaluation

## 2016-05-25 NOTE — Discharge Instructions (Signed)

## 2016-05-25 NOTE — Transfer of Care (Signed)
Immediate Anesthesia Transfer of Care Note  Patient: Heather [name]  Procedure(s) Performed: Procedure(s): REMOVAL PORT-A-CATH (Right)  Patient Location: PACU  Anesthesia Type:MAC  Level of Consciousness: awake, oriented and patient cooperative  Airway & Oxygen Therapy: Patient Spontanous Breathing and Patient connected to face mask oxygen  Post-op Assessment: Report given to RN and Post -op Vital signs reviewed and stable  Post vital signs: Reviewed and stable  Last Vitals:  Vitals:   05/25/16 1150  BP: (!) 130/92  Pulse: 80  Resp: 18  Temp: 36.6 C    Last Pain:  Vitals:   05/25/16 1150  TempSrc: Oral         Complications: No apparent anesthesia complications

## 2016-05-25 NOTE — H&P (Signed)
Heather [name]  Location: [facility] Patient #: [id number] DOB: December 05, 1955 Married / Language: Spanish / Race: Undefined Female   History of Present Illness  Patient words: po lumpectomy.  The patient is a 60 year old female who presents for a follow-up for Breast cancer. The patient is a 60 year old Hispanic female who is about 6 months status post left breast lumpectomy and sentinel node mapping for a T1 CN 0 left breast cancer. She was ER and PR positive and HER-2 negative with a Ki-67 of 90%. She tolerated the surgery well. She denies any significant pain. she has completed chemotherapy.    Problem List/Past Medical  BREAST CANCER OF UPPER-INNER QUADRANT OF LEFT FEMALE BREAST (C50.212)  Other Problems  Breast Cancer  Past Surgical History  Cesarean Section - 1 Colon Polyp Removal - Colonoscopy Colon Polyp Removal - Open Hysterectomy (not due to cancer) - Partial  Diagnostic Studies History  Colonoscopy within last year Mammogram within last year  Allergies No Known Drug Allergies  Medication History  No Current Medications Medications Reconciled  Social History Alcohol use Occasional alcohol use. Caffeine use Coffee. No drug use Tobacco use Never smoker.  Family History  Kidney Disease Brother, Sister. Thyroid problems Sister.  Pregnancy / Birth History Age at menarche 60 years. Gravida 2 Irregular periods Maternal age 69-20 Para 2    Review of Systems General Not Present- Appetite Loss, Chills, Fatigue, Fever, Night Sweats, Weight Gain and Weight Loss. Skin Not Present- Change in Wart/Mole, Dryness, Hives, Jaundice, New Lesions, Non-Healing Wounds, Rash and Ulcer. HEENT Not Present- Earache, Hearing Loss, Hoarseness, Nose Bleed, Oral Ulcers, Ringing in the Ears, Seasonal Allergies, Sinus Pain, Sore Throat, Visual Disturbances, Wears glasses/contact lenses and Yellow Eyes. Respiratory Not Present- Bloody sputum,  Chronic Cough, Difficulty Breathing, Snoring and Wheezing. Breast Not Present- Breast Mass, Breast Pain, Nipple Discharge and Skin Changes. Cardiovascular Not Present- Chest Pain, Difficulty Breathing Lying Down, Leg Cramps, Palpitations, Rapid Heart Rate, Shortness of Breath and Swelling of Extremities. Gastrointestinal Not Present- Abdominal Pain, Bloating, Bloody Stool, Change in Bowel Habits, Chronic diarrhea, Constipation, Difficulty Swallowing, Excessive gas, Gets full quickly at meals, Hemorrhoids, Indigestion, Nausea, Rectal Pain and Vomiting. Female Genitourinary Not Present- Frequency, Nocturia, Painful Urination, Pelvic Pain and Urgency. Musculoskeletal Not Present- Back Pain, Joint Pain, Joint Stiffness, Muscle Pain, Muscle Weakness and Swelling of Extremities. Neurological Not Present- Decreased Memory, Fainting, Headaches, Numbness, Seizures, Tingling, Tremor, Trouble walking and Weakness. Psychiatric Not Present- Anxiety, Bipolar, Change in Sleep Pattern, Depression, Fearful and Frequent crying. Endocrine Not Present- Cold Intolerance, Excessive Hunger, Hair Changes, Heat Intolerance, Hot flashes and New Diabetes. Hematology Not Present- Easy Bruising, Excessive bleeding, Gland problems, HIV and Persistent Infections.  Vitals 12/17/2015 11:47 AM Weight: 138.38 lb Height: 57in Body Surface Area: 1.54 m Body Mass Index: 29.94 kg/m  BP: 116/82 (Sitting, Left Arm, Standard)       Physical Exam  General Mental Status-Alert. General Appearance-Consistent with stated age. Hydration-Well hydrated. Voice-Normal.  Head and Neck Head-normocephalic, atraumatic with no lesions or palpable masses. Trachea-midline. Thyroid Gland Characteristics - normal size and consistency.  Eye Eyeball - Bilateral-Extraocular movements intact. Sclera/Conjunctiva - Bilateral-No scleral icterus.  Chest and Lung Exam Chest and lung exam reveals -quiet, even and easy  respiratory effort with no use of accessory muscles and on auscultation, normal breath sounds, no adventitious sounds and normal vocal resonance. Inspection Chest Wall - Normal. Back - normal.  Breast Note: The left breast and axillary incisions are healing nicely with no sign  of infection or seroma.   Cardiovascular Cardiovascular examination reveals -normal heart sounds, regular rate and rhythm with no murmurs and normal pedal pulses bilaterally.  Abdomen Inspection Inspection of the abdomen reveals - No Hernias. Skin - Scar - no surgical scars. Palpation/Percussion Palpation and Percussion of the abdomen reveal - Soft, Non Tender, No Rebound tenderness, No Rigidity (guarding) and No hepatosplenomegaly. Auscultation Auscultation of the abdomen reveals - Bowel sounds normal.  Neurologic Neurologic evaluation reveals -alert and oriented x 3 with no impairment of recent or remote memory. Mental Status-Normal.  Musculoskeletal Normal Exam - Left-Upper Extremity Strength Normal and Lower Extremity Strength Normal. Normal Exam - Right-Upper Extremity Strength Normal and Lower Extremity Strength Normal.  Lymphatic Head & Neck  General Head & Neck Lymphatics: Bilateral - Description - Normal. Axillary  General Axillary Region: Bilateral - Description - Normal. Tenderness - Non Tender. Femoral & Inguinal  Generalized Femoral & Inguinal Lymphatics: Bilateral - Description - Normal. Tenderness - Non Tender.    Assessment & Plan  BREAST CANCER OF UPPER-INNER QUADRANT OF LEFT FEMALE BREAST (C50.212) Impression: The patient is 6 months status post left breast lumpectomy for breast cancer. She is healing well.  She presents now for removal of her port. The risks and benefits of the surgery as well as some of the technical aspects were discussed with the patient and she understands and wishes to proceed

## 2016-05-25 NOTE — Interval H&P Note (Signed)
History and Physical Interval Note:  05/25/2016 12:33 PM  Heather [name]  has presented today for surgery, with the diagnosis of left Breast cancer  The various methods of treatment have been discussed with the patient and family. After consideration of risks, benefits and other options for treatment, the patient has consented to  Procedure(s): REMOVAL PORT-A-CATH (N/A) as a surgical intervention .  The patient's history has been reviewed, patient examined, no change in status, stable for surgery.  I have reviewed the patient's chart and labs.  Questions were answered to the patient's satisfaction.     TOTH III,Savio Albrecht S

## 2016-05-29 ENCOUNTER — Encounter (HOSPITAL_BASED_OUTPATIENT_CLINIC_OR_DEPARTMENT_OTHER): Payer: Self-pay | Admitting: General Surgery

## 2016-05-31 ENCOUNTER — Ambulatory Visit
Admission: RE | Admit: 2016-05-31 | Discharge: 2016-05-31 | Disposition: A | Discharge status: Home / Self Care | Payer: BLUE CROSS/BLUE SHIELD | Source: Ambulatory Visit | Attending: Radiation Oncology | Admitting: Radiation Oncology

## 2016-05-31 ENCOUNTER — Encounter: Payer: Self-pay | Admitting: Radiation Oncology

## 2016-05-31 DIAGNOSIS — K219 Gastro-esophageal reflux disease without esophagitis: Secondary | ICD-10-CM | POA: Diagnosis not present

## 2016-05-31 DIAGNOSIS — Z79899 Other long term (current) drug therapy: Secondary | ICD-10-CM | POA: Diagnosis not present

## 2016-05-31 DIAGNOSIS — Z9221 Personal history of antineoplastic chemotherapy: Secondary | ICD-10-CM | POA: Diagnosis not present

## 2016-05-31 DIAGNOSIS — Z8249 Family history of ischemic heart disease and other diseases of the circulatory system: Secondary | ICD-10-CM | POA: Diagnosis not present

## 2016-05-31 DIAGNOSIS — Z170 Estrogen receptor positive status [ER+]: Secondary | ICD-10-CM | POA: Insufficient documentation

## 2016-05-31 DIAGNOSIS — C50412 Malignant neoplasm of upper-outer quadrant of left female breast: Secondary | ICD-10-CM | POA: Insufficient documentation

## 2016-05-31 DIAGNOSIS — Z510 Encounter for antineoplastic radiation therapy: Secondary | ICD-10-CM | POA: Diagnosis not present

## 2016-05-31 DIAGNOSIS — Z833 Family history of diabetes mellitus: Secondary | ICD-10-CM | POA: Diagnosis not present

## 2016-05-31 NOTE — Progress Notes (Signed)
Radiation Oncology         (336) [phone] ________________________________  Initial outpatient Consultation  Name: Heather [name] MRN: [id number]  Date: 05/31/2016  DOB: April 11, 1956  CC:Pcp Not In System  Nicholas Lose, MD   REFERRING PHYSICIAN: Nicholas Lose, MD  DIAGNOSIS:     ICD-9-CM ICD-10-CM   1. 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 Geralyn Flash NOTE: 10/19/2015 Mammogram Screening mammogram revealed left breast asymmetry 1.1 x 0.8 x 1 cm, T1 cN0 stage IA clinical stage    11/03/2015 Initial Diagnosis Left breast biopsy 11:30 position: Invasive ductal carcinoma with DCIS, grade 2, ER 100%, PR 30%, Ki-67 90%, HER-2 negative ratio 1.44   11/22/2015 Surgery Lt Lumpectomy: IDC 1.5 cm, 0/2 LN, grade 2, with Int Grade DCIS, ER 100%, PR 30%, Her 2 Neg Ratio 1.91, T1cN0 (stage 1A)Oncotype DX score 37, 10 year ROR 25%   12/29/2015 - 05/10/2016 Chemotherapy Adjuvant chemotherapy with dose dense Adriamycin Cytoxan 4 followed by Abraxane weekly 10 stopped early due to neuropathy     HISTORY OF PRESENT ILLNESS::Heather [name] is a 60 y.o. female who presented with breast cancer of the left breast. She has been kindly referred to me by Dr. Lindi Adie.   Lumpectomy on 11-22-15 with SLN bx revealed a 1.5 cm tumor , negative nodes, negative margins, and pathology as above in the diagnosis.  After an initial consult with Dr. Lindi Adie, the patient had been undergoing chemotherapy, Adriamycin Cytoxan x 4 followed by Abraxane weekly x 10, but stopped this early on 05/10/2016 due to neuropathy.    The patient discontinued her chemotherapy after feeling increased feelings of neuropathy in her fingers and toes.  She is here today to discuss her options for radiotherapy with me.   PREVIOUS RADIATION THERAPY: No  PAST MEDICAL HISTORY:  has a past medical history of  Cancer ([place]); GERD (gastroesophageal reflux disease); NSVD (normal spontaneous vaginal delivery); and PONV (postoperative nausea and vomiting).    PAST SURGICAL HISTORY: Past Surgical History:  Procedure Laterality Date  . ABDOMINAL HYSTERECTOMY     TAH   (FIBROIDS)  . CESAREAN SECTION     BTSP  . PORT-A-CATH REMOVAL Right 05/25/2016   Procedure: REMOVAL PORT-A-CATH;  Surgeon: Autumn Messing III, MD;  Location: [facility];  Service: General;  Laterality: Right;  . PORTACATH PLACEMENT Right 12/27/2015   Procedure: INSERTION PORT-A-CATH;  Surgeon: Autumn Messing III, MD;  Location: [facility];  Service: General;  Laterality: Right;  . RADIOACTIVE SEED GUIDED MASTECTOMY WITH AXILLARY SENTINEL LYMPH NODE BIOPSY Left 11/22/2015   Procedure: RADIOACTIVE SEED GUIDED PARTIAL MASTECTOMY WITH AXILLARY SENTINEL LYMPH NODE BIOPSY;  Surgeon: Autumn Messing III, MD;  Location: [facility];  Service: General;  Laterality: Left;    FAMILY HISTORY: family history includes Cirrhosis in her father; Diabetes in her brother; Hypertension in her brother and sister; Ulcers in her mother.  SOCIAL HISTORY:  reports that she has never smoked. She has never used smokeless tobacco. She reports that she drinks alcohol. She reports that she does not use drugs.  ALLERGIES: Review of patient's allergies indicates no known allergies.  MEDICATIONS:  Current Outpatient Prescriptions  Medication Sig Dispense Refill  . cholecalciferol (VITAMIN D) 1000 UNITS tablet Take 1,000 Units by mouth daily.    Marland Kitchen tobramycin (TOBREX) 0.3 % ophthalmic solution Place 1  drop into both eyes every 4 (four) hours.    Marland Kitchen VITAMIN E COMPLEX PO Take by mouth.    . Ascorbic Acid (VITAMIN C) 1000 MG tablet Take 1,000 mg by mouth daily.    . calcium carbonate (OS-CAL) 600 MG TABS Take 600 mg by mouth 2 (two) times daily with a meal.    . HYDROcodone-acetaminophen (NORCO/VICODIN) 5-325 MG tablet Take 1-2 tablets by mouth every 4 (four) hours  as needed for moderate pain or severe pain. (Patient not taking: Reported on 05/31/2016) 20 tablet 0  . ondansetron (ZOFRAN) 8 MG tablet Take 1 tablet (8 mg total) by mouth 2 (two) times daily as needed (Nausea or vomiting). (Patient not taking: Reported on 04/03/2016) 30 tablet 1  . oxyCODONE-acetaminophen (ROXICET) 5-325 MG tablet Take 1-2 tablets by mouth every 4 (four) hours as needed. (Patient not taking: Reported on 04/03/2016) 50 tablet 0  . prochlorperazine (COMPAZINE) 10 MG tablet Take 1 tablet (10 mg total) by mouth every 6 (six) hours as needed (Nausea or vomiting). (Patient not taking: Reported on 04/03/2016) 30 tablet 1   No current facility-administered medications for this encounter.     REVIEW OF SYSTEMS:  Notable for that above. Tearing of eyes since chemotherapy.  Following w/ PCP for this   PHYSICAL EXAM:  height is '4\' 11"'  (1.499 m) and weight is 130 lb 4.8 oz (59.1 kg). Her temperature is 98 F (36.7 C). Her blood pressure is 116/81 and her pulse is 82. Her oxygen saturation is 100%.   General: Alert and oriented, in no acute distress HEENT: Head is normocephalic. Extraocular movements are intact. Oropharynx is clear. Pt has redness of the sclera bilaterally particularly on the right side with associated tearing of her eyes.  Neck: Neck is supple, no palpable cervical or supraclavicular lymphadenopathy. Heart: Regular in rate and rhythm with no murmurs, rubs, or gallops. Chest: Clear to auscultation bilaterally, with no rhonchi, wheezes, or rales. Abdomen: Soft, nontender, nondistended, with no rigidity or guarding. Extremities: No cyanosis or edema. Lymphatics: see Neck Exam Skin: No concerning lesions. Musculoskeletal: symmetric strength and muscle tone throughout. Neurologic: Cranial nerves II through XII are grossly intact. No obvious focalities. Speech is fluent. Coordination is intact. Psychiatric: Judgment and insight are intact. Affect is appropriate. Breasts: Well healed  surgical scars of the left breast. No palpable masses in the breast or axillary regions bilaterally.    ECOG = 1  0 - Asymptomatic (Fully active, able to carry on all predisease activities without restriction)  1 - Symptomatic but completely ambulatory (Restricted in physically strenuous activity but ambulatory and able to carry out work of a light or sedentary nature. For example, light housework, office work)  2 - Symptomatic, <50% in bed during the day (Ambulatory and capable of all self care but unable to carry out any work activities. Up and about more than 50% of waking hours)  3 - Symptomatic, >50% in bed, but not bedbound (Capable of only limited self-care, confined to bed or chair 50% or more of waking hours)  4 - Bedbound (Completely disabled. Cannot carry on any self-care. Totally confined to bed or chair)  5 - Death   Eustace Pen MM, Creech RH, Tormey DC, et al. [phone]). "Toxicity and response criteria of the [facility]". [place] Oncol. 5 (6): 649-55   LABORATORY DATA:  Lab Results  Component Value Date   WBC 3.4 (L) 05/10/2016   HGB 12.7 05/10/2016   HCT 38.9 05/10/2016  MCV 95.2 05/10/2016   PLT 269 05/10/2016   CMP     Component Value Date/Time   NA 140 05/10/2016 1416   K 3.9 05/10/2016 1416   CO2 24 05/10/2016 1416   GLUCOSE 98 05/10/2016 1416   BUN 11.8 05/10/2016 1416   CREATININE 0.7 05/10/2016 1416   CALCIUM 9.6 05/10/2016 1416   PROT 7.1 05/10/2016 1416   ALBUMIN 3.8 05/10/2016 1416   AST 35 (H) 05/10/2016 1416   ALT 31 05/10/2016 1416   ALKPHOS 100 05/10/2016 1416   BILITOT <0.30 05/10/2016 1416         RADIOGRAPHY: No results found.    IMPRESSION/PLAN: STAGE T1cN0M0 left breast cancer, ER+  It was a pleasure meeting the patient today. We discussed the risks, benefits, and side effects of radiotherapy. I recommend radiotherapy to the left breast to reduce her risk of locoregional recurrence by 2/3.  We discussed that  radiation would take approximately 6 weeks to complete and that I would give the patient a few weeks to heal following surgery before starting treatment planning. . We spoke about acute effects including skin irritation and fatigue as well as much less common late effects including internal organ injury or irritation. We spoke about the latest technology that is used to minimize the risk of late effects for patients undergoing radiotherapy to the breast or chest wall. No guarantees of treatment were given. The patient is enthusiastic about proceeding with treatment. I look forward to participating in the patient's care.  Consent signed today. Translator present today. Ordering simulation for near future.   __________________________________________   Eppie Gibson, MD    This document serves as a record of services personally performed by Eppie Gibson , MD. It was created on her behalf by Truddie Hidden, a trained medical scribe. The creation of this record is based on the scribe's personal observations and the provider's statements to them. This document has been checked and approved by the attending provider.

## 2016-06-05 ENCOUNTER — Ambulatory Visit
Admission: RE | Admit: 2016-06-05 | Discharge: 2016-06-05 | Disposition: A | Discharge status: Home / Self Care | Payer: BLUE CROSS/BLUE SHIELD | Source: Ambulatory Visit | Attending: Radiation Oncology | Admitting: Radiation Oncology

## 2016-06-05 DIAGNOSIS — Z170 Estrogen receptor positive status [ER+]: Secondary | ICD-10-CM | POA: Diagnosis not present

## 2016-06-05 DIAGNOSIS — Z833 Family history of diabetes mellitus: Secondary | ICD-10-CM | POA: Diagnosis not present

## 2016-06-05 DIAGNOSIS — Z510 Encounter for antineoplastic radiation therapy: Secondary | ICD-10-CM | POA: Diagnosis not present

## 2016-06-05 DIAGNOSIS — C50412 Malignant neoplasm of upper-outer quadrant of left female breast: Secondary | ICD-10-CM

## 2016-06-05 DIAGNOSIS — Z9221 Personal history of antineoplastic chemotherapy: Secondary | ICD-10-CM | POA: Diagnosis not present

## 2016-06-05 DIAGNOSIS — Z8249 Family history of ischemic heart disease and other diseases of the circulatory system: Secondary | ICD-10-CM | POA: Diagnosis not present

## 2016-06-05 DIAGNOSIS — Z79899 Other long term (current) drug therapy: Secondary | ICD-10-CM | POA: Diagnosis not present

## 2016-06-05 DIAGNOSIS — K219 Gastro-esophageal reflux disease without esophagitis: Secondary | ICD-10-CM | POA: Diagnosis not present

## 2016-06-05 NOTE — Progress Notes (Signed)
Radiation Oncology         (336) [phone] ________________________________  Name: Heather [name] MRN: [id number]  Date: 06/05/2016  DOB: [birth year]  SIMULATION AND TREATMENT PLANNING NOTE,  Special treatment procedure  Outpatient  DIAGNOSIS:     ICD-9-CM ICD-10-CM   1. Breast cancer of upper-outer quadrant of left female breast ([place]) 174.4 C50.412     NARRATIVE:  The patient was brought to the [facility].  Identity was confirmed.  All relevant records and images related to the planned course of therapy were reviewed.  The patient freely provided informed written consent to proceed with treatment after reviewing the details related to the planned course of therapy. The consent form was witnessed and verified by the simulation staff.    Then, the patient was set-up in a stable reproducible supine position for radiation therapy with her ipsilateral arm over her head, and her upper body secured in a custom-made Vac-lok device.  CT images were obtained.  Surface markings were placed.  The CT images were loaded into the planning software.    Special treatment procedure was performed today due to the extra time and effort required by myself to plan and prepare this patient for deep inspiration breath hold technique.  I have determined cardiac sparing to be of benefit to this patient to prevent long term cardiac damage due to radiation of the heart.  Bellows were placed on the patient's abdomen. To facilitate cardiac sparing, the patient was coached by the radiation therapists on breath hold techniques and breathing practice was performed. Practice waveforms were obtained. The patient was then scanned while maintaining breath hold in the treatment position.  This image was then transferred over to the imaging specialist. The imaging specialist then created a fusion of the free breathing and breath hold scans using the chest wall as the stable structure. I personally reviewed the fusion in  axial, coronal and sagittal image planes.  Excellent cardiac sparing was obtained.  I felt the patient is an appropriate candidate for breath hold and the patient will be treated as such.  The image fusion was then reviewed with the patient to reinforce the necessity of reproducible breath hold.   A total of 3 medically necessary complex treatment devices were fabricated and supervised by me: 2 fields with MLCs for custom blocks to protect heart, and lungs;  and, a Vac-lok. MORE COMPLEX DEVICES MAY BE MADE IN DOSIMETRY FOR FIELD IN FIELD BEAMS FOR DOSE HOMOGENEITY.  I have requested : 3D Simulation  I have requested a DVH of the following structures: lungs, heart, lumpectomy cavity.    The patient will receive 50 Gy in 25 fractions to the left breast with 2 tangential fields.   This will be followed by a boost.  Optical Surface Tracking Plan:  Since intensity modulated radiotherapy (IMRT) and 3D conformal radiation treatment methods are predicated on accurate and precise positioning for treatment, intrafraction motion monitoring is medically necessary to ensure accurate and safe treatment delivery. The ability to quantify intrafraction motion without excessive ionizing radiation dose can only be performed with optical surface tracking. Accordingly, surface imaging offers the opportunity to obtain 3D measurements of patient position throughout IMRT and 3D treatments without excessive radiation exposure. I am ordering optical surface tracking for this patient's upcoming course of radiotherapy.  ________________________________   Reference:  Ursula Alert, J, et al. Surface imaging-based analysis of intrafraction motion for breast radiotherapy patients.Journal of [place], v. 15,  n. 6, nov. 2014. ISSN [id number].  Available at: <http://www.jacmp.org/index.php/jacmp/article/view/4957>.    -----------------------------------  Eppie Gibson, MD

## 2016-06-06 DIAGNOSIS — Z79899 Other long term (current) drug therapy: Secondary | ICD-10-CM | POA: Diagnosis not present

## 2016-06-06 DIAGNOSIS — Z170 Estrogen receptor positive status [ER+]: Secondary | ICD-10-CM | POA: Diagnosis not present

## 2016-06-06 DIAGNOSIS — Z833 Family history of diabetes mellitus: Secondary | ICD-10-CM | POA: Diagnosis not present

## 2016-06-06 DIAGNOSIS — C50412 Malignant neoplasm of upper-outer quadrant of left female breast: Secondary | ICD-10-CM | POA: Diagnosis not present

## 2016-06-06 DIAGNOSIS — Z510 Encounter for antineoplastic radiation therapy: Secondary | ICD-10-CM | POA: Diagnosis not present

## 2016-06-06 DIAGNOSIS — K219 Gastro-esophageal reflux disease without esophagitis: Secondary | ICD-10-CM | POA: Diagnosis not present

## 2016-06-06 DIAGNOSIS — Z8249 Family history of ischemic heart disease and other diseases of the circulatory system: Secondary | ICD-10-CM | POA: Diagnosis not present

## 2016-06-06 DIAGNOSIS — Z9221 Personal history of antineoplastic chemotherapy: Secondary | ICD-10-CM | POA: Diagnosis not present

## 2016-06-13 ENCOUNTER — Ambulatory Visit
Admission: RE | Admit: 2016-06-13 | Discharge: 2016-06-13 | Disposition: A | Discharge status: Home / Self Care | Payer: BLUE CROSS/BLUE SHIELD | Source: Ambulatory Visit | Attending: Radiation Oncology | Admitting: Radiation Oncology

## 2016-06-13 DIAGNOSIS — Z8249 Family history of ischemic heart disease and other diseases of the circulatory system: Secondary | ICD-10-CM | POA: Diagnosis not present

## 2016-06-13 DIAGNOSIS — Z9221 Personal history of antineoplastic chemotherapy: Secondary | ICD-10-CM | POA: Diagnosis not present

## 2016-06-13 DIAGNOSIS — Z170 Estrogen receptor positive status [ER+]: Secondary | ICD-10-CM | POA: Diagnosis not present

## 2016-06-13 DIAGNOSIS — Z833 Family history of diabetes mellitus: Secondary | ICD-10-CM | POA: Diagnosis not present

## 2016-06-13 DIAGNOSIS — K219 Gastro-esophageal reflux disease without esophagitis: Secondary | ICD-10-CM | POA: Diagnosis not present

## 2016-06-13 DIAGNOSIS — C50412 Malignant neoplasm of upper-outer quadrant of left female breast: Secondary | ICD-10-CM | POA: Diagnosis not present

## 2016-06-13 DIAGNOSIS — Z79899 Other long term (current) drug therapy: Secondary | ICD-10-CM | POA: Diagnosis not present

## 2016-06-13 DIAGNOSIS — Z510 Encounter for antineoplastic radiation therapy: Secondary | ICD-10-CM | POA: Diagnosis not present

## 2016-06-14 ENCOUNTER — Ambulatory Visit
Admission: RE | Admit: 2016-06-14 | Discharge: 2016-06-14 | Disposition: A | Discharge status: Home / Self Care | Payer: BLUE CROSS/BLUE SHIELD | Source: Ambulatory Visit | Attending: Radiation Oncology | Admitting: Radiation Oncology

## 2016-06-14 DIAGNOSIS — C50412 Malignant neoplasm of upper-outer quadrant of left female breast: Secondary | ICD-10-CM | POA: Diagnosis not present

## 2016-06-14 DIAGNOSIS — Z833 Family history of diabetes mellitus: Secondary | ICD-10-CM | POA: Diagnosis not present

## 2016-06-14 DIAGNOSIS — Z510 Encounter for antineoplastic radiation therapy: Secondary | ICD-10-CM | POA: Diagnosis not present

## 2016-06-14 DIAGNOSIS — K219 Gastro-esophageal reflux disease without esophagitis: Secondary | ICD-10-CM | POA: Diagnosis not present

## 2016-06-14 DIAGNOSIS — Z79899 Other long term (current) drug therapy: Secondary | ICD-10-CM | POA: Diagnosis not present

## 2016-06-14 DIAGNOSIS — Z9221 Personal history of antineoplastic chemotherapy: Secondary | ICD-10-CM | POA: Diagnosis not present

## 2016-06-14 DIAGNOSIS — Z8249 Family history of ischemic heart disease and other diseases of the circulatory system: Secondary | ICD-10-CM | POA: Diagnosis not present

## 2016-06-14 DIAGNOSIS — Z170 Estrogen receptor positive status [ER+]: Secondary | ICD-10-CM | POA: Diagnosis not present

## 2016-06-15 ENCOUNTER — Ambulatory Visit
Admission: RE | Admit: 2016-06-15 | Discharge: 2016-06-15 | Disposition: A | Discharge status: Home / Self Care | Payer: BLUE CROSS/BLUE SHIELD | Source: Ambulatory Visit | Attending: Radiation Oncology | Admitting: Radiation Oncology

## 2016-06-15 DIAGNOSIS — Z9221 Personal history of antineoplastic chemotherapy: Secondary | ICD-10-CM | POA: Diagnosis not present

## 2016-06-15 DIAGNOSIS — Z8249 Family history of ischemic heart disease and other diseases of the circulatory system: Secondary | ICD-10-CM | POA: Diagnosis not present

## 2016-06-15 DIAGNOSIS — Z510 Encounter for antineoplastic radiation therapy: Secondary | ICD-10-CM | POA: Diagnosis not present

## 2016-06-15 DIAGNOSIS — C50412 Malignant neoplasm of upper-outer quadrant of left female breast: Secondary | ICD-10-CM | POA: Diagnosis not present

## 2016-06-15 DIAGNOSIS — Z79899 Other long term (current) drug therapy: Secondary | ICD-10-CM | POA: Diagnosis not present

## 2016-06-15 DIAGNOSIS — K219 Gastro-esophageal reflux disease without esophagitis: Secondary | ICD-10-CM | POA: Diagnosis not present

## 2016-06-15 DIAGNOSIS — Z170 Estrogen receptor positive status [ER+]: Secondary | ICD-10-CM | POA: Diagnosis not present

## 2016-06-15 DIAGNOSIS — Z833 Family history of diabetes mellitus: Secondary | ICD-10-CM | POA: Diagnosis not present

## 2016-06-15 MED ORDER — ALRA NON-METALLIC DEODORANT (RAD-ONC)
1.0000 | Freq: Once | TOPICAL | Status: AC
Start: 2016-06-15 — End: 2016-06-15
  Administered 2016-06-15: 1 via TOPICAL

## 2016-06-15 MED ORDER — RADIAPLEXRX EX GEL
Freq: Once | CUTANEOUS | Status: AC
  Administered 2016-06-15: 16:00:00 via TOPICAL

## 2016-06-15 NOTE — Progress Notes (Signed)
Pt here for patient teaching.  Pt given Radiation and You booklet, skin care instructions, Alra deodorant and Radiaplex gel. Pt reports they have not watched the Radiation Therapy Education video, but was given the link to watch at home.  Reviewed areas of pertinence such as fatigue, skin changes, breast tenderness, breast swelling, cough, shortness of breath, earaches and taste changes . Pt able to give teach back of to pat skin, use unscented/gentle soap and drink plenty of water,apply Radiaplex bid, avoid applying anything to skin within 4 hours of treatment, avoid wearing an under wire bra and to use an electric razor if they must shave. Pt verbalizes understanding of information given and will contact nursing with any questions or concerns.     [email or web address]

## 2016-06-16 ENCOUNTER — Ambulatory Visit
Admission: RE | Admit: 2016-06-16 | Discharge: 2016-06-16 | Disposition: A | Discharge status: Home / Self Care | Payer: BLUE CROSS/BLUE SHIELD | Source: Ambulatory Visit | Attending: Radiation Oncology | Admitting: Radiation Oncology

## 2016-06-16 DIAGNOSIS — Z510 Encounter for antineoplastic radiation therapy: Secondary | ICD-10-CM | POA: Diagnosis not present

## 2016-06-16 DIAGNOSIS — Z9221 Personal history of antineoplastic chemotherapy: Secondary | ICD-10-CM | POA: Diagnosis not present

## 2016-06-16 DIAGNOSIS — C50412 Malignant neoplasm of upper-outer quadrant of left female breast: Secondary | ICD-10-CM | POA: Diagnosis not present

## 2016-06-16 DIAGNOSIS — Z833 Family history of diabetes mellitus: Secondary | ICD-10-CM | POA: Diagnosis not present

## 2016-06-16 DIAGNOSIS — H10413 Chronic giant papillary conjunctivitis, bilateral: Secondary | ICD-10-CM | POA: Diagnosis not present

## 2016-06-16 DIAGNOSIS — Z79899 Other long term (current) drug therapy: Secondary | ICD-10-CM | POA: Diagnosis not present

## 2016-06-16 DIAGNOSIS — Z170 Estrogen receptor positive status [ER+]: Secondary | ICD-10-CM | POA: Diagnosis not present

## 2016-06-16 DIAGNOSIS — K219 Gastro-esophageal reflux disease without esophagitis: Secondary | ICD-10-CM | POA: Diagnosis not present

## 2016-06-16 DIAGNOSIS — Z8249 Family history of ischemic heart disease and other diseases of the circulatory system: Secondary | ICD-10-CM | POA: Diagnosis not present

## 2016-06-19 ENCOUNTER — Ambulatory Visit
Admission: RE | Admit: 2016-06-19 | Discharge: 2016-06-19 | Disposition: A | Discharge status: Home / Self Care | Payer: BLUE CROSS/BLUE SHIELD | Source: Ambulatory Visit | Attending: Radiation Oncology | Admitting: Radiation Oncology

## 2016-06-19 ENCOUNTER — Encounter: Payer: Self-pay | Admitting: Radiation Oncology

## 2016-06-19 VITALS — BP 113/84 | HR 79 | Temp 98.2°F | Ht 59.0 in | Wt 131.0 lb

## 2016-06-19 DIAGNOSIS — C50412 Malignant neoplasm of upper-outer quadrant of left female breast: Secondary | ICD-10-CM | POA: Diagnosis not present

## 2016-06-19 DIAGNOSIS — Z9221 Personal history of antineoplastic chemotherapy: Secondary | ICD-10-CM | POA: Diagnosis not present

## 2016-06-19 DIAGNOSIS — Z833 Family history of diabetes mellitus: Secondary | ICD-10-CM | POA: Diagnosis not present

## 2016-06-19 DIAGNOSIS — Z79899 Other long term (current) drug therapy: Secondary | ICD-10-CM | POA: Diagnosis not present

## 2016-06-19 DIAGNOSIS — Z170 Estrogen receptor positive status [ER+]: Secondary | ICD-10-CM | POA: Diagnosis not present

## 2016-06-19 DIAGNOSIS — Z8249 Family history of ischemic heart disease and other diseases of the circulatory system: Secondary | ICD-10-CM | POA: Diagnosis not present

## 2016-06-19 DIAGNOSIS — Z510 Encounter for antineoplastic radiation therapy: Secondary | ICD-10-CM | POA: Diagnosis not present

## 2016-06-19 DIAGNOSIS — K219 Gastro-esophageal reflux disease without esophagitis: Secondary | ICD-10-CM | POA: Diagnosis not present

## 2016-06-19 NOTE — Progress Notes (Signed)
    Weekly Management Note:  Outpatient    ICD-9-CM ICD-10-CM   1. Breast cancer of upper-outer quadrant of left female breast (HCC) 174.4 C50.412     Current Dose:  8 Gy  Projected Dose: 60 Gy   Narrative:  The patient presents for routine under treatment assessment.  CBCT/MVCT images/Port film x-rays were reviewed.  The chart was checked. No new issues other than itching  Physical Findings:  height is 4\' 11"  (1.499 m) and weight is 131 lb (59.4 kg). Her temperature is 98.2 F (36.8 C). Her blood pressure is 113/84 and her pulse is 79.   Wt Readings from Last 3 Encounters:  06/19/16 131 lb (59.4 kg)  05/31/16 130 lb 4.8 oz (59.1 kg)  05/25/16 127 lb (57.6 kg)   No skin irritation over left breast  Impression:  The patient is tolerating radiotherapy.  Plan:  Continue radiotherapy as planned.  Apply hydrocortisone 1% cream prn itching   ________________________________   Eppie Gibson, M.D.

## 2016-06-19 NOTE — Progress Notes (Signed)
Ms. Heather [name] is here for her 4th fraction of radiation to her Left Breast. She denies pain or fatigue.  She does report some itching over her Left Breast which is bothersome to her. The skin over her Left Breast is normal appearing and she is using the Radiaplex cream twice daily as directed.   BP 113/84   Pulse 79   Temp 98.2 F (36.8 C)   Ht 4\' 11"  (1.499 m)   Wt 131 lb (59.4 kg)   BMI 26.46 kg/m

## 2016-06-20 ENCOUNTER — Ambulatory Visit
Admission: RE | Admit: 2016-06-20 | Discharge: 2016-06-20 | Disposition: A | Discharge status: Home / Self Care | Payer: BLUE CROSS/BLUE SHIELD | Source: Ambulatory Visit | Attending: Radiation Oncology | Admitting: Radiation Oncology

## 2016-06-20 DIAGNOSIS — Z510 Encounter for antineoplastic radiation therapy: Secondary | ICD-10-CM | POA: Diagnosis not present

## 2016-06-20 DIAGNOSIS — Z8249 Family history of ischemic heart disease and other diseases of the circulatory system: Secondary | ICD-10-CM | POA: Diagnosis not present

## 2016-06-20 DIAGNOSIS — Z79899 Other long term (current) drug therapy: Secondary | ICD-10-CM | POA: Diagnosis not present

## 2016-06-20 DIAGNOSIS — Z170 Estrogen receptor positive status [ER+]: Secondary | ICD-10-CM | POA: Diagnosis not present

## 2016-06-20 DIAGNOSIS — C50412 Malignant neoplasm of upper-outer quadrant of left female breast: Secondary | ICD-10-CM | POA: Diagnosis not present

## 2016-06-20 DIAGNOSIS — Z9221 Personal history of antineoplastic chemotherapy: Secondary | ICD-10-CM | POA: Diagnosis not present

## 2016-06-20 DIAGNOSIS — K219 Gastro-esophageal reflux disease without esophagitis: Secondary | ICD-10-CM | POA: Diagnosis not present

## 2016-06-20 DIAGNOSIS — Z833 Family history of diabetes mellitus: Secondary | ICD-10-CM | POA: Diagnosis not present

## 2016-06-21 ENCOUNTER — Ambulatory Visit
Admission: RE | Admit: 2016-06-21 | Discharge: 2016-06-21 | Disposition: A | Discharge status: Home / Self Care | Payer: BLUE CROSS/BLUE SHIELD | Source: Ambulatory Visit | Attending: Radiation Oncology | Admitting: Radiation Oncology

## 2016-06-21 DIAGNOSIS — Z170 Estrogen receptor positive status [ER+]: Secondary | ICD-10-CM | POA: Diagnosis not present

## 2016-06-21 DIAGNOSIS — C50412 Malignant neoplasm of upper-outer quadrant of left female breast: Secondary | ICD-10-CM | POA: Diagnosis not present

## 2016-06-21 DIAGNOSIS — Z79899 Other long term (current) drug therapy: Secondary | ICD-10-CM | POA: Diagnosis not present

## 2016-06-21 DIAGNOSIS — Z833 Family history of diabetes mellitus: Secondary | ICD-10-CM | POA: Diagnosis not present

## 2016-06-21 DIAGNOSIS — Z9221 Personal history of antineoplastic chemotherapy: Secondary | ICD-10-CM | POA: Diagnosis not present

## 2016-06-21 DIAGNOSIS — Z510 Encounter for antineoplastic radiation therapy: Secondary | ICD-10-CM | POA: Diagnosis not present

## 2016-06-21 DIAGNOSIS — Z8249 Family history of ischemic heart disease and other diseases of the circulatory system: Secondary | ICD-10-CM | POA: Diagnosis not present

## 2016-06-21 DIAGNOSIS — K219 Gastro-esophageal reflux disease without esophagitis: Secondary | ICD-10-CM | POA: Diagnosis not present

## 2016-06-22 ENCOUNTER — Ambulatory Visit
Admission: RE | Admit: 2016-06-22 | Discharge: 2016-06-22 | Disposition: A | Discharge status: Home / Self Care | Payer: BLUE CROSS/BLUE SHIELD | Source: Ambulatory Visit | Attending: Radiation Oncology | Admitting: Radiation Oncology

## 2016-06-22 DIAGNOSIS — Z8249 Family history of ischemic heart disease and other diseases of the circulatory system: Secondary | ICD-10-CM | POA: Diagnosis not present

## 2016-06-22 DIAGNOSIS — Z170 Estrogen receptor positive status [ER+]: Secondary | ICD-10-CM | POA: Diagnosis not present

## 2016-06-22 DIAGNOSIS — Z833 Family history of diabetes mellitus: Secondary | ICD-10-CM | POA: Diagnosis not present

## 2016-06-22 DIAGNOSIS — Z79899 Other long term (current) drug therapy: Secondary | ICD-10-CM | POA: Diagnosis not present

## 2016-06-22 DIAGNOSIS — Z510 Encounter for antineoplastic radiation therapy: Secondary | ICD-10-CM | POA: Diagnosis not present

## 2016-06-22 DIAGNOSIS — K219 Gastro-esophageal reflux disease without esophagitis: Secondary | ICD-10-CM | POA: Diagnosis not present

## 2016-06-22 DIAGNOSIS — C50412 Malignant neoplasm of upper-outer quadrant of left female breast: Secondary | ICD-10-CM | POA: Diagnosis not present

## 2016-06-22 DIAGNOSIS — Z9221 Personal history of antineoplastic chemotherapy: Secondary | ICD-10-CM | POA: Diagnosis not present

## 2016-06-23 ENCOUNTER — Telehealth: Payer: Self-pay | Admitting: *Deleted

## 2016-06-23 ENCOUNTER — Ambulatory Visit
Admission: RE | Admit: 2016-06-23 | Discharge: 2016-06-23 | Disposition: A | Discharge status: Home / Self Care | Payer: BLUE CROSS/BLUE SHIELD | Source: Ambulatory Visit | Attending: Radiation Oncology | Admitting: Radiation Oncology

## 2016-06-23 DIAGNOSIS — Z833 Family history of diabetes mellitus: Secondary | ICD-10-CM | POA: Diagnosis not present

## 2016-06-23 DIAGNOSIS — C50412 Malignant neoplasm of upper-outer quadrant of left female breast: Secondary | ICD-10-CM | POA: Diagnosis not present

## 2016-06-23 DIAGNOSIS — Z510 Encounter for antineoplastic radiation therapy: Secondary | ICD-10-CM | POA: Diagnosis not present

## 2016-06-23 DIAGNOSIS — Z79899 Other long term (current) drug therapy: Secondary | ICD-10-CM | POA: Diagnosis not present

## 2016-06-23 DIAGNOSIS — Z9221 Personal history of antineoplastic chemotherapy: Secondary | ICD-10-CM | POA: Diagnosis not present

## 2016-06-23 DIAGNOSIS — K219 Gastro-esophageal reflux disease without esophagitis: Secondary | ICD-10-CM | POA: Diagnosis not present

## 2016-06-23 DIAGNOSIS — Z8249 Family history of ischemic heart disease and other diseases of the circulatory system: Secondary | ICD-10-CM | POA: Diagnosis not present

## 2016-06-23 DIAGNOSIS — Z170 Estrogen receptor positive status [ER+]: Secondary | ICD-10-CM | POA: Diagnosis not present

## 2016-06-23 NOTE — Telephone Encounter (Signed)
  Oncology Nurse Navigator Documentation  Navigator Location: [facility] (06/23/16 1300) Navigator Encounter Type: Treatment (06/23/16 1300)           Patient Visit Type: [id number] (06/23/16 1300) Treatment Phase: First Radiation Tx (06/23/16 1300)                            Time Spent with Patient: 15 (06/23/16 1300)

## 2016-06-26 ENCOUNTER — Ambulatory Visit
Admission: RE | Admit: 2016-06-26 | Discharge: 2016-06-26 | Disposition: A | Discharge status: Home / Self Care | Payer: BLUE CROSS/BLUE SHIELD | Source: Ambulatory Visit | Attending: Radiation Oncology | Admitting: Radiation Oncology

## 2016-06-26 ENCOUNTER — Encounter: Payer: Self-pay | Admitting: Radiation Oncology

## 2016-06-26 VITALS — BP 107/76 | HR 83 | Temp 97.8°F | Ht 59.0 in | Wt 129.7 lb

## 2016-06-26 DIAGNOSIS — Z79899 Other long term (current) drug therapy: Secondary | ICD-10-CM | POA: Diagnosis not present

## 2016-06-26 DIAGNOSIS — Z170 Estrogen receptor positive status [ER+]: Secondary | ICD-10-CM | POA: Diagnosis not present

## 2016-06-26 DIAGNOSIS — Z8249 Family history of ischemic heart disease and other diseases of the circulatory system: Secondary | ICD-10-CM | POA: Diagnosis not present

## 2016-06-26 DIAGNOSIS — Z510 Encounter for antineoplastic radiation therapy: Secondary | ICD-10-CM | POA: Diagnosis not present

## 2016-06-26 DIAGNOSIS — Z9221 Personal history of antineoplastic chemotherapy: Secondary | ICD-10-CM | POA: Diagnosis not present

## 2016-06-26 DIAGNOSIS — C50412 Malignant neoplasm of upper-outer quadrant of left female breast: Secondary | ICD-10-CM | POA: Diagnosis not present

## 2016-06-26 DIAGNOSIS — Z833 Family history of diabetes mellitus: Secondary | ICD-10-CM | POA: Diagnosis not present

## 2016-06-26 DIAGNOSIS — K219 Gastro-esophageal reflux disease without esophagitis: Secondary | ICD-10-CM | POA: Diagnosis not present

## 2016-06-26 NOTE — Progress Notes (Addendum)
Heather [name] has received 9 fractions to her right breast.  At this time, she denies any pain in her breast.  Do not note any changes in pigmentation of her left breast as compared to her right breast.  Denies any fatigue.  BP 107/76 (BP Location: Right Arm, Patient Position: Sitting, Cuff Size: Normal)   Pulse 83   Temp 97.8 F (36.6 C) (Oral)   Ht 4\' 11"  (1.499 m)   Wt 129 lb 11.2 oz (58.8 kg)   BMI 26.20 kg/m    Wt Readings from Last 3 Encounters:  06/26/16 129 lb 11.2 oz (58.8 kg)  06/19/16 131 lb (59.4 kg)  05/31/16 130 lb 4.8 oz (59.1 kg)

## 2016-06-26 NOTE — Progress Notes (Signed)
    Weekly Management Note:  Outpatient    ICD-9-CM ICD-10-CM   1. Breast cancer of upper-outer quadrant of left female breast (HCC) 174.4 C50.412     Current Dose:  18 Gy  Projected Dose: 60 Gy   Narrative:  The patient presents for routine under treatment assessment.  CBCT/MVCT images/Port film x-rays were reviewed.  The chart was checked. No new issues    Physical Findings:  height is 4\' 11"  (1.499 m) and weight is 129 lb 11.2 oz (58.8 kg). Her oral temperature is 97.8 F (36.6 C). Her blood pressure is 107/76 and her pulse is 83.   Wt Readings from Last 3 Encounters:  06/26/16 129 lb 11.2 oz (58.8 kg)  06/19/16 131 lb (59.4 kg)  05/31/16 130 lb 4.8 oz (59.1 kg)   No skin irritation over left breast  Impression:  The patient is tolerating radiotherapy.  Plan:  Continue radiotherapy as planned.  Apply hydrocortisone 1% cream prn itching   ________________________________   Eppie Gibson, M.D.

## 2016-06-27 ENCOUNTER — Ambulatory Visit
Admission: RE | Admit: 2016-06-27 | Discharge: 2016-06-27 | Disposition: A | Discharge status: Home / Self Care | Payer: BLUE CROSS/BLUE SHIELD | Source: Ambulatory Visit | Attending: Radiation Oncology | Admitting: Radiation Oncology

## 2016-06-27 DIAGNOSIS — Z9221 Personal history of antineoplastic chemotherapy: Secondary | ICD-10-CM | POA: Diagnosis not present

## 2016-06-27 DIAGNOSIS — Z510 Encounter for antineoplastic radiation therapy: Secondary | ICD-10-CM | POA: Diagnosis not present

## 2016-06-27 DIAGNOSIS — Z79899 Other long term (current) drug therapy: Secondary | ICD-10-CM | POA: Diagnosis not present

## 2016-06-27 DIAGNOSIS — K219 Gastro-esophageal reflux disease without esophagitis: Secondary | ICD-10-CM | POA: Diagnosis not present

## 2016-06-27 DIAGNOSIS — Z170 Estrogen receptor positive status [ER+]: Secondary | ICD-10-CM | POA: Diagnosis not present

## 2016-06-27 DIAGNOSIS — C50412 Malignant neoplasm of upper-outer quadrant of left female breast: Secondary | ICD-10-CM | POA: Diagnosis not present

## 2016-06-27 DIAGNOSIS — Z8249 Family history of ischemic heart disease and other diseases of the circulatory system: Secondary | ICD-10-CM | POA: Diagnosis not present

## 2016-06-27 DIAGNOSIS — Z833 Family history of diabetes mellitus: Secondary | ICD-10-CM | POA: Diagnosis not present

## 2016-06-28 ENCOUNTER — Telehealth: Payer: Self-pay

## 2016-06-28 ENCOUNTER — Ambulatory Visit
Admission: RE | Admit: 2016-06-28 | Discharge: 2016-06-28 | Disposition: A | Discharge status: Home / Self Care | Payer: BLUE CROSS/BLUE SHIELD | Source: Ambulatory Visit | Attending: Radiation Oncology | Admitting: Radiation Oncology

## 2016-06-28 DIAGNOSIS — Z170 Estrogen receptor positive status [ER+]: Secondary | ICD-10-CM | POA: Diagnosis not present

## 2016-06-28 DIAGNOSIS — Z833 Family history of diabetes mellitus: Secondary | ICD-10-CM | POA: Diagnosis not present

## 2016-06-28 DIAGNOSIS — Z9221 Personal history of antineoplastic chemotherapy: Secondary | ICD-10-CM | POA: Diagnosis not present

## 2016-06-28 DIAGNOSIS — C50412 Malignant neoplasm of upper-outer quadrant of left female breast: Secondary | ICD-10-CM | POA: Diagnosis not present

## 2016-06-28 DIAGNOSIS — K219 Gastro-esophageal reflux disease without esophagitis: Secondary | ICD-10-CM | POA: Diagnosis not present

## 2016-06-28 DIAGNOSIS — Z8249 Family history of ischemic heart disease and other diseases of the circulatory system: Secondary | ICD-10-CM | POA: Diagnosis not present

## 2016-06-28 DIAGNOSIS — Z510 Encounter for antineoplastic radiation therapy: Secondary | ICD-10-CM | POA: Diagnosis not present

## 2016-06-28 DIAGNOSIS — Z79899 Other long term (current) drug therapy: Secondary | ICD-10-CM | POA: Diagnosis not present

## 2016-06-28 NOTE — Telephone Encounter (Signed)
I called to inform Heather [name] that she has had an exposure to the Flu virus while receiving Radiation Therapy. I offered to call her in a prescription for Tamiflu as a preventative and she agreed. A prescription for Tamiflu was called into her pharmacy in [place] for her to pick up.

## 2016-06-29 ENCOUNTER — Ambulatory Visit
Admission: RE | Admit: 2016-06-29 | Discharge: 2016-06-29 | Disposition: A | Discharge status: Home / Self Care | Payer: BLUE CROSS/BLUE SHIELD | Source: Ambulatory Visit | Attending: Radiation Oncology | Admitting: Radiation Oncology

## 2016-06-29 DIAGNOSIS — Z510 Encounter for antineoplastic radiation therapy: Secondary | ICD-10-CM | POA: Diagnosis not present

## 2016-06-29 DIAGNOSIS — Z833 Family history of diabetes mellitus: Secondary | ICD-10-CM | POA: Diagnosis not present

## 2016-06-29 DIAGNOSIS — Z170 Estrogen receptor positive status [ER+]: Secondary | ICD-10-CM | POA: Diagnosis not present

## 2016-06-29 DIAGNOSIS — Z79899 Other long term (current) drug therapy: Secondary | ICD-10-CM | POA: Diagnosis not present

## 2016-06-29 DIAGNOSIS — Z9221 Personal history of antineoplastic chemotherapy: Secondary | ICD-10-CM | POA: Diagnosis not present

## 2016-06-29 DIAGNOSIS — C50412 Malignant neoplasm of upper-outer quadrant of left female breast: Secondary | ICD-10-CM | POA: Diagnosis not present

## 2016-06-29 DIAGNOSIS — K219 Gastro-esophageal reflux disease without esophagitis: Secondary | ICD-10-CM | POA: Diagnosis not present

## 2016-06-29 DIAGNOSIS — Z8249 Family history of ischemic heart disease and other diseases of the circulatory system: Secondary | ICD-10-CM | POA: Diagnosis not present

## 2016-06-30 ENCOUNTER — Ambulatory Visit
Admission: RE | Admit: 2016-06-30 | Discharge: 2016-06-30 | Disposition: A | Discharge status: Home / Self Care | Payer: BLUE CROSS/BLUE SHIELD | Source: Ambulatory Visit | Attending: Radiation Oncology | Admitting: Radiation Oncology

## 2016-06-30 DIAGNOSIS — Z8249 Family history of ischemic heart disease and other diseases of the circulatory system: Secondary | ICD-10-CM | POA: Diagnosis not present

## 2016-06-30 DIAGNOSIS — K219 Gastro-esophageal reflux disease without esophagitis: Secondary | ICD-10-CM | POA: Diagnosis not present

## 2016-06-30 DIAGNOSIS — Z9221 Personal history of antineoplastic chemotherapy: Secondary | ICD-10-CM | POA: Diagnosis not present

## 2016-06-30 DIAGNOSIS — Z833 Family history of diabetes mellitus: Secondary | ICD-10-CM | POA: Diagnosis not present

## 2016-06-30 DIAGNOSIS — Z510 Encounter for antineoplastic radiation therapy: Secondary | ICD-10-CM | POA: Diagnosis not present

## 2016-06-30 DIAGNOSIS — Z170 Estrogen receptor positive status [ER+]: Secondary | ICD-10-CM | POA: Diagnosis not present

## 2016-06-30 DIAGNOSIS — Z79899 Other long term (current) drug therapy: Secondary | ICD-10-CM | POA: Diagnosis not present

## 2016-06-30 DIAGNOSIS — C50412 Malignant neoplasm of upper-outer quadrant of left female breast: Secondary | ICD-10-CM | POA: Diagnosis not present

## 2016-07-04 ENCOUNTER — Ambulatory Visit
Admission: RE | Admit: 2016-07-04 | Discharge: 2016-07-04 | Disposition: A | Discharge status: Home / Self Care | Payer: BLUE CROSS/BLUE SHIELD | Source: Ambulatory Visit | Attending: Radiation Oncology | Admitting: Radiation Oncology

## 2016-07-04 DIAGNOSIS — Z79899 Other long term (current) drug therapy: Secondary | ICD-10-CM | POA: Diagnosis not present

## 2016-07-04 DIAGNOSIS — Z9221 Personal history of antineoplastic chemotherapy: Secondary | ICD-10-CM | POA: Diagnosis not present

## 2016-07-04 DIAGNOSIS — Z833 Family history of diabetes mellitus: Secondary | ICD-10-CM | POA: Diagnosis not present

## 2016-07-04 DIAGNOSIS — Z510 Encounter for antineoplastic radiation therapy: Secondary | ICD-10-CM | POA: Diagnosis not present

## 2016-07-04 DIAGNOSIS — C50412 Malignant neoplasm of upper-outer quadrant of left female breast: Secondary | ICD-10-CM | POA: Diagnosis not present

## 2016-07-04 DIAGNOSIS — K219 Gastro-esophageal reflux disease without esophagitis: Secondary | ICD-10-CM | POA: Diagnosis not present

## 2016-07-04 DIAGNOSIS — Z8249 Family history of ischemic heart disease and other diseases of the circulatory system: Secondary | ICD-10-CM | POA: Diagnosis not present

## 2016-07-04 DIAGNOSIS — Z170 Estrogen receptor positive status [ER+]: Secondary | ICD-10-CM | POA: Diagnosis not present

## 2016-07-05 ENCOUNTER — Ambulatory Visit
Admission: RE | Admit: 2016-07-05 | Discharge: 2016-07-05 | Disposition: A | Discharge status: Home / Self Care | Payer: BLUE CROSS/BLUE SHIELD | Source: Ambulatory Visit | Attending: Radiation Oncology | Admitting: Radiation Oncology

## 2016-07-05 ENCOUNTER — Encounter: Payer: Self-pay | Admitting: Radiation Oncology

## 2016-07-05 VITALS — BP 111/85 | HR 80 | Temp 97.9°F | Ht 59.0 in | Wt 131.0 lb

## 2016-07-05 DIAGNOSIS — Z8249 Family history of ischemic heart disease and other diseases of the circulatory system: Secondary | ICD-10-CM | POA: Diagnosis not present

## 2016-07-05 DIAGNOSIS — Z79899 Other long term (current) drug therapy: Secondary | ICD-10-CM | POA: Diagnosis not present

## 2016-07-05 DIAGNOSIS — Z833 Family history of diabetes mellitus: Secondary | ICD-10-CM | POA: Diagnosis not present

## 2016-07-05 DIAGNOSIS — K219 Gastro-esophageal reflux disease without esophagitis: Secondary | ICD-10-CM | POA: Diagnosis not present

## 2016-07-05 DIAGNOSIS — C50412 Malignant neoplasm of upper-outer quadrant of left female breast: Secondary | ICD-10-CM | POA: Diagnosis not present

## 2016-07-05 DIAGNOSIS — Z170 Estrogen receptor positive status [ER+]: Secondary | ICD-10-CM | POA: Diagnosis not present

## 2016-07-05 DIAGNOSIS — Z9221 Personal history of antineoplastic chemotherapy: Secondary | ICD-10-CM | POA: Diagnosis not present

## 2016-07-05 DIAGNOSIS — Z510 Encounter for antineoplastic radiation therapy: Secondary | ICD-10-CM | POA: Diagnosis not present

## 2016-07-05 NOTE — Progress Notes (Signed)
Rosse has completed 15 fractions to her left breast.  She reports having soreness/pain in her left shoulder that started this week.  She is rating it at a 3/10.  She denies having fatigue.  She is using radiaplex ad directed  and hydrocortisone 1% cream for itching as needed.  The skin on her left breast has hyperpigmentation  BP 111/85 (BP Location: Right Arm, Patient Position: Sitting)   Pulse 80   Temp 97.9 F (36.6 C) (Oral)   Ht 4\' 11"  (1.499 m)   Wt 131 lb (59.4 kg)   SpO2 100%   BMI 26.46 kg/m    Wt Readings from Last 3 Encounters:  07/05/16 131 lb (59.4 kg)  06/26/16 129 lb 11.2 oz (58.8 kg)  06/19/16 131 lb (59.4 kg)

## 2016-07-05 NOTE — Progress Notes (Signed)
    Weekly Management Note:  Outpatient    ICD-9-CM ICD-10-CM   1. [name] cancer of upper-outer quadrant of left female [name] (HCC) 174.4 C50.412     Current Dose:  30 Gy  Projected Dose: 60 Gy   Narrative:  The patient presents for routine under treatment assessment.  CBCT/MVCT images/Port film x-rays were reviewed.  The chart was checked. No new issues. Heather [name]. She reports soreness/pain in her left shoulder. The pain began at the start of this week. She rates the pain a 3/10. She denies having fatigue. She is using Radiaplex as directed and hydrocortisone 1% cream for itching as needed. The skin on her left [name] has hyperpigmentation.  Physical Findings:  height is 4\' 11"  (1.499 m) and weight is 131 lb (59.4 kg). Her oral temperature is 97.9 F (36.6 C). Her blood pressure is 111/85 and her pulse is 80. Her oxygen saturation is 100%.   Wt Readings from Last 3 Encounters:  07/05/16 131 lb (59.4 kg)  06/26/16 129 lb 11.2 oz (58.8 kg)  06/19/16 131 lb (59.4 kg)   Slight hyperpigmentation over left [name]  Impression:  The patient is tolerating radiotherapy.  Plan:  Continue radiotherapy as planned. Suggested Advil/ibuprofen for [name] pains.  ________________________________   Heather [name], M.D.  This document serves as a record of services personally performed by Heather Gibson, MD. It was created on her behalf by Bethann Humble, a trained medical scribe. The creation of this record is based on the scribe's personal observations and the provider's statements to them. This document has been checked and approved by the attending provider.

## 2016-07-06 ENCOUNTER — Ambulatory Visit
Admission: RE | Admit: 2016-07-06 | Discharge: 2016-07-06 | Disposition: A | Discharge status: Home / Self Care | Payer: BLUE CROSS/BLUE SHIELD | Source: Ambulatory Visit | Attending: Radiation Oncology | Admitting: Radiation Oncology

## 2016-07-06 DIAGNOSIS — K219 Gastro-esophageal reflux disease without esophagitis: Secondary | ICD-10-CM | POA: Diagnosis not present

## 2016-07-06 DIAGNOSIS — Z79899 Other long term (current) drug therapy: Secondary | ICD-10-CM | POA: Diagnosis not present

## 2016-07-06 DIAGNOSIS — Z8249 Family history of ischemic heart disease and other diseases of the circulatory system: Secondary | ICD-10-CM | POA: Diagnosis not present

## 2016-07-06 DIAGNOSIS — C50412 Malignant neoplasm of upper-outer quadrant of left female breast: Secondary | ICD-10-CM | POA: Diagnosis not present

## 2016-07-06 DIAGNOSIS — Z833 Family history of diabetes mellitus: Secondary | ICD-10-CM | POA: Diagnosis not present

## 2016-07-06 DIAGNOSIS — Z170 Estrogen receptor positive status [ER+]: Secondary | ICD-10-CM | POA: Diagnosis not present

## 2016-07-06 DIAGNOSIS — Z510 Encounter for antineoplastic radiation therapy: Secondary | ICD-10-CM | POA: Diagnosis not present

## 2016-07-06 DIAGNOSIS — Z9221 Personal history of antineoplastic chemotherapy: Secondary | ICD-10-CM | POA: Diagnosis not present

## 2016-07-07 ENCOUNTER — Ambulatory Visit
Admission: RE | Admit: 2016-07-07 | Discharge: 2016-07-07 | Disposition: A | Discharge status: Home / Self Care | Payer: BLUE CROSS/BLUE SHIELD | Source: Ambulatory Visit | Attending: Radiation Oncology | Admitting: Radiation Oncology

## 2016-07-07 DIAGNOSIS — Z170 Estrogen receptor positive status [ER+]: Secondary | ICD-10-CM | POA: Diagnosis not present

## 2016-07-07 DIAGNOSIS — Z833 Family history of diabetes mellitus: Secondary | ICD-10-CM | POA: Diagnosis not present

## 2016-07-07 DIAGNOSIS — C50412 Malignant neoplasm of upper-outer quadrant of left female breast: Secondary | ICD-10-CM | POA: Diagnosis not present

## 2016-07-07 DIAGNOSIS — K219 Gastro-esophageal reflux disease without esophagitis: Secondary | ICD-10-CM | POA: Diagnosis not present

## 2016-07-07 DIAGNOSIS — Z9221 Personal history of antineoplastic chemotherapy: Secondary | ICD-10-CM | POA: Diagnosis not present

## 2016-07-07 DIAGNOSIS — Z79899 Other long term (current) drug therapy: Secondary | ICD-10-CM | POA: Diagnosis not present

## 2016-07-07 DIAGNOSIS — Z510 Encounter for antineoplastic radiation therapy: Secondary | ICD-10-CM | POA: Diagnosis not present

## 2016-07-07 DIAGNOSIS — Z8249 Family history of ischemic heart disease and other diseases of the circulatory system: Secondary | ICD-10-CM | POA: Diagnosis not present

## 2016-07-10 ENCOUNTER — Ambulatory Visit
Admission: RE | Admit: 2016-07-10 | Discharge: 2016-07-10 | Disposition: A | Discharge status: Home / Self Care | Payer: BLUE CROSS/BLUE SHIELD | Source: Ambulatory Visit | Attending: Radiation Oncology | Admitting: Radiation Oncology

## 2016-07-10 DIAGNOSIS — Z170 Estrogen receptor positive status [ER+]: Secondary | ICD-10-CM | POA: Diagnosis not present

## 2016-07-10 DIAGNOSIS — K219 Gastro-esophageal reflux disease without esophagitis: Secondary | ICD-10-CM | POA: Diagnosis not present

## 2016-07-10 DIAGNOSIS — Z833 Family history of diabetes mellitus: Secondary | ICD-10-CM | POA: Diagnosis not present

## 2016-07-10 DIAGNOSIS — C50412 Malignant neoplasm of upper-outer quadrant of left female breast: Secondary | ICD-10-CM | POA: Diagnosis not present

## 2016-07-10 DIAGNOSIS — Z79899 Other long term (current) drug therapy: Secondary | ICD-10-CM | POA: Diagnosis not present

## 2016-07-10 DIAGNOSIS — Z8249 Family history of ischemic heart disease and other diseases of the circulatory system: Secondary | ICD-10-CM | POA: Diagnosis not present

## 2016-07-10 DIAGNOSIS — Z510 Encounter for antineoplastic radiation therapy: Secondary | ICD-10-CM | POA: Diagnosis not present

## 2016-07-10 DIAGNOSIS — Z9221 Personal history of antineoplastic chemotherapy: Secondary | ICD-10-CM | POA: Diagnosis not present

## 2016-07-11 ENCOUNTER — Ambulatory Visit
Admission: RE | Admit: 2016-07-11 | Discharge: 2016-07-11 | Disposition: A | Discharge status: Home / Self Care | Payer: BLUE CROSS/BLUE SHIELD | Source: Ambulatory Visit | Attending: Radiation Oncology | Admitting: Radiation Oncology

## 2016-07-11 DIAGNOSIS — Z9221 Personal history of antineoplastic chemotherapy: Secondary | ICD-10-CM | POA: Diagnosis not present

## 2016-07-11 DIAGNOSIS — Z8249 Family history of ischemic heart disease and other diseases of the circulatory system: Secondary | ICD-10-CM | POA: Diagnosis not present

## 2016-07-11 DIAGNOSIS — K219 Gastro-esophageal reflux disease without esophagitis: Secondary | ICD-10-CM | POA: Diagnosis not present

## 2016-07-11 DIAGNOSIS — Z833 Family history of diabetes mellitus: Secondary | ICD-10-CM | POA: Diagnosis not present

## 2016-07-11 DIAGNOSIS — C50412 Malignant neoplasm of upper-outer quadrant of left female breast: Secondary | ICD-10-CM | POA: Diagnosis not present

## 2016-07-11 DIAGNOSIS — Z510 Encounter for antineoplastic radiation therapy: Secondary | ICD-10-CM | POA: Diagnosis not present

## 2016-07-11 DIAGNOSIS — Z170 Estrogen receptor positive status [ER+]: Secondary | ICD-10-CM | POA: Diagnosis not present

## 2016-07-11 DIAGNOSIS — Z79899 Other long term (current) drug therapy: Secondary | ICD-10-CM | POA: Diagnosis not present

## 2016-07-12 ENCOUNTER — Ambulatory Visit
Admission: RE | Admit: 2016-07-12 | Discharge: 2016-07-12 | Disposition: A | Discharge status: Home / Self Care | Payer: BLUE CROSS/BLUE SHIELD | Source: Ambulatory Visit | Attending: Radiation Oncology | Admitting: Radiation Oncology

## 2016-07-12 ENCOUNTER — Encounter: Payer: Self-pay | Admitting: Radiation Oncology

## 2016-07-12 VITALS — BP 97/69 | HR 92 | Temp 97.7°F | Ht 59.0 in | Wt 133.2 lb

## 2016-07-12 DIAGNOSIS — Z833 Family history of diabetes mellitus: Secondary | ICD-10-CM | POA: Diagnosis not present

## 2016-07-12 DIAGNOSIS — Z8249 Family history of ischemic heart disease and other diseases of the circulatory system: Secondary | ICD-10-CM | POA: Diagnosis not present

## 2016-07-12 DIAGNOSIS — Z170 Estrogen receptor positive status [ER+]: Secondary | ICD-10-CM | POA: Diagnosis not present

## 2016-07-12 DIAGNOSIS — C50412 Malignant neoplasm of upper-outer quadrant of left female breast: Secondary | ICD-10-CM | POA: Diagnosis not present

## 2016-07-12 DIAGNOSIS — K219 Gastro-esophageal reflux disease without esophagitis: Secondary | ICD-10-CM | POA: Diagnosis not present

## 2016-07-12 DIAGNOSIS — Z510 Encounter for antineoplastic radiation therapy: Secondary | ICD-10-CM | POA: Diagnosis not present

## 2016-07-12 DIAGNOSIS — Z9221 Personal history of antineoplastic chemotherapy: Secondary | ICD-10-CM | POA: Diagnosis not present

## 2016-07-12 DIAGNOSIS — Z79899 Other long term (current) drug therapy: Secondary | ICD-10-CM | POA: Diagnosis not present

## 2016-07-12 NOTE — Progress Notes (Signed)
    Weekly Management Note:  Outpatient    ICD-9-CM ICD-10-CM   1. Breast cancer of upper-outer quadrant of left female breast (HCC) 174.4 C50.412     Current Dose:  40 Gy  Projected Dose: 60 Gy   Narrative:  The patient presents for routine under treatment assessment.  CBCT/MVCT images/Port film x-rays were reviewed.  The chart was checked. No new issues.  Heather [name] is here for her 20th fraction of radiation to her left breast. She denies pain, or fatigue. She has hyperpigmentation to her left breast. She also has hyperpigmentation underneath her left breast and her left axilla area. She is using Radiaplex and hydrocortisone to these areas.   Physical Findings:  height is 4\' 11"  (1.499 m) and weight is 133 lb 3.2 oz (60.4 kg). Her temperature is 97.7 F (36.5 C). Her blood pressure is 97/69 and her pulse is 92.   Wt Readings from Last 3 Encounters:  07/12/16 133 lb 3.2 oz (60.4 kg)  07/05/16 131 lb (59.4 kg)  06/26/16 129 lb 11.2 oz (58.8 kg)   Mild hyperpigmentation throughout the left breast and axilla.  Impression:  The patient is tolerating radiotherapy.  Plan:  Continue radiotherapy as planned.  ________________________________   Eppie Gibson, M.D.  This document serves as a record of services personally performed by Eppie Gibson, MD. It was created on her behalf by Bethann Humble, a trained medical scribe. The creation of this record is based on the scribe's personal observations and the provider's statements to them. This document has been checked and approved by the attending provider.

## 2016-07-12 NOTE — Progress Notes (Signed)
Heather [name] is here for her 20th fraction of radiation to her Left Breast. She denies pain, or fatigue. She has hyperpigmentation to her Left Breast. She also has hyperpigmentation underneath her Left Breast and her Left Axilla area. She is using Radiaplex and hydrocortisone to these areas.   BP 97/69   Pulse 92   Temp 97.7 F (36.5 C)   Ht 4\' 11"  (1.499 m)   Wt 133 lb 3.2 oz (60.4 kg)   BMI 26.90 kg/m    Wt Readings from Last 3 Encounters:  07/12/16 133 lb 3.2 oz (60.4 kg)  07/05/16 131 lb (59.4 kg)  06/26/16 129 lb 11.2 oz (58.8 kg)

## 2016-07-13 ENCOUNTER — Ambulatory Visit
Admission: RE | Admit: 2016-07-13 | Discharge: 2016-07-13 | Disposition: A | Discharge status: Home / Self Care | Payer: BLUE CROSS/BLUE SHIELD | Source: Ambulatory Visit | Attending: Radiation Oncology | Admitting: Radiation Oncology

## 2016-07-13 DIAGNOSIS — Z510 Encounter for antineoplastic radiation therapy: Secondary | ICD-10-CM | POA: Diagnosis not present

## 2016-07-13 DIAGNOSIS — Z9221 Personal history of antineoplastic chemotherapy: Secondary | ICD-10-CM | POA: Diagnosis not present

## 2016-07-13 DIAGNOSIS — Z8249 Family history of ischemic heart disease and other diseases of the circulatory system: Secondary | ICD-10-CM | POA: Diagnosis not present

## 2016-07-13 DIAGNOSIS — Z170 Estrogen receptor positive status [ER+]: Secondary | ICD-10-CM | POA: Diagnosis not present

## 2016-07-13 DIAGNOSIS — Z833 Family history of diabetes mellitus: Secondary | ICD-10-CM | POA: Diagnosis not present

## 2016-07-13 DIAGNOSIS — K219 Gastro-esophageal reflux disease without esophagitis: Secondary | ICD-10-CM | POA: Diagnosis not present

## 2016-07-13 DIAGNOSIS — Z79899 Other long term (current) drug therapy: Secondary | ICD-10-CM | POA: Diagnosis not present

## 2016-07-13 DIAGNOSIS — C50412 Malignant neoplasm of upper-outer quadrant of left female breast: Secondary | ICD-10-CM | POA: Diagnosis not present

## 2016-07-14 ENCOUNTER — Ambulatory Visit
Admission: RE | Admit: 2016-07-14 | Discharge: 2016-07-14 | Disposition: A | Discharge status: Home / Self Care | Payer: BLUE CROSS/BLUE SHIELD | Source: Ambulatory Visit | Attending: Radiation Oncology | Admitting: Radiation Oncology

## 2016-07-14 ENCOUNTER — Encounter: Payer: Self-pay | Admitting: Radiation Oncology

## 2016-07-14 DIAGNOSIS — Z833 Family history of diabetes mellitus: Secondary | ICD-10-CM | POA: Diagnosis not present

## 2016-07-14 DIAGNOSIS — Z79899 Other long term (current) drug therapy: Secondary | ICD-10-CM | POA: Diagnosis not present

## 2016-07-14 DIAGNOSIS — C50412 Malignant neoplasm of upper-outer quadrant of left female breast: Secondary | ICD-10-CM | POA: Diagnosis not present

## 2016-07-14 DIAGNOSIS — Z170 Estrogen receptor positive status [ER+]: Secondary | ICD-10-CM | POA: Diagnosis not present

## 2016-07-14 DIAGNOSIS — K219 Gastro-esophageal reflux disease without esophagitis: Secondary | ICD-10-CM | POA: Diagnosis not present

## 2016-07-14 DIAGNOSIS — Z9221 Personal history of antineoplastic chemotherapy: Secondary | ICD-10-CM | POA: Diagnosis not present

## 2016-07-14 DIAGNOSIS — Z8249 Family history of ischemic heart disease and other diseases of the circulatory system: Secondary | ICD-10-CM | POA: Diagnosis not present

## 2016-07-14 DIAGNOSIS — Z510 Encounter for antineoplastic radiation therapy: Secondary | ICD-10-CM | POA: Diagnosis not present

## 2016-07-17 ENCOUNTER — Ambulatory Visit
Admission: RE | Admit: 2016-07-17 | Discharge: 2016-07-17 | Disposition: A | Discharge status: Home / Self Care | Payer: BLUE CROSS/BLUE SHIELD | Source: Ambulatory Visit | Attending: Radiation Oncology | Admitting: Radiation Oncology

## 2016-07-17 ENCOUNTER — Encounter: Payer: Self-pay | Admitting: Radiation Oncology

## 2016-07-17 VITALS — BP 117/75 | HR 82 | Temp 97.7°F | Ht 59.0 in | Wt 132.5 lb

## 2016-07-17 DIAGNOSIS — Z510 Encounter for antineoplastic radiation therapy: Secondary | ICD-10-CM | POA: Diagnosis not present

## 2016-07-17 DIAGNOSIS — Z170 Estrogen receptor positive status [ER+]: Secondary | ICD-10-CM | POA: Diagnosis not present

## 2016-07-17 DIAGNOSIS — Z833 Family history of diabetes mellitus: Secondary | ICD-10-CM | POA: Diagnosis not present

## 2016-07-17 DIAGNOSIS — Z79899 Other long term (current) drug therapy: Secondary | ICD-10-CM | POA: Diagnosis not present

## 2016-07-17 DIAGNOSIS — Z8249 Family history of ischemic heart disease and other diseases of the circulatory system: Secondary | ICD-10-CM | POA: Diagnosis not present

## 2016-07-17 DIAGNOSIS — Z9221 Personal history of antineoplastic chemotherapy: Secondary | ICD-10-CM | POA: Diagnosis not present

## 2016-07-17 DIAGNOSIS — C50412 Malignant neoplasm of upper-outer quadrant of left female breast: Secondary | ICD-10-CM | POA: Diagnosis not present

## 2016-07-17 DIAGNOSIS — K219 Gastro-esophageal reflux disease without esophagitis: Secondary | ICD-10-CM | POA: Diagnosis not present

## 2016-07-17 MED ORDER — RADIAPLEXRX EX GEL
Freq: Once | CUTANEOUS | Status: AC
Start: 2016-07-17 — End: 2016-07-17
  Administered 2016-07-17: 16:00:00 via TOPICAL

## 2016-07-17 NOTE — Addendum Note (Signed)
Encounter addended by: Ernst Spell, RN on: 07/17/2016  4:29 PM<BR>    Actions taken: MAR administration accepted

## 2016-07-17 NOTE — Progress Notes (Signed)
Heather [name] is here for her 23rd fraction of radiation to her Left Breast. She denies pain or fatigue. Her Left Breast is slightly red. She does have hyperpigmentation present to her Left Breast. She reports an area of itching to the upper outer area of her Left Breast. I have suggested she can apply hydrocortisone cream to this area. She is using the Radiplex cream, and I will provide her with a second tube today.   BP 117/75   Pulse 82   Temp 97.7 F (36.5 C)   Ht 4\' 11"  (1.499 m)   Wt 132 lb 8 oz (60.1 kg)   BMI 26.76 kg/m    Wt Readings from Last 3 Encounters:  07/17/16 132 lb 8 oz (60.1 kg)  07/12/16 133 lb 3.2 oz (60.4 kg)  07/05/16 131 lb (59.4 kg)

## 2016-07-17 NOTE — Addendum Note (Signed)
Encounter addended by: Ernst Spell, RN on: 07/17/2016  3:59 PM<BR>    Actions taken: Order Entry activity accessed, Diagnosis association updated

## 2016-07-17 NOTE — Progress Notes (Signed)
    Weekly Management Note:  Outpatient    ICD-9-CM ICD-10-CM   1. Breast cancer of upper-outer quadrant of left female breast (HCC) 174.4 C50.412     Current Dose:  46 Gy  Projected Dose: 60 Gy   Narrative:  The patient presents for routine under treatment assessment.  CBCT/MVCT images/Port film x-rays were reviewed.  The chart was checked. No new issues. She is here for her 23rd fraction of radiation to her Left Breast. She denies pain or fatigue. She does have hyperpigmentation present to her Left Breast. She reports an area of itching to the upper outer area of her Left Breast. She knows she can apply hydrocortisone cream to this area. She is using the Radiplex cream, and we will provide her with a second tube today.   Physical Findings:  height is 4\' 11"  (1.499 m) and weight is 132 lb 8 oz (60.1 kg). Her temperature is 97.7 F (36.5 C). Her blood pressure is 117/75 and her pulse is 82.   Wt Readings from Last 3 Encounters:  07/17/16 132 lb 8 oz (60.1 kg)  07/12/16 133 lb 3.2 oz (60.4 kg)  07/05/16 131 lb (59.4 kg)   Moderate hyperpigmentation throughout the left breast and axilla.  Impression:  The patient is tolerating radiotherapy.  Plan:  Continue radiotherapy as planned.  ________________________________   Eppie Gibson, M.D.  This document serves as a record of services personally performed by Eppie Gibson, MD. It was created on her behalf by Bethann Humble, a trained medical scribe. The creation of this record is based on the scribe's personal observations and the provider's statements to them. This document has been checked and approved by the attending provider.

## 2016-07-18 ENCOUNTER — Ambulatory Visit
Admission: RE | Admit: 2016-07-18 | Discharge: 2016-07-18 | Disposition: A | Discharge status: Home / Self Care | Payer: BLUE CROSS/BLUE SHIELD | Source: Ambulatory Visit | Attending: Radiation Oncology | Admitting: Radiation Oncology

## 2016-07-18 DIAGNOSIS — Z833 Family history of diabetes mellitus: Secondary | ICD-10-CM | POA: Diagnosis not present

## 2016-07-18 DIAGNOSIS — Z79899 Other long term (current) drug therapy: Secondary | ICD-10-CM | POA: Diagnosis not present

## 2016-07-18 DIAGNOSIS — Z510 Encounter for antineoplastic radiation therapy: Secondary | ICD-10-CM | POA: Diagnosis not present

## 2016-07-18 DIAGNOSIS — Z170 Estrogen receptor positive status [ER+]: Secondary | ICD-10-CM | POA: Diagnosis not present

## 2016-07-18 DIAGNOSIS — K219 Gastro-esophageal reflux disease without esophagitis: Secondary | ICD-10-CM | POA: Diagnosis not present

## 2016-07-18 DIAGNOSIS — Z9221 Personal history of antineoplastic chemotherapy: Secondary | ICD-10-CM | POA: Diagnosis not present

## 2016-07-18 DIAGNOSIS — C50412 Malignant neoplasm of upper-outer quadrant of left female breast: Secondary | ICD-10-CM | POA: Diagnosis not present

## 2016-07-18 DIAGNOSIS — Z8249 Family history of ischemic heart disease and other diseases of the circulatory system: Secondary | ICD-10-CM | POA: Diagnosis not present

## 2016-07-19 ENCOUNTER — Ambulatory Visit
Admission: RE | Admit: 2016-07-19 | Discharge: 2016-07-19 | Disposition: A | Discharge status: Home / Self Care | Payer: BLUE CROSS/BLUE SHIELD | Source: Ambulatory Visit | Attending: Radiation Oncology | Admitting: Radiation Oncology

## 2016-07-19 DIAGNOSIS — Z79899 Other long term (current) drug therapy: Secondary | ICD-10-CM | POA: Diagnosis not present

## 2016-07-19 DIAGNOSIS — Z170 Estrogen receptor positive status [ER+]: Secondary | ICD-10-CM | POA: Diagnosis not present

## 2016-07-19 DIAGNOSIS — Z510 Encounter for antineoplastic radiation therapy: Secondary | ICD-10-CM | POA: Diagnosis not present

## 2016-07-19 DIAGNOSIS — Z8249 Family history of ischemic heart disease and other diseases of the circulatory system: Secondary | ICD-10-CM | POA: Diagnosis not present

## 2016-07-19 DIAGNOSIS — C50412 Malignant neoplasm of upper-outer quadrant of left female breast: Secondary | ICD-10-CM | POA: Diagnosis not present

## 2016-07-19 DIAGNOSIS — Z833 Family history of diabetes mellitus: Secondary | ICD-10-CM | POA: Diagnosis not present

## 2016-07-19 DIAGNOSIS — K219 Gastro-esophageal reflux disease without esophagitis: Secondary | ICD-10-CM | POA: Diagnosis not present

## 2016-07-19 DIAGNOSIS — Z9221 Personal history of antineoplastic chemotherapy: Secondary | ICD-10-CM | POA: Diagnosis not present

## 2016-07-20 ENCOUNTER — Ambulatory Visit
Admission: RE | Admit: 2016-07-20 | Discharge: 2016-07-20 | Disposition: A | Discharge status: Home / Self Care | Payer: BLUE CROSS/BLUE SHIELD | Source: Ambulatory Visit | Attending: Radiation Oncology | Admitting: Radiation Oncology

## 2016-07-20 DIAGNOSIS — Z833 Family history of diabetes mellitus: Secondary | ICD-10-CM | POA: Diagnosis not present

## 2016-07-20 DIAGNOSIS — K219 Gastro-esophageal reflux disease without esophagitis: Secondary | ICD-10-CM | POA: Diagnosis not present

## 2016-07-20 DIAGNOSIS — Z9221 Personal history of antineoplastic chemotherapy: Secondary | ICD-10-CM | POA: Diagnosis not present

## 2016-07-20 DIAGNOSIS — Z170 Estrogen receptor positive status [ER+]: Secondary | ICD-10-CM | POA: Diagnosis not present

## 2016-07-20 DIAGNOSIS — Z510 Encounter for antineoplastic radiation therapy: Secondary | ICD-10-CM | POA: Diagnosis not present

## 2016-07-20 DIAGNOSIS — Z79899 Other long term (current) drug therapy: Secondary | ICD-10-CM | POA: Diagnosis not present

## 2016-07-20 DIAGNOSIS — Z8249 Family history of ischemic heart disease and other diseases of the circulatory system: Secondary | ICD-10-CM | POA: Diagnosis not present

## 2016-07-20 DIAGNOSIS — C50412 Malignant neoplasm of upper-outer quadrant of left female breast: Secondary | ICD-10-CM | POA: Diagnosis not present

## 2016-07-21 ENCOUNTER — Ambulatory Visit
Admission: RE | Admit: 2016-07-21 | Discharge: 2016-07-21 | Disposition: A | Discharge status: Home / Self Care | Payer: BLUE CROSS/BLUE SHIELD | Source: Ambulatory Visit | Attending: Radiation Oncology | Admitting: Radiation Oncology

## 2016-07-21 DIAGNOSIS — Z833 Family history of diabetes mellitus: Secondary | ICD-10-CM | POA: Diagnosis not present

## 2016-07-21 DIAGNOSIS — Z8249 Family history of ischemic heart disease and other diseases of the circulatory system: Secondary | ICD-10-CM | POA: Diagnosis not present

## 2016-07-21 DIAGNOSIS — Z170 Estrogen receptor positive status [ER+]: Secondary | ICD-10-CM | POA: Diagnosis not present

## 2016-07-21 DIAGNOSIS — C50412 Malignant neoplasm of upper-outer quadrant of left female breast: Secondary | ICD-10-CM | POA: Diagnosis not present

## 2016-07-21 DIAGNOSIS — Z9221 Personal history of antineoplastic chemotherapy: Secondary | ICD-10-CM | POA: Diagnosis not present

## 2016-07-21 DIAGNOSIS — Z79899 Other long term (current) drug therapy: Secondary | ICD-10-CM | POA: Diagnosis not present

## 2016-07-21 DIAGNOSIS — Z510 Encounter for antineoplastic radiation therapy: Secondary | ICD-10-CM | POA: Diagnosis not present

## 2016-07-21 DIAGNOSIS — K219 Gastro-esophageal reflux disease without esophagitis: Secondary | ICD-10-CM | POA: Diagnosis not present

## 2016-07-22 ENCOUNTER — Ambulatory Visit: Payer: BLUE CROSS/BLUE SHIELD

## 2016-07-23 ENCOUNTER — Ambulatory Visit: Payer: BLUE CROSS/BLUE SHIELD

## 2016-07-24 ENCOUNTER — Ambulatory Visit
Admission: RE | Admit: 2016-07-24 | Discharge: 2016-07-24 | Disposition: A | Discharge status: Home / Self Care | Payer: BLUE CROSS/BLUE SHIELD | Source: Ambulatory Visit | Attending: Radiation Oncology | Admitting: Radiation Oncology

## 2016-07-24 ENCOUNTER — Ambulatory Visit: Payer: BLUE CROSS/BLUE SHIELD

## 2016-07-24 ENCOUNTER — Encounter: Payer: Self-pay | Admitting: Radiation Oncology

## 2016-07-24 VITALS — BP 110/65 | HR 88 | Temp 98.2°F | Ht 59.0 in | Wt 132.9 lb

## 2016-07-24 DIAGNOSIS — Z833 Family history of diabetes mellitus: Secondary | ICD-10-CM | POA: Diagnosis not present

## 2016-07-24 DIAGNOSIS — Z170 Estrogen receptor positive status [ER+]: Secondary | ICD-10-CM | POA: Diagnosis not present

## 2016-07-24 DIAGNOSIS — Z510 Encounter for antineoplastic radiation therapy: Secondary | ICD-10-CM | POA: Diagnosis not present

## 2016-07-24 DIAGNOSIS — C50412 Malignant neoplasm of upper-outer quadrant of left female breast: Secondary | ICD-10-CM | POA: Diagnosis not present

## 2016-07-24 DIAGNOSIS — Z79899 Other long term (current) drug therapy: Secondary | ICD-10-CM | POA: Diagnosis not present

## 2016-07-24 DIAGNOSIS — K219 Gastro-esophageal reflux disease without esophagitis: Secondary | ICD-10-CM | POA: Diagnosis not present

## 2016-07-24 DIAGNOSIS — Z8249 Family history of ischemic heart disease and other diseases of the circulatory system: Secondary | ICD-10-CM | POA: Diagnosis not present

## 2016-07-24 DIAGNOSIS — Z9221 Personal history of antineoplastic chemotherapy: Secondary | ICD-10-CM | POA: Diagnosis not present

## 2016-07-24 NOTE — Progress Notes (Signed)
    Weekly Management Note:  Outpatient    ICD-9-CM ICD-10-CM   1. Breast cancer of upper-outer quadrant of left female breast (HCC) 174.4 C50.412     Current Dose: 56 Gy  Projected Dose: 60 Gy   Narrative:  The patient presents for routine under treatment assessment.  CBCT/MVCT images/Port film x-rays were reviewed.  The chart was checked. No new concerns  Physical Findings:  height is 4\' 11"  (1.499 m) and weight is 132 lb 14.4 oz (60.3 kg). Her temperature is 98.2 F (36.8 C). Her blood pressure is 110/65 and her pulse is 88.   Wt Readings from Last 3 Encounters:  07/24/16 132 lb 14.4 oz (60.3 kg)  07/17/16 132 lb 8 oz (60.1 kg)  07/12/16 133 lb 3.2 oz (60.4 kg)   Dryness, hyperpigmentation throughout the left breast and axilla.  Impression:  The patient is tolerating radiotherapy.  Plan:  Continue radiotherapy as planned. F/u in 61mo. Skin care discussed  ________________________________   Eppie Gibson, M.D.  This document serves as a record of services personally performed by Eppie Gibson, MD. It was created on her behalf by Bethann Humble, a trained medical scribe. The creation of this record is based on the scribe's personal observations and the provider's statements to them. This document has been checked and approved by the attending provider.

## 2016-07-24 NOTE — Progress Notes (Signed)
Heather [name] is here for her 28th fraction of radiation to her Left Breast. She denies pain or fatigue. She does have hyperpigmentation to her Left Breast and Left Axilla area. She does report itching to this area also, and she is using hydrocortisone cream to this area for relief. She is also using radiaplex to her Left Breast, and will use Vitamin E cream when her tube is completed. She was given a one month follow up appointment.   BP 110/65   Pulse 88   Temp 98.2 F (36.8 C)   Ht 4\' 11"  (1.499 m)   Wt 132 lb 14.4 oz (60.3 kg)   BMI 26.84 kg/m    Wt Readings from Last 3 Encounters:  07/24/16 132 lb 14.4 oz (60.3 kg)  07/17/16 132 lb 8 oz (60.1 kg)  07/12/16 133 lb 3.2 oz (60.4 kg)

## 2016-07-25 ENCOUNTER — Ambulatory Visit: Payer: BLUE CROSS/BLUE SHIELD

## 2016-07-25 ENCOUNTER — Ambulatory Visit
Admission: RE | Admit: 2016-07-25 | Discharge: 2016-07-25 | Disposition: A | Discharge status: Home / Self Care | Payer: BLUE CROSS/BLUE SHIELD | Source: Ambulatory Visit | Attending: Radiation Oncology | Admitting: Radiation Oncology

## 2016-07-25 DIAGNOSIS — K219 Gastro-esophageal reflux disease without esophagitis: Secondary | ICD-10-CM | POA: Diagnosis not present

## 2016-07-25 DIAGNOSIS — Z833 Family history of diabetes mellitus: Secondary | ICD-10-CM | POA: Diagnosis not present

## 2016-07-25 DIAGNOSIS — Z510 Encounter for antineoplastic radiation therapy: Secondary | ICD-10-CM | POA: Diagnosis not present

## 2016-07-25 DIAGNOSIS — Z9221 Personal history of antineoplastic chemotherapy: Secondary | ICD-10-CM | POA: Diagnosis not present

## 2016-07-25 DIAGNOSIS — Z79899 Other long term (current) drug therapy: Secondary | ICD-10-CM | POA: Diagnosis not present

## 2016-07-25 DIAGNOSIS — Z8249 Family history of ischemic heart disease and other diseases of the circulatory system: Secondary | ICD-10-CM | POA: Diagnosis not present

## 2016-07-25 DIAGNOSIS — Z170 Estrogen receptor positive status [ER+]: Secondary | ICD-10-CM | POA: Diagnosis not present

## 2016-07-25 DIAGNOSIS — C50412 Malignant neoplasm of upper-outer quadrant of left female breast: Secondary | ICD-10-CM | POA: Diagnosis not present

## 2016-07-26 ENCOUNTER — Encounter: Payer: Self-pay | Admitting: Radiation Oncology

## 2016-07-26 ENCOUNTER — Ambulatory Visit: Payer: BLUE CROSS/BLUE SHIELD

## 2016-07-26 DIAGNOSIS — Z9221 Personal history of antineoplastic chemotherapy: Secondary | ICD-10-CM | POA: Diagnosis not present

## 2016-07-26 DIAGNOSIS — K219 Gastro-esophageal reflux disease without esophagitis: Secondary | ICD-10-CM | POA: Diagnosis not present

## 2016-07-26 DIAGNOSIS — Z8249 Family history of ischemic heart disease and other diseases of the circulatory system: Secondary | ICD-10-CM | POA: Diagnosis not present

## 2016-07-26 DIAGNOSIS — Z170 Estrogen receptor positive status [ER+]: Secondary | ICD-10-CM | POA: Diagnosis not present

## 2016-07-26 DIAGNOSIS — Z510 Encounter for antineoplastic radiation therapy: Secondary | ICD-10-CM | POA: Diagnosis not present

## 2016-07-26 DIAGNOSIS — Z833 Family history of diabetes mellitus: Secondary | ICD-10-CM | POA: Diagnosis not present

## 2016-07-26 DIAGNOSIS — C50412 Malignant neoplasm of upper-outer quadrant of left female breast: Secondary | ICD-10-CM | POA: Diagnosis not present

## 2016-07-26 DIAGNOSIS — Z79899 Other long term (current) drug therapy: Secondary | ICD-10-CM | POA: Diagnosis not present

## 2016-07-31 ENCOUNTER — Other Ambulatory Visit: Payer: Self-pay | Admitting: *Deleted

## 2016-07-31 DIAGNOSIS — C50412 Malignant neoplasm of upper-outer quadrant of left female breast: Secondary | ICD-10-CM

## 2016-07-31 DIAGNOSIS — Z170 Estrogen receptor positive status [ER+]: Secondary | ICD-10-CM

## 2016-08-03 NOTE — Progress Notes (Signed)
  Radiation Oncology         [phone] ________________________________  Name: Heather [name] MRN: [id number]  Date: 07/26/2016  DOB: [birth year]  End of Treatment Note  DIAGNOSIS:     ICD-9-CM ICD-10-CM   1. Breast cancer of upper-outer quadrant of left female breast ([place]) 174.4 C50.412    Indication for treatment:  curative      Radiation treatment dates:   06/14/2016-07/26/2016  Site/dose:   1) left breast / 50 Gy in 25 fractions, 2) left breast boost / 10 Gy in 5 fractions  Beams/energy:  1) 3D conformal / 10 and 6 MV photons, 2) en face / 9 MeV electrons  Narrative: The patient tolerated radiation treatment relatively well.      Plan: The patient has completed radiation treatment. The patient will return to radiation oncology clinic for routine followup in one month. I advised them to call or return sooner if they have any questions or concerns related to their recovery or treatment.  -----------------------------------  Eppie Gibson, MD

## 2016-08-09 ENCOUNTER — Encounter: Payer: Self-pay | Admitting: Hematology and Oncology

## 2016-08-09 ENCOUNTER — Other Ambulatory Visit (HOSPITAL_BASED_OUTPATIENT_CLINIC_OR_DEPARTMENT_OTHER): Payer: BLUE CROSS/BLUE SHIELD

## 2016-08-09 ENCOUNTER — Ambulatory Visit (HOSPITAL_COMMUNITY)
Admission: RE | Admit: 2016-08-09 | Discharge: 2016-08-09 | Disposition: A | Discharge status: Home / Self Care | Payer: BLUE CROSS/BLUE SHIELD | Source: Ambulatory Visit | Attending: Hematology and Oncology | Admitting: Hematology and Oncology

## 2016-08-09 ENCOUNTER — Ambulatory Visit (HOSPITAL_BASED_OUTPATIENT_CLINIC_OR_DEPARTMENT_OTHER): Payer: BLUE CROSS/BLUE SHIELD | Admitting: Hematology and Oncology

## 2016-08-09 DIAGNOSIS — C50412 Malignant neoplasm of upper-outer quadrant of left female breast: Secondary | ICD-10-CM

## 2016-08-09 DIAGNOSIS — Z170 Estrogen receptor positive status [ER+]: Secondary | ICD-10-CM | POA: Diagnosis not present

## 2016-08-09 DIAGNOSIS — G620 Drug-induced polyneuropathy: Secondary | ICD-10-CM

## 2016-08-09 LAB — CBC WITH DIFFERENTIAL/PLATELET
BASO%: 0.5 % (ref 0.0–2.0)
Basophils Absolute: 0 10*3/uL (ref 0.0–0.1)
EOS%: 1.1 % (ref 0.0–7.0)
Eosinophils Absolute: 0 10*3/uL (ref 0.0–0.5)
HCT: 40.9 % (ref 34.8–46.6)
HGB: 13.9 g/dL (ref 11.6–15.9)
LYMPH%: 28.5 % (ref 14.0–49.7)
MCH: 30.2 pg (ref 25.1–34.0)
MCHC: 34 g/dL (ref 31.5–36.0)
MCV: 88.7 fL (ref 79.5–101.0)
MONO#: 0.3 10*3/uL (ref 0.1–0.9)
MONO%: 6.9 % (ref 0.0–14.0)
NEUT#: 2.4 10*3/uL (ref 1.5–6.5)
NEUT%: 63 % (ref 38.4–76.8)
Platelets: 221 10*3/uL (ref 145–400)
RBC: 4.61 10*6/uL (ref 3.70–5.45)
RDW: 14.2 % (ref 11.2–14.5)
WBC: 3.8 10*3/uL — ABNORMAL LOW (ref 3.9–10.3)
lymph#: 1.1 10*3/uL (ref 0.9–3.3)

## 2016-08-09 LAB — COMPREHENSIVE METABOLIC PANEL
ALT: 34 U/L (ref 0–55)
AST: 31 U/L (ref 5–34)
Albumin: 3.6 g/dL (ref 3.5–5.0)
Alkaline Phosphatase: 95 U/L (ref 40–150)
Anion Gap: 9 mEq/L (ref 3–11)
BUN: 9.6 mg/dL (ref 7.0–26.0)
CO2: 27 mEq/L (ref 22–29)
Calcium: 9.5 mg/dL (ref 8.4–10.4)
Chloride: 106 mEq/L (ref 98–109)
Creatinine: 0.7 mg/dL (ref 0.6–1.1)
EGFR: >90 mL/min/{1.73_m2} (ref >90)
Glucose: 82 mg/dl (ref 70–140)
Potassium: 3.9 mEq/L (ref 3.5–5.1)
Sodium: 142 mEq/L (ref 136–145)
Total Bilirubin: 0.48 mg/dL (ref 0.20–1.20)
Total Protein: 7.2 g/dL (ref 6.4–8.3)

## 2016-08-09 LAB — ABO/RH: ABO/RH(D): A POS

## 2016-08-09 MED ORDER — ANASTROZOLE 1 MG PO TABS: 1.0000 mg | ORAL_TABLET | Freq: Every day | ORAL | 3 refills | Status: DC

## 2016-08-09 NOTE — Assessment & Plan Note (Signed)
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 Systemic chemotherapy with dose dense Adriamycin and Cytoxan 4 followed by Abraxane weekly 12 started 12/29/2015 and completed 05/10/2016 2. Adjuvant radiation therapy started 06/14/2016 and completed 07/25/2016 3. Adjuvant antiestrogen therapy With anastrozole 1 mg by mouth daily started 08/09/2016  Anastrozole counseling:We discussed the risks and benefits of anti-estrogen therapy with aromatase inhibitors. These include but not limited to insomnia, hot flashes, mood changes, vaginal dryness, bone density loss, and weight gain. We strongly believe that the benefits far outweigh the risks. Patient understands these risks and consented to starting treatment. Planned treatment duration is 5 years.  Chemotherapy-induced neuropathy: Improving slowly over time  Return to clinic in 3 months for toxicity check.

## 2016-08-09 NOTE — Progress Notes (Signed)
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%, Ki-67 90%, HER-2 negative ratio 1.44      11/22/2015 Surgery    Lt Lumpectomy: IDC 1.5 cm, 0/2 LN, grade 2, with Int Grade DCIS, ER 100%, PR 30%, Her 2 Neg Ratio 1.91, T1cN0 (stage 1A)Oncotype DX score 37, 10 year ROR 25%      12/29/2015 - 05/10/2016 Chemotherapy    Adjuvant chemotherapy with dose dense Adriamycin Cytoxan 4 followed by Abraxane weekly 10 stopped early due to neuropathy      06/14/2016 - 07/25/2016 Radiation Therapy    Adjuvant radiation therapy      08/09/2016 -  Anti-estrogen oral therapy    Anastrozole 1 mg by mouth daily       CHIEF COMPLIANT: follow-up after radiation therapy  INTERVAL HISTORY: Heather [name] is a 60 year old with above-mentioned history of left breast cancer currently completed adjuvant radiation is here for follow-up. She is feeling a lot better since radiation is been completed. Her hair is coming back. She does have mild neuropathy which is improving slowly over time. She is back to her routine activities.  REVIEW OF SYSTEMS:   Constitutional: Denies fevers, chills or abnormal weight loss Eyes: Denies blurriness of vision Ears, nose, mouth, throat, and face: Denies mucositis or sore throat Respiratory: Denies cough, dyspnea or wheezes Cardiovascular: Denies palpitation, chest discomfort Gastrointestinal:  Denies nausea, heartburn or change in bowel habits Skin: Denies abnormal skin rashes Lymphatics: Denies new lymphadenopathy or easy bruising Neurological:Denies numbness, tingling or new weaknesses Behavioral/Psych: Mood is  stable, no new changes  Extremities: No lower extremity edema Breast:  denies any pain or lumps or nodules in either breasts All other systems were reviewed with the patient and are negative.  I have reviewed the past medical history, past surgical history, social history and family history with the patient and they are unchanged from previous note.  ALLERGIES:  has No Known Allergies.  MEDICATIONS:  Current Outpatient Prescriptions  Medication Sig Dispense Refill  . anastrozole (ARIMIDEX) 1 MG tablet Take 1 tablet (1 mg total) by mouth daily. 90 tablet 3  . Ascorbic Acid (VITAMIN C) 1000 MG tablet Take 1,000 mg by mouth daily.    . calcium carbonate (OS-CAL) 600 MG TABS Take 600 mg by mouth 2 (two) times daily with a meal.    . cholecalciferol (VITAMIN D) 1000 UNITS tablet Take 1,000 Units by mouth daily.    Marland Kitchen tobramycin (TOBREX) 0.3 % ophthalmic solution Place 1 drop into both eyes every 4 (four) hours.    Marland Kitchen UNABLE TO FIND Med Name: Immunocal platinum (for her immune system)    . VITAMIN E COMPLEX PO Take by mouth.     No current facility-administered medications for this visit.     PHYSICAL EXAMINATION: ECOG PERFORMANCE STATUS: 1 - Symptomatic but completely ambulatory  Vitals:   08/09/16 1149  BP: 121/76  Pulse: 75  Resp: 18  Temp: 97.8 F (36.6 C)   Filed Weights   08/09/16 1149  Weight: 132 lb 8 oz (60.1 kg)    GENERAL:alert,  no distress and comfortable SKIN: skin color, texture, turgor are normal, no rashes or significant lesions EYES: normal, Conjunctiva are pink and non-injected, sclera clear OROPHARYNX:no exudate, no erythema and lips, buccal mucosa, and tongue normal  NECK: supple, thyroid normal size, non-tender, without nodularity LYMPH:  no palpable lymphadenopathy in the cervical, axillary or inguinal LUNGS: clear to auscultation and percussion with normal breathing effort HEART: regular rate & rhythm and no murmurs and no lower extremity  edema ABDOMEN:abdomen soft, non-tender and normal bowel sounds MUSCULOSKELETAL:no cyanosis of digits and no clubbing  NEURO: alert & oriented x 3 with fluent speech, no focal motor/sensory deficits EXTREMITIES: No lower extremity edema BREAST: No palpable masses or nodules in either right or left breasts. No palpable axillary supraclavicular or infraclavicular adenopathy no breast tenderness or nipple discharge. (exam performed in the presence of a chaperone)  LABORATORY DATA:  I have reviewed the data as listed   Chemistry      Component Value Date/Time   NA 142 08/09/2016 1122   K 3.9 08/09/2016 1122   CO2 27 08/09/2016 1122   BUN 9.6 08/09/2016 1122   CREATININE 0.7 08/09/2016 1122      Component Value Date/Time   CALCIUM 9.5 08/09/2016 1122   ALKPHOS 95 08/09/2016 1122   AST 31 08/09/2016 1122   ALT 34 08/09/2016 1122   BILITOT 0.48 08/09/2016 1122       Lab Results  Component Value Date   WBC 3.8 (L) 08/09/2016   HGB 13.9 08/09/2016   HCT 40.9 08/09/2016   MCV 88.7 08/09/2016   PLT 221 08/09/2016   NEUTROABS 2.4 08/09/2016     ASSESSMENT & PLAN:  Breast cancer of upper-outer quadrant of left female breast ([place]) Lt Lumpectomy 11/22/15: 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 Systemic chemotherapy with dose dense Adriamycin and Cytoxan 4 followed by Abraxane weekly 12  started 12/29/2015 and completed 05/10/2016 2. Adjuvant radiation therapy started 06/14/2016 and completed 07/25/2016 3. Adjuvant antiestrogen therapy With anastrozole 1 mg by mouth daily started 08/09/2016  Anastrozole counseling:We discussed the risks and benefits of anti-estrogen therapy with aromatase inhibitors. These include but not limited to insomnia, hot flashes, mood changes, vaginal dryness, bone density loss, and weight gain. We  strongly believe that the benefits far outweigh the risks. Patient understands these risks and consented to starting treatment. Planned treatment duration is 5 years.  Chemotherapy-induced neuropathy: Improving slowly over time  Return to clinic in 3 months for toxicity check.      No orders of the defined types were placed in this encounter.  The patient has a good understanding of the overall plan. she agrees with it. she will call with any problems that may develop before the next visit here.   Rulon Eisenmenger, MD 08/09/16

## 2016-08-24 ENCOUNTER — Encounter: Payer: Self-pay | Admitting: Radiation Oncology

## 2016-09-01 ENCOUNTER — Ambulatory Visit
Admission: RE | Admit: 2016-09-01 | Payer: BLUE CROSS/BLUE SHIELD | Source: Ambulatory Visit | Admitting: Radiation Oncology

## 2016-09-01 ENCOUNTER — Encounter (HOSPITAL_COMMUNITY): Payer: Self-pay | Admitting: Vascular Surgery

## 2016-09-01 HISTORY — DX: Personal history of irradiation: Z92.3

## 2016-09-04 ENCOUNTER — Telehealth: Payer: Self-pay | Admitting: *Deleted

## 2016-09-04 NOTE — Telephone Encounter (Signed)
CALLED PATIENT TO RESCHEDULED MISSED FU, NO ANSWER, NO VOICE MAIL ATTACHED, WILL CALL LATER

## 2016-09-06 NOTE — Progress Notes (Signed)
Electron Holiday representative Note  Diagnosis: Breast Cancer Breast cancer of upper-outer quadrant of left female breast ([place]) 174.4 C50.412   outpatient   The patient's CT images from her CT simulation were reviewed to plan her boost treatment to her left breast  lumpectomy cavity.  Measurements were made regarding the size and depth of the surgical bed. The boost to the lumpectomy cavity will be delivered with 9 MeV electrons; 10 Gy in 5 fractions has been prescribed to the 100% isodose line.   An electron Best boy was reviewed and approved.  A custom electron cut-out will be used for her boost field.    -----------------------------------  Eppie Gibson, MD

## 2016-10-11 ENCOUNTER — Encounter: Payer: BLUE CROSS/BLUE SHIELD | Admitting: Adult Health

## 2016-10-25 ENCOUNTER — Ambulatory Visit (INDEPENDENT_AMBULATORY_CARE_PROVIDER_SITE_OTHER): Payer: BLUE CROSS/BLUE SHIELD | Admitting: Gynecology

## 2016-10-25 ENCOUNTER — Encounter: Payer: Self-pay | Admitting: Gynecology

## 2016-10-25 VITALS — BP 122/80 | Ht 59.0 in | Wt 134.0 lb

## 2016-10-25 DIAGNOSIS — M8580 Other specified disorders of bone density and structure, unspecified site: Secondary | ICD-10-CM | POA: Diagnosis not present

## 2016-10-25 DIAGNOSIS — Z8639 Personal history of other endocrine, nutritional and metabolic disease: Secondary | ICD-10-CM

## 2016-10-25 DIAGNOSIS — Z853 Personal history of malignant neoplasm of breast: Secondary | ICD-10-CM | POA: Diagnosis not present

## 2016-10-25 DIAGNOSIS — Z01411 Encounter for gynecological examination (general) (routine) with abnormal findings: Secondary | ICD-10-CM

## 2016-10-25 DIAGNOSIS — Z86010 Personal history of colonic polyps: Secondary | ICD-10-CM | POA: Insufficient documentation

## 2016-10-25 NOTE — Progress Notes (Signed)
Heather [name] 1956/08/24 [id number]   History:    60 y.o.  for annual gyn exam is asymptomatic today but in January this year was diagnosed with invasive ductal carcinoma and subsequently underwent lumpectomy. Report from medical oncologist as follows:  "Lt Lumpectomy 11/22/15: 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 Systemic chemotherapy with dose dense Adriamycin and Cytoxan 4 followed by Abraxane weekly 12 started 12/29/2015 and completed 05/10/2016 2. Adjuvant radiation therapy started 06/14/2016 and completed 07/25/2016 3. Adjuvant antiestrogen therapy With anastrozole 1 mg by mouth daily started 08/09/2016"  Patient had a colonoscopy in 2014 benign colon polyps were removed. Patient many years ago had a total abdominal hysterectomy as a result of symptomatic leiomyomatous uteri. She also has had history vitamin D deficiency in the past and is currently taking calcium and vitamin D. Prior to her hysterectomy she reports no previous history of any abnormal Pap smear. Her PCP has been Dr. Karle Starch. Patient has never been on any hormone replacement therapy and currently suffering from no vasomotor symptoms.  Bone density study in 2016 lowest T score AP spine -1.7 the osteopenic range  Past medical history,surgical history, family history and social history were all reviewed and documented in the EPIC chart.  Gynecologic History No LMP recorded. Patient has had a hysterectomy. Contraception: status post hysterectomy Last Pap: Several years ago. Results were: normal Last mammogram: See above. Results were: See above  Obstetric History OB History  Gravida Para Term Preterm AB Living  '2 2 2     2  ' SAB TAB Ectopic Multiple Live Births          2    # Outcome Date GA Lbr Len/2nd Weight Sex Delivery Anes PTL Lv  2 Term     F  CS-Unspec  N LIV  1 Term     F Vag-Spont  N LIV       ROS: A ROS was performed and pertinent positives and negatives are included in the history.  GENERAL: No fevers or chills. HEENT: No change in vision, no earache, sore throat or sinus congestion. NECK: No pain or stiffness. CARDIOVASCULAR: No chest pain or pressure. No palpitations. PULMONARY: No shortness of breath, cough or wheeze. GASTROINTESTINAL: No abdominal pain, nausea, vomiting or diarrhea, melena or bright red blood per rectum. GENITOURINARY: No urinary frequency, urgency, hesitancy or dysuria. MUSCULOSKELETAL: No joint or muscle pain, no back pain, no recent trauma. DERMATOLOGIC: No rash, no itching, no lesions. ENDOCRINE: No polyuria, polydipsia, no heat or cold intolerance. No recent change in weight. HEMATOLOGICAL: No anemia or easy bruising or bleeding. NEUROLOGIC: No headache, seizures, numbness, tingling or weakness. PSYCHIATRIC: No depression, no loss of interest in normal activity or change in sleep pattern.     Exam: chaperone present  BP 122/80   Ht '4\' 11"'  (1.499 m)   Wt 134 lb (60.8 kg)   BMI 27.06 kg/m   Body mass index is 27.06 kg/m.  General appearance : Well developed well nourished female. No acute distress HEENT: Eyes: no retinal hemorrhage or exudates,  Neck supple, trachea midline, no carotid bruits, no thyroidmegaly Lungs: Clear to auscultation, no rhonchi or wheezes, or rib retractions  Heart: Regular rate and rhythm, no murmurs or gallops Breast:Examined in sitting and supine position were symmetrical in appearance, no palpable  masses or tenderness,  no skin retraction, no nipple inversion, no nipple discharge, no skin discoloration, no axillary or supraclavicular lymphadenopathy Abdomen: no palpable masses or tenderness, no rebound or guarding Extremities: no edema or skin discoloration or tenderness  Pelvic:  Bartholin, Urethra, Skene Glands: Within normal limits             Vagina: No gross  lesions or discharge  Cervix: Absent  Uterus  absent  Adnexa  Without masses or tenderness  Anus and perineum  normal   Rectovaginal  normal sphincter tone without palpated masses or tenderness             Hemoccult cards will be provided     Assessment/Plan:  60 y.o. female for annual exam with history of left lumpectomy 7 result of invasive ductal carcinoma change in her this year currently on Arimidex 1 mg daily and is being followed by medical oncologist. Review of her record indicated she still has not had a fasting lipid profile, TSH and vitamin D level which she will obtain next week. We'll also obtain an ultrasound to better assess her ovaries since it was somewhat difficult today and with her history of breast cancer. I've given her information on BRCA one BRCA2 testing to discuss with her medical oncologist because she was voicing concerns about possibly getting her ovaries taken out if indicated. We discussed importance of calcium vitamin D and weightbearing exercises for osteoporosis prevention. Patient no longer needs Pap smears. Patient will bring the fecal Hemoccult cards which she come to the office for her blood tests next week.   Terrance Mass MD, 2:35 PM 10/25/2016

## 2016-10-25 NOTE — Patient Instructions (Addendum)
Densitometra sea (Bone Densitometry) La densitometra sea es un estudio de diagnstico por imgenes en el que se utiliza una radiografa especial que mide la cantidad de calcio y otros minerales en los huesos (densidad sea). Este estudio tambin se conoce como examen de densidad mineral sea o radioabsorciometra de doble energa (DEXA). Puede medir la densidad sea en la cadera y la columna. Es similar a Education administrator. Tambin pueden hacerle este estudio para:  Diagnosticar una enfermedad que causa huesos dbiles o delgados (osteoporosis).  Predecir el riesgo de un hueso roto (fractura).  Determinar si el tratamiento para la osteoporosis funciona. INFORME A SU MDICO:  Cualquier alergia que tenga.  Todos los [employer], incluidos vitaminas, hierbas, gotas oftlmicas, cremas y medicamentos de venta libre.  Problemas previos que usted o los [employer] de su familia hayan tenido con el uso de anestsicos.  Enfermedades de la sangre que tenga.  Si tiene cirugas previas.  Enfermedades que tenga.  Probabilidad de embarazo.  Cualquier otro estudio mdico al que se haya sometido en los ltimos 14 das en el que se haya utilizado material de [place]. RIESGOS Y COMPLICACIONES En general, se trata de un procedimiento seguro. Sin embargo, pueden ocurrir [employer], entre los que se pueden incluir los siguientes:  [place] estudio lo expone a una cantidad muy pequea de radiacin.  Los riesgos de la exposicin a la radiacin pueden ser [employer] para los nios por Associate Professor. ANTES DEL PROCEDIMIENTO  No tome ningn suplemento de calcio durante 24 horas antes de [employer]. Puede comer y beber como lo hace habitualmente.  Qutese todas las joyas de metal, anteojos, aparatos dentales y cualquier otro objeto metlico. PROCEDIMIENTO  Deber recostarse en una camilla. Un generador de rayos X estar ubicado debajo de usted y un dispositivo de imgenes, por  encima.  Se pueden usar otros dispositivos, como cajas o abrazaderas, para posicionar el cuerpo apropiadamente para la exploracin.  Deber permanecer inmvil mientras la mquina explore lentamente su cuerpo.  Las imgenes se Web designer monitor de una computadora. DESPUS DEL PROCEDIMIENTO Es posible que necesite estudios adicionales ms adelante. Esta informacin no tiene Marine scientist el consejo del mdico. Asegrese de hacerle al mdico cualquier pregunta que tenga. Document Released: 07/10/2012 Document Revised: 11/06/2014 Document Reviewed: 03/26/2014 Elsevier Interactive Patient Education  2017 [place] y BRCA2 (BRCA-1 and BRCA-2 Testing) Los genes BRCA1 y BRCA2 producen protenas que ayudan a Psychiatric nurse las clulas daadas. Los estudios de BRCA1 y BRCA2 se hacen para comprobar si hay una mutacin en cualquiera de estos genes. Si hay una mutacin, es posible que los genes no puedan reparar las clulas daadas. Como resultado, las clulas pueden presentar defectos que den Environmental consultant a determinados tipos de Hotel manager. Pueden hacerle este estudio si tiene antecedentes familiares de determinados tipos de cncer, incluido cncer en:  Las mamas.  Las trompas de [employer].  Los ovarios.  El peritoneo. El estudio requiere [place] de sangre o una muestra de clulas de la parte interna de las [place]. Si se requiere [employer] de [place], se extraer de una vena del brazo con Humana Inc. Si se requiere Truddie Coco de clulas, recibir instrucciones para usar un enjuague a fin de tomar [employer]. RESULTADOS Es su responsabilidad retirar el resultado del [place]. Consulte en el laboratorio o en el departamento que hace el estudio cundo y de qu forma obtendr los [place]. Comunquese con el mdico si tiene [employer].  Los [employer] del estudio pueden mostrar si:  No tiene una mutacin en los genes BRCA1 o BRCA2 que aumente el  riesgo de padecer determinados tipos de Hotel manager.  Tiene una mutacin en los genes BRCA1 o BRCA2 que aumenta el riesgo de padecer determinados tipos de Hotel manager.  Tiene una mutacin en los genes BRCA1 o BRCA2, pero no se ha hallado que aumente el riesgo de padecer determinados tipos de Hotel manager. Significado de los Microsoft del anlisis  El resultado negativo del anlisis significa que usted no tiene una mutacin en los genes BRCA1 o BRCA2 que aumente el riesgo de padecer determinados tipos de Hotel manager. Esto no significa que usted nunca tendr cncer. Hable con el mdico o el asesor gentico sobre lo que este resultado significa para usted. Significado de Gap Inc positivos del anlisis  El resultado positivo del anlisis significa que usted tiene una mutacin en los genes BRCA1 o BRCA2 que aumenta el riesgo de padecer determinados tipos de Hotel manager. Las mujeres cuyo resultado del anlisis es positivo corren ms riesgo de sufrir cncer de ovario. Tanto las [employer] Corporation hombres con una mutacin corren ms riesgo de Actor de mama y pueden correr ms riesgo de sufrir otros tipos de Hotel manager. El resultado positivo del anlisis no significa que usted Scientist, product/process development.  Quizs usted sea [employer]. Esto significa que puede transmitirle la mutacin a sus hijos.  Hable con el mdico o el asesor gentico sobre lo que este resultado significa para usted. Significado de los [employer] Southern del [employer], no concluyentes o inciertos del anlisis significan que hay un cambio en los genes BRCA1 o BRCA2, pero este cambio no se ha relacionado con Science writer. Hable con el mdico o el asesor gentico sobre lo que este resultado significa para usted. Esta informacin no tiene Marine scientist el consejo del mdico. Asegrese de hacerle al mdico cualquier pregunta que tenga. Document Released: 08/06/2013 Document Revised: 11/06/2014 Elsevier Interactive Patient Education   2017 [employer].

## 2016-11-01 ENCOUNTER — Ambulatory Visit (INDEPENDENT_AMBULATORY_CARE_PROVIDER_SITE_OTHER): Payer: BLUE CROSS/BLUE SHIELD

## 2016-11-01 ENCOUNTER — Ambulatory Visit (INDEPENDENT_AMBULATORY_CARE_PROVIDER_SITE_OTHER): Payer: BLUE CROSS/BLUE SHIELD | Admitting: Gynecology

## 2016-11-01 ENCOUNTER — Other Ambulatory Visit: Payer: Self-pay | Admitting: Gynecology

## 2016-11-01 ENCOUNTER — Encounter: Payer: Self-pay | Admitting: Gynecology

## 2016-11-01 VITALS — BP 128/86

## 2016-11-01 DIAGNOSIS — N838 Other noninflammatory disorders of ovary, fallopian tube and broad ligament: Secondary | ICD-10-CM

## 2016-11-01 DIAGNOSIS — N839 Noninflammatory disorder of ovary, fallopian tube and broad ligament, unspecified: Secondary | ICD-10-CM | POA: Diagnosis not present

## 2016-11-01 DIAGNOSIS — Z853 Personal history of malignant neoplasm of breast: Secondary | ICD-10-CM

## 2016-11-01 DIAGNOSIS — M8580 Other specified disorders of bone density and structure, unspecified site: Secondary | ICD-10-CM | POA: Diagnosis not present

## 2016-11-01 DIAGNOSIS — Z01411 Encounter for gynecological examination (general) (routine) with abnormal findings: Secondary | ICD-10-CM | POA: Diagnosis not present

## 2016-11-01 DIAGNOSIS — C50412 Malignant neoplasm of upper-outer quadrant of left female breast: Secondary | ICD-10-CM

## 2016-11-01 DIAGNOSIS — Z8639 Personal history of other endocrine, nutritional and metabolic disease: Secondary | ICD-10-CM | POA: Diagnosis not present

## 2016-11-01 LAB — TSH: TSH: 1.9 mIU/L

## 2016-11-01 NOTE — Patient Instructions (Signed)
Marcador tumoral CA-125 (CA-125 Tumor Marker Test) POR QU ME DEBO REALIZAR ESTA PRUEBA? Esta prueba tiene Radiographer, therapeutic nivel del antgeno del cncer125 (CA-125) en la sangre. La prueba de marcador tumoral CA-125 puede ser til para Public affairs consultant de ovario y se realiza solamente si se considera que la mujer corre un alto riesgo de presentar este tipo de Hotel manager. El mdico puede recomendarle esta prueba si:  Usted tiene importantes antecedentes familiares de cncer de ovario.  Usted tiene un defecto gentico en el antgeno del cncer de mama (BRCA, por sus siglas en ingls). Si ya le han diagnosticado cncer de ovario, el mdico puede hacerle esta prueba como ayuda para identificar el grado de la enfermedad y Chief Technology Officer cmo responde al tratamiento. QU TIPO DE MUESTRA SE TOMA? Para esta prueba, se extrae Truddie Coco de [place]. Por lo general, para extraerla, se introduce una aguja en una vena. [place]? No se requiere una preparacin para esta prueba. [place]? Los valores de referencia son los valores saludables establecidos despus de realizarle el anlisis a un grupo grande de personas sanas. Pueden variar Charter Communications, laboratorios y hospitales. Es su responsabilidad retirar el resultado del [place]. Consulte en el laboratorio o en el departamento en el que fue realizado el estudio cundo y cmo podr The [employer] Companies. El valor de referencia para esta prueba es de 0a 35unidades/ml o menos de 35unidades/l (unidades SI). [place]? Los niveles ms altos de CA-125 pueden indicar lo siguiente:  Ciertos tipos de cncer, entre ellos:  Cncer de ovario.  Cncer de pncreas.  Cncer de colon.  Cncer de pulmn.  Cncer de mama.  Linfomas.  Trastornos no cancerosos (benignos), entre ellos:  Cirrosis.  Embarazo.  Endometriosis.  Pancreatitis.  Enfermedad plvica  inflamatoria (EPI). Hable con el mdico [employer], las opciones de tratamiento y, si es necesario, la necesidad de Optometrist ms [place]. Hable con el mdico si tiene [employer]. Esta informacin no tiene Marine scientist el consejo del mdico. Asegrese de hacerle al mdico cualquier pregunta que tenga. Document Released: 08/06/2013 Document Revised: 11/06/2014 Document Reviewed: 03/05/2014 Elsevier Interactive Patient Education  2017 [employer].

## 2016-11-01 NOTE — Progress Notes (Signed)
   Patient is a 61 year old who was seen in the office on December 27 for her annual exam is here today for ultrasound as a result of close surveillance because her history of recently diagnosed 2017 with invasive ductal carcinoma of her left breast where she underwent a lumpectomy in January 23 of 2017. See previous note for detail.  Her treatment summary by medical oncologist has been as follows: 1. Systemic chemotherapy with dose dense Adriamycin and Cytoxan 4 followed by Abraxane weekly 12 started 12/29/2015 and completed 05/10/2016 2. Adjuvant radiation therapy started 06/14/2016 and completed 07/25/2016 3. Adjuvant antiestrogen therapy With anastrozole 1 mg by mouth daily started 08/09/2016  Ultrasound today: Patient with past history of total abdominal hysterectomy as a result of symptomatically leiomyomatous uteri. Left ovary was normal and right ovary had a solid mass in the body of the right ovary with calcification measuring 12 x 10 mm. Positive color flow to the mass was noted.  Assessment/plan: Patient with history of T1cN0 (stage 1A), intraductal carcinoma left breast with a screening ultrasound today demonstrating a calcified area in the right ovary. A CA 125 will be drawn today its limitations discussed with the patient. Will follow-up with an ultrasound in 6 months. Patient was otherwise asymptomatic. This may be a benign feature otherwise she was reassured.

## 2016-11-02 ENCOUNTER — Other Ambulatory Visit: Payer: Self-pay | Admitting: Anesthesiology

## 2016-11-02 DIAGNOSIS — Z1211 Encounter for screening for malignant neoplasm of colon: Secondary | ICD-10-CM | POA: Diagnosis not present

## 2016-11-02 LAB — LIPID PANEL
Cholesterol: 217 mg/dL — ABNORMAL HIGH (ref <200)
HDL: 57 mg/dL (ref >50)
LDL Cholesterol: 140 mg/dL — ABNORMAL HIGH (ref <100)
Total CHOL/HDL Ratio: 3.8 Ratio (ref <5.0)
Triglycerides: 101 mg/dL (ref <150)
VLDL: 20 mg/dL (ref <30)

## 2016-11-02 LAB — VITAMIN D 25 HYDROXY (VIT D DEFICIENCY, FRACTURES): Vit D, 25-Hydroxy: 63 ng/mL (ref 30–100)

## 2016-11-02 LAB — CA 125: CA 125: 3 U/mL (ref <35)

## 2016-11-07 NOTE — Assessment & Plan Note (Signed)
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 Systemic chemotherapy with dose dense Adriamycin and Cytoxan 4 followed by Abraxane weekly 12  started 12/29/2015 and completed 05/10/2016 2. Adjuvant radiation therapy started 06/14/2016 and completed 07/25/2016 3. Adjuvant antiestrogen therapy With anastrozole 1 mg by mouth daily started 08/09/2016  Anastrozole Toxicities:  Chemo induced peripheral neuropathy:  RTC in 6 months

## 2016-11-08 ENCOUNTER — Ambulatory Visit (HOSPITAL_BASED_OUTPATIENT_CLINIC_OR_DEPARTMENT_OTHER): Payer: BLUE CROSS/BLUE SHIELD | Admitting: Hematology and Oncology

## 2016-11-08 ENCOUNTER — Encounter: Payer: Self-pay | Admitting: Hematology and Oncology

## 2016-11-08 DIAGNOSIS — G620 Drug-induced polyneuropathy: Secondary | ICD-10-CM | POA: Diagnosis not present

## 2016-11-08 DIAGNOSIS — Z170 Estrogen receptor positive status [ER+]: Secondary | ICD-10-CM

## 2016-11-08 DIAGNOSIS — C50412 Malignant neoplasm of upper-outer quadrant of left female breast: Secondary | ICD-10-CM

## 2016-11-08 NOTE — Progress Notes (Signed)
Patient Care Team: Pcp Not In System as PCP - General  DIAGNOSIS:  Encounter Diagnosis  Name Primary?  . Malignant neoplasm of upper-outer quadrant of left breast in female, estrogen receptor positive ([place])     SUMMARY OF ONCOLOGIC HISTORY:   Breast cancer of upper-outer quadrant of left female breast ([place])   10/19/2015 Mammogram    Screening mammogram revealed left breast asymmetry 1.1 x 0.8 x 1 cm, T1 cN0 stage IA clinical stage      11/03/2015 Initial Diagnosis    Left breast biopsy 11:30 position: Invasive ductal carcinoma with DCIS, grade 2, ER 100%, PR 30%, Ki-67 90%, HER-2 negative ratio 1.44      11/22/2015 Surgery    Lt Lumpectomy: IDC 1.5 cm, 0/2 LN, grade 2, with Int Grade DCIS, ER 100%, PR 30%, Her 2 Neg Ratio 1.91, T1cN0 (stage 1A)Oncotype DX score 37, 10 year ROR 25%      12/29/2015 - 05/10/2016 Chemotherapy    Adjuvant chemotherapy with dose dense Adriamycin Cytoxan 4 followed by Abraxane weekly 10 stopped early due to neuropathy      06/14/2016 - 07/25/2016 Radiation Therapy    Adjuvant radiation therapy      08/09/2016 -  Anti-estrogen oral therapy    Anastrozole 1 mg by mouth daily       CHIEF COMPLIANT: Follow-up on anastrozole  INTERVAL HISTORY: GERLINE [name] is a 61 year old with above-mentioned history of left breast cancer who underwent lumpectomy followed by adjuvant chemotherapy and radiation and is currently on anastrozole therapy. Heather [name] denies any hot flashes or myalgias. Heather [name] has mild dizziness in the morning when Heather [name] wakes up.  REVIEW OF SYSTEMS:   Constitutional: Denies fevers, chills or abnormal weight loss Eyes: Denies blurriness of vision Ears, nose, mouth, throat, and face: Denies mucositis or sore throat Respiratory: Denies cough, dyspnea or wheezes Cardiovascular: Denies palpitation, chest discomfort Gastrointestinal:  Denies nausea, heartburn or change in bowel habits Skin: Denies abnormal skin rashes Lymphatics: Denies new  lymphadenopathy or easy bruising Neurological:Denies numbness, tingling or new weaknesses Behavioral/Psych: Mood is stable, no new changes  Extremities: No lower extremity edema Breast:  denies any pain or lumps or nodules in either breasts All other systems were reviewed with the patient and are negative.  I have reviewed the past medical history, past surgical history, social history and family history with the patient and they are unchanged from previous note.  ALLERGIES:  has No Known Allergies.  MEDICATIONS:  Current Outpatient Prescriptions  Medication Sig Dispense Refill  . anastrozole (ARIMIDEX) 1 MG tablet Take 1 tablet (1 mg total) by mouth daily. 90 tablet 3  . Ascorbic Acid (VITAMIN C) 1000 MG tablet Take 1,000 mg by mouth daily.    . calcium carbonate (OS-CAL) 600 MG TABS Take 600 mg by mouth 2 (two) times daily with a meal.    . cholecalciferol (VITAMIN D) 1000 UNITS tablet Take 1,000 Units by mouth daily.    Marland Kitchen UNABLE TO FIND Med Name: Immunocal platinum (for her immune system)    . VITAMIN E COMPLEX PO Take by mouth.     No current facility-administered medications for this visit.     PHYSICAL EXAMINATION: ECOG PERFORMANCE STATUS: 1 - Symptomatic but completely ambulatory  Vitals:   11/08/16 1201  BP: 122/65  Pulse: 79  Resp: 16  Temp: 98.3 F (36.8 C)   Filed Weights   11/08/16 1201  Weight: 134 lb 4.8 oz (60.9 kg)    GENERAL:alert, no distress and  comfortable SKIN: skin color, texture, turgor are normal, no rashes or significant lesions EYES: normal, Conjunctiva are pink and non-injected, sclera clear OROPHARYNX:no exudate, no erythema and lips, buccal mucosa, and tongue normal  NECK: supple, thyroid normal size, non-tender, without nodularity LYMPH:  no palpable lymphadenopathy in the cervical, axillary or inguinal LUNGS: clear to auscultation and percussion with normal breathing effort HEART: regular rate & rhythm and no murmurs and no lower extremity  edema ABDOMEN:abdomen soft, non-tender and normal bowel sounds MUSCULOSKELETAL:no cyanosis of digits and no clubbing  NEURO: alert & oriented x 3 with fluent speech, no focal motor/sensory deficits EXTREMITIES: No lower extremity edema BREAST: No palpable masses or nodules in either right or left breasts. No palpable axillary supraclavicular or infraclavicular adenopathy no breast tenderness or nipple discharge. (exam performed in the presence of a chaperone)  LABORATORY DATA:  I have reviewed the data as listed   Chemistry      Component Value Date/Time   NA 142 08/09/2016 1122   K 3.9 08/09/2016 1122   CO2 27 08/09/2016 1122   BUN 9.6 08/09/2016 1122   CREATININE 0.7 08/09/2016 1122      Component Value Date/Time   CALCIUM 9.5 08/09/2016 1122   ALKPHOS 95 08/09/2016 1122   AST 31 08/09/2016 1122   ALT 34 08/09/2016 1122   BILITOT 0.48 08/09/2016 1122       Lab Results  Component Value Date   WBC 3.8 (L) 08/09/2016   HGB 13.9 08/09/2016   HCT 40.9 08/09/2016   MCV 88.7 08/09/2016   PLT 221 08/09/2016   NEUTROABS 2.4 08/09/2016    ASSESSMENT & PLAN:  Breast cancer of upper-outer quadrant of left female breast (Heather [name]) Lt Lumpectomy 11/22/15: 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 Systemic chemotherapy with dose dense Adriamycin and Cytoxan 4 followed by Abraxane weekly 12  started 12/29/2015 and completed 05/10/2016 2. Adjuvant radiation therapy started 06/14/2016 and completed 07/25/2016 3. Adjuvant antiestrogen therapy With anastrozole 1 mg by mouth daily started 08/09/2016  Anastrozole Toxicities: Denies any hot flashes or myalgias. Heather [name] does have mild dizziness at times.  Chemo induced peripheral neuropathy: Improving slowly Surveillance: I recommended that Heather [name] get a mammogram within the next month.  RTC in  6 months and after that we can see her annually.  I spent 15 minutes talking to the patient of which more than half was spent in counseling and coordination of care.  No orders of the defined types were placed in this encounter.  The patient has a good understanding of the overall plan. Heather [name] agrees with it. Heather [name] will call with any problems that may develop before the next visit here.   Rulon Eisenmenger, MD 11/08/16

## 2016-11-27 ENCOUNTER — Other Ambulatory Visit (HOSPITAL_COMMUNITY): Payer: BLUE CROSS/BLUE SHIELD

## 2016-11-29 ENCOUNTER — Ambulatory Visit
Admission: RE | Admit: 2016-11-29 | Discharge: 2016-11-29 | Disposition: A | Discharge status: Home / Self Care | Payer: BLUE CROSS/BLUE SHIELD | Source: Ambulatory Visit | Attending: Hematology and Oncology | Admitting: Hematology and Oncology

## 2016-11-29 ENCOUNTER — Ambulatory Visit (HOSPITAL_BASED_OUTPATIENT_CLINIC_OR_DEPARTMENT_OTHER)
Admission: RE | Admit: 2016-11-29 | Discharge: 2016-11-29 | Disposition: A | Discharge status: Home / Self Care | Payer: BLUE CROSS/BLUE SHIELD | Source: Ambulatory Visit | Attending: Internal Medicine | Admitting: Internal Medicine

## 2016-11-29 ENCOUNTER — Other Ambulatory Visit (HOSPITAL_COMMUNITY): Payer: Self-pay | Admitting: *Deleted

## 2016-11-29 ENCOUNTER — Encounter (HOSPITAL_COMMUNITY): Payer: Self-pay | Admitting: Internal Medicine

## 2016-11-29 ENCOUNTER — Ambulatory Visit (HOSPITAL_COMMUNITY)
Admission: RE | Admit: 2016-11-29 | Discharge: 2016-11-29 | Disposition: A | Discharge status: Home / Self Care | Payer: BLUE CROSS/BLUE SHIELD | Source: Ambulatory Visit | Attending: Internal Medicine | Admitting: Internal Medicine

## 2016-11-29 VITALS — BP 126/80 | HR 69 | Wt 131.5 lb

## 2016-11-29 DIAGNOSIS — Z833 Family history of diabetes mellitus: Secondary | ICD-10-CM | POA: Diagnosis not present

## 2016-11-29 DIAGNOSIS — C50412 Malignant neoplasm of upper-outer quadrant of left female breast: Secondary | ICD-10-CM

## 2016-11-29 DIAGNOSIS — C50912 Malignant neoplasm of unspecified site of left female breast: Secondary | ICD-10-CM | POA: Diagnosis not present

## 2016-11-29 DIAGNOSIS — Z79811 Long term (current) use of aromatase inhibitors: Secondary | ICD-10-CM | POA: Diagnosis not present

## 2016-11-29 DIAGNOSIS — Z8379 Family history of other diseases of the digestive system: Secondary | ICD-10-CM | POA: Insufficient documentation

## 2016-11-29 DIAGNOSIS — Z170 Estrogen receptor positive status [ER+]: Secondary | ICD-10-CM | POA: Insufficient documentation

## 2016-11-29 DIAGNOSIS — Z8249 Family history of ischemic heart disease and other diseases of the circulatory system: Secondary | ICD-10-CM | POA: Diagnosis not present

## 2016-11-29 DIAGNOSIS — R922 Inconclusive mammogram: Secondary | ICD-10-CM | POA: Diagnosis not present

## 2016-11-29 DIAGNOSIS — Z9221 Personal history of antineoplastic chemotherapy: Secondary | ICD-10-CM | POA: Diagnosis not present

## 2016-11-29 DIAGNOSIS — I071 Rheumatic tricuspid insufficiency: Secondary | ICD-10-CM | POA: Insufficient documentation

## 2016-11-29 DIAGNOSIS — Z923 Personal history of irradiation: Secondary | ICD-10-CM | POA: Insufficient documentation

## 2016-11-29 NOTE — Progress Notes (Signed)
  Echocardiogram 2D Echocardiogram has been performed.  Tresa Res 11/29/2016, 2:23 PM

## 2016-11-29 NOTE — Progress Notes (Signed)
Patient ID: Heather [name], female   DOB: [birth year], 61 y.o.   MRN: [id number]    Cardiology Oncology Clinic Note   Referring Physician: Dr. Lindi Adie Primary Care: Dr Karle Starch Endocrinologist: Dr Toney Rakes  Primary Cardiologist: None  HPI: Heather [name] is a 61 y.o. woman with left breast cancer s/p Left lumpectomy referred to the cardio-oncology clinic by Dr. Lindi Adie.   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  She presents today for regular follow up. She is s/p Adriamycin and Cytoxan x 4 and12/12 of Abraxane. Finished Abraxane in 7/17. Finished XRT in 9/17   She feels good overall. Has no symptoms. No CP. No SOB, orthopnea, PND, lightheadedness, or dizziness.    Echo: 12/23/15 LVEF 55-60% Lat s' 10.7 cm/sec GLS -19.1 Echo 04/03/16: LVEF 60-65% lat s' 11.2 cm/s GLS -20.1% Echo 11/29/16: LVEF 60-65% lat s' 10.7 cm/s GLS -18.1% Social: Lives in [place] with husband. Does not work, but occasionally volunteers.    Past Medical History:  Diagnosis Date  . Cancer [facility])    left breast cancer DCIS  . GERD (gastroesophageal reflux disease)    not recently  . History of radiation therapy 06/14/16-07/26/16   Left Breast  . NSVD (normal spontaneous vaginal delivery)   . PONV (postoperative nausea and vomiting)     Current Outpatient Prescriptions  Medication Sig Dispense Refill  . anastrozole (ARIMIDEX) 1 MG tablet Take 1 tablet (1 mg total) by mouth daily. 90 tablet 3  . Ascorbic Acid (VITAMIN C) 1000 MG tablet Take 1,000 mg by mouth daily.    . calcium carbonate (OS-CAL) 600 MG TABS Take 600 mg by mouth 2 (two) times daily with a meal.    . cholecalciferol (VITAMIN D) 1000 UNITS tablet Take 1,000 Units by mouth daily.    Marland Kitchen UNABLE TO FIND Med Name: Immunocal platinum (for her immune system)    . VITAMIN E COMPLEX PO Take  by mouth.     No current facility-administered medications for this encounter.     No Known Allergies    Social History   Social History  . Marital status: Married    Spouse name: N/A  . Number of children: N/A  . Years of education: N/A   Occupational History  . Not on file.   Social History Main Topics  . Smoking status: Never Smoker  . Smokeless tobacco: Never Used  . Alcohol use 0.0 oz/week     Comment: occ  . Drug use: No  . Sexual activity: Yes    Birth control/ protection: Surgical     Comment: hyst, intercourse age 76, less than 5 sexual partners   Other Topics Concern  . Not on file   Social History Narrative  . No narrative on file      Family History  Problem Relation Age of Onset  . Ulcers Mother   . Cirrhosis Father   . Diabetes Brother   . Hypertension Brother   . Hypertension Sister     Vitals:   11/29/16 1444  BP: 126/80  Pulse: 69  SpO2: 100%  Weight: 131 lb 8 oz (59.6 kg)    PHYSICAL EXAM: General:  Well appearing. No respiratory difficulty HEENT: normal Neck: supple. no JVD appreciated. Carotids 2+ bilat; no bruits. No thyromegaly or nodule noted.  Cor:  PMI nondisplaced. RRR. No M/G/R Lungs: CTAB, normal effort.  Abdomen: soft, NT, ND, no HSM. No bruits or masses. +BS  Extremities: no cyanosis, clubbing, rash, edema Neuro: alert & oriented x 3, cranial nerves grossly intact. moves all 4 extremities w/o difficulty. Affect pleasant.   ASSESSMENT & PLAN:  1. Breast Cancer, Left  - Now s/p Adriamycin and Cytoxan x 4 and  Abraxane. S/p XRT -f/u mammogram today - Echo today reviewed personally. I reviewed echos personally. EF and Doppler parameters stable. I explained small possibility of very delayed toxicity from Adriamycin    Bensimhon, Daniel,MD 3:26 PM

## 2017-01-12 ENCOUNTER — Other Ambulatory Visit: Payer: Self-pay | Admitting: Hematology and Oncology

## 2017-03-14 ENCOUNTER — Encounter: Payer: Self-pay | Admitting: Gynecology

## 2017-04-22 ENCOUNTER — Telehealth: Payer: Self-pay | Admitting: Hematology and Oncology

## 2017-04-22 NOTE — Telephone Encounter (Signed)
Called pt to advise of appt chg from 7/11 to 7/16 @ 2.15pm due to [facility] No answer/no vm. Mailed r/s letter with new appt.

## 2017-05-09 ENCOUNTER — Ambulatory Visit: Payer: BLUE CROSS/BLUE SHIELD | Admitting: Hematology and Oncology

## 2017-05-14 ENCOUNTER — Ambulatory Visit (HOSPITAL_BASED_OUTPATIENT_CLINIC_OR_DEPARTMENT_OTHER): Payer: BLUE CROSS/BLUE SHIELD | Admitting: Hematology and Oncology

## 2017-05-14 DIAGNOSIS — G620 Drug-induced polyneuropathy: Secondary | ICD-10-CM

## 2017-05-14 DIAGNOSIS — Z170 Estrogen receptor positive status [ER+]: Secondary | ICD-10-CM | POA: Diagnosis not present

## 2017-05-14 DIAGNOSIS — C50412 Malignant neoplasm of upper-outer quadrant of left female breast: Secondary | ICD-10-CM | POA: Diagnosis not present

## 2017-05-14 MED ORDER — ANASTROZOLE 1 MG PO TABS: ORAL_TABLET | ORAL | 3 refills | Status: DC

## 2017-05-14 NOTE — Assessment & Plan Note (Signed)
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 Systemic chemotherapy with dose dense Adriamycin and Cytoxan 4 followed by Abraxane weekly 12 started 12/29/2015 and completed 05/10/2016 2. Adjuvant radiation therapy started 06/14/2016 and completed 07/25/2016 3. Adjuvant antiestrogen therapy With anastrozole 1 mg by mouth daily started 08/09/2016  Anastrozole Toxicities: Denies any hot flashes or myalgias. She does have mild dizziness at times.  Chemo induced peripheral neuropathy: Improving slowly Surveillance:  Mammogram 11/29/2016: Postsurgical changes, benign, breast density C  RTC in one year for follow-up

## 2017-05-14 NOTE — Progress Notes (Signed)
Patient Care Team: System, Pcp Not In as PCP - General  DIAGNOSIS:  Encounter Diagnosis  Name Primary?  . Malignant neoplasm of upper-outer quadrant of left breast in female, estrogen receptor positive ([name])     SUMMARY OF ONCOLOGIC HISTORY:   Breast cancer of upper-outer quadrant of left female breast ([name])   10/19/2015 Mammogram    Screening mammogram revealed left breast asymmetry 1.1 x 0.8 x 1 cm, T1 cN0 stage IA clinical stage      11/03/2015 Initial Diagnosis    Left breast biopsy 11:30 position: Invasive ductal carcinoma with DCIS, grade 2, ER 100%, PR 30%, Ki-67 90%, HER-2 negative ratio 1.44      11/22/2015 Surgery    Lt Lumpectomy: IDC 1.5 cm, 0/2 LN, grade 2, with Int Grade DCIS, ER 100%, 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 She is tolerating it extremely well. She does not have any major problems like hot flashes or myalgias. She complains of mild discomfort in the left breast in the lower outer quadrant where she feels it is more harder.  REVIEW OF SYSTEMS:   Constitutional: Denies fevers, chills or abnormal weight loss Eyes: Denies blurriness of vision Ears, nose, mouth, throat, and face: Denies mucositis or sore throat Respiratory: Denies cough, dyspnea or wheezes Cardiovascular: Denies palpitation, chest discomfort Gastrointestinal:  Denies nausea, heartburn or change in bowel habits Skin: Denies  abnormal skin rashes Lymphatics: Denies new lymphadenopathy or easy bruising Neurological:Denies numbness, tingling or new weaknesses Behavioral/Psych: Mood is stable, no new changes  Extremities: No lower extremity edema Breast: Skin thickening left breast related to prior radiation All other systems were reviewed with the patient and are negative.  I have reviewed the past medical history, past surgical history, social history and family history with the patient and they are unchanged from previous note.  ALLERGIES:  has No Known Allergies.  MEDICATIONS:  Current Outpatient Prescriptions  Medication Sig Dispense Refill  . anastrozole (ARIMIDEX) 1 MG tablet TAKE 1 TABLET(1 MG) BY MOUTH DAILY 90 tablet 0  . Ascorbic Acid (VITAMIN C) 1000 MG tablet Take 1,000 mg by mouth daily.    . calcium carbonate (OS-CAL) 600 MG TABS Take 600 mg by mouth 2 (two) times daily with a meal.    . cholecalciferol (VITAMIN D) 1000 UNITS tablet Take 1,000 Units by mouth daily.    Marland Kitchen UNABLE TO FIND Med Name: Immunocal platinum (for her immune system)    . VITAMIN E COMPLEX PO Take by mouth.     No current facility-administered medications for this visit.     PHYSICAL EXAMINATION: ECOG PERFORMANCE STATUS: 1 - Symptomatic but completely ambulatory  Vitals:   05/14/17 1431  BP: 122/77  Pulse: 80  Resp: 20  Temp: 97.7 F (36.5 C)   Filed Weights   05/14/17 1431  Weight: 136 lb 14.4 oz (62.1 kg)  GENERAL:alert, no distress and comfortable SKIN: skin color, texture, turgor are normal, no rashes or significant lesions EYES: normal, Conjunctiva are pink and non-injected, sclera clear OROPHARYNX:no exudate, no erythema and lips, buccal mucosa, and tongue normal  NECK: supple, thyroid normal size, non-tender, without nodularity LYMPH:  no palpable lymphadenopathy in the cervical, axillary or inguinal LUNGS: clear to auscultation and percussion with normal breathing effort HEART: regular rate & rhythm  and no murmurs and no lower extremity edema ABDOMEN:abdomen soft, non-tender and normal bowel sounds MUSCULOSKELETAL:no cyanosis of digits and no clubbing  NEURO: alert & oriented x 3 with fluent speech, no focal motor/sensory deficits EXTREMITIES: No lower extremity edema BREAST: No palpable masses or nodules in either right or left breasts. No palpable axillary supraclavicular or infraclavicular adenopathy no breast tenderness or nipple discharge. Mild skin thickening underneath the left breast (exam performed in the presence of a chaperone)  LABORATORY DATA:  I have reviewed the data as listed   Chemistry      Component Value Date/Time   NA 142 08/09/2016 1122   K 3.9 08/09/2016 1122   CO2 27 08/09/2016 1122   BUN 9.6 08/09/2016 1122   CREATININE 0.7 08/09/2016 1122      Component Value Date/Time   CALCIUM 9.5 08/09/2016 1122   ALKPHOS 95 08/09/2016 1122   AST 31 08/09/2016 1122   ALT 34 08/09/2016 1122   BILITOT 0.48 08/09/2016 1122       Lab Results  Component Value Date   WBC 3.8 (L) 08/09/2016   HGB 13.9 08/09/2016   HCT 40.9 08/09/2016   MCV 88.7 08/09/2016   PLT 221 08/09/2016   NEUTROABS 2.4 08/09/2016    ASSESSMENT & PLAN:  Breast cancer of upper-outer quadrant of left female breast ([name]) Lt Lumpectomy 11/22/15: 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 Systemic chemotherapy with dose dense Adriamycin and Cytoxan 4 followed by Abraxane weekly 12 started 12/29/2015 and completed 05/10/2016 2. Adjuvant radiation therapy started 06/14/2016 and completed 07/25/2016 3. Adjuvant antiestrogen therapy With anastrozole 1 mg by mouth daily started 08/09/2016  Anastrozole Toxicities: Denies any hot flashes or myalgias.   Chemo induced peripheral neuropathy: Improving slowly Surveillance:  Mammogram 11/29/2016:  Postsurgical changes, benign, breast density C Breast exam 05/14/2017: Post radiation changes otherwise no significant abnormalities.  RTC in one year for follow-up  I spent 15 minutes talking to the patient of which more than half was spent in counseling and coordination of care.  No orders of the defined types were placed in this encounter.  The patient has a good understanding of the overall plan. she agrees with it. she will call with any problems that may develop before the next visit here.   Rulon Eisenmenger, MD 05/14/17

## 2017-07-15 IMAGING — MG MM DIGITAL DIAGNOSTIC UNILAT*L* W/ TOMO W/ CAD
6 series · 6 of 14 positions shown · non-contrast
Comparison: Previous exams including recent screening mammogram
dated 10/19/2015.

ADDENDUM:
Left axilla was also evaluated with ultrasound. No enlarged or
morphologically abnormal lymph nodes are identified within the left
axilla.
CLINICAL DATA: Patient returns today to evaluate a possible left
breast asymmetry identified on recent screening mammogram

EXAM:
DIGITAL DIAGNOSTIC LEFT MAMMOGRAM WITH 3D TOMOSYNTHESIS WITH CAD
ULTRASOUND LEFT BREAST

[L CC]
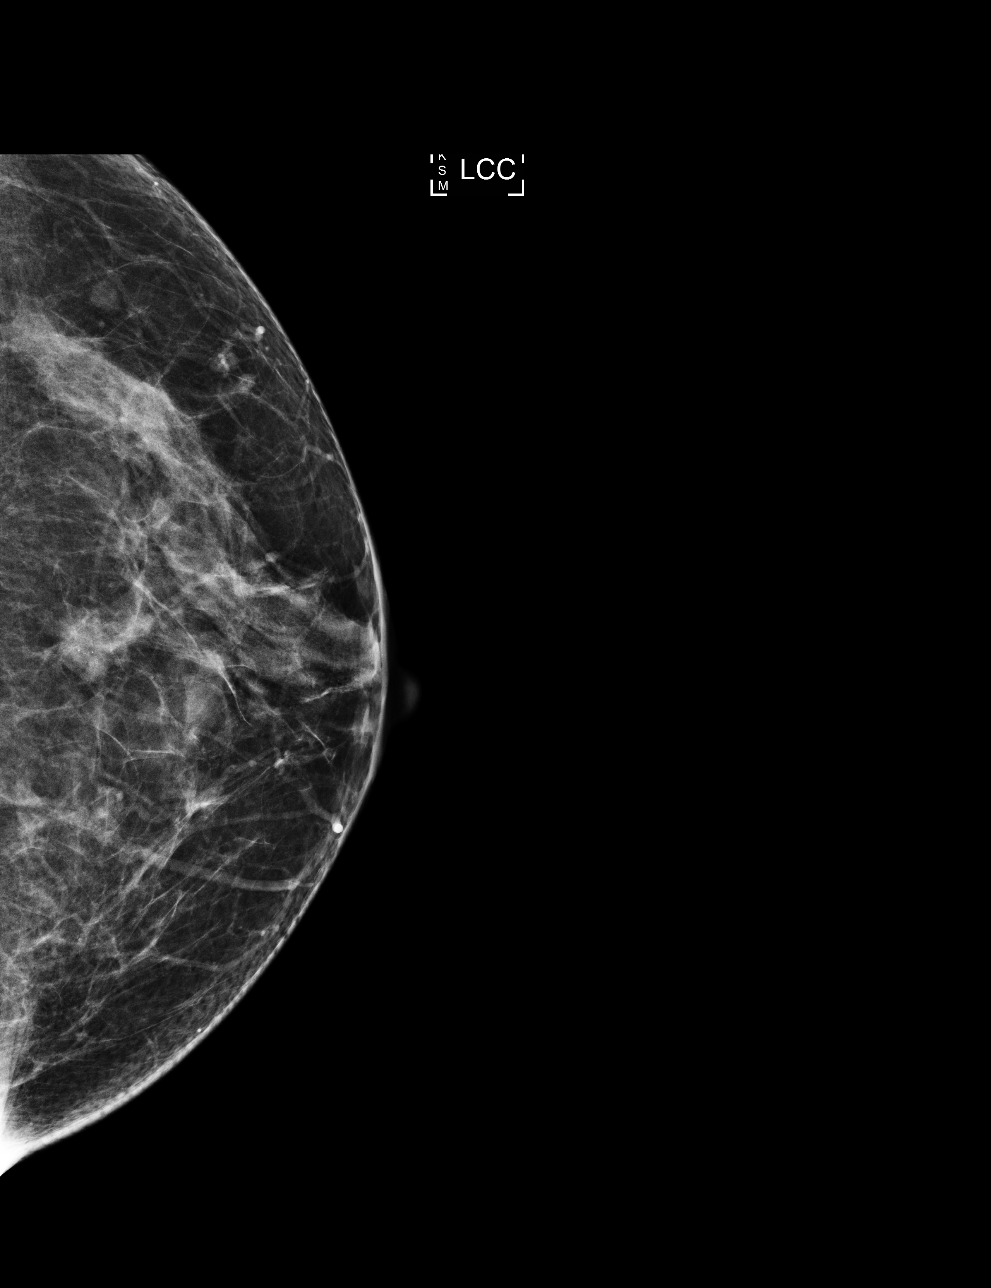

[L MLO]
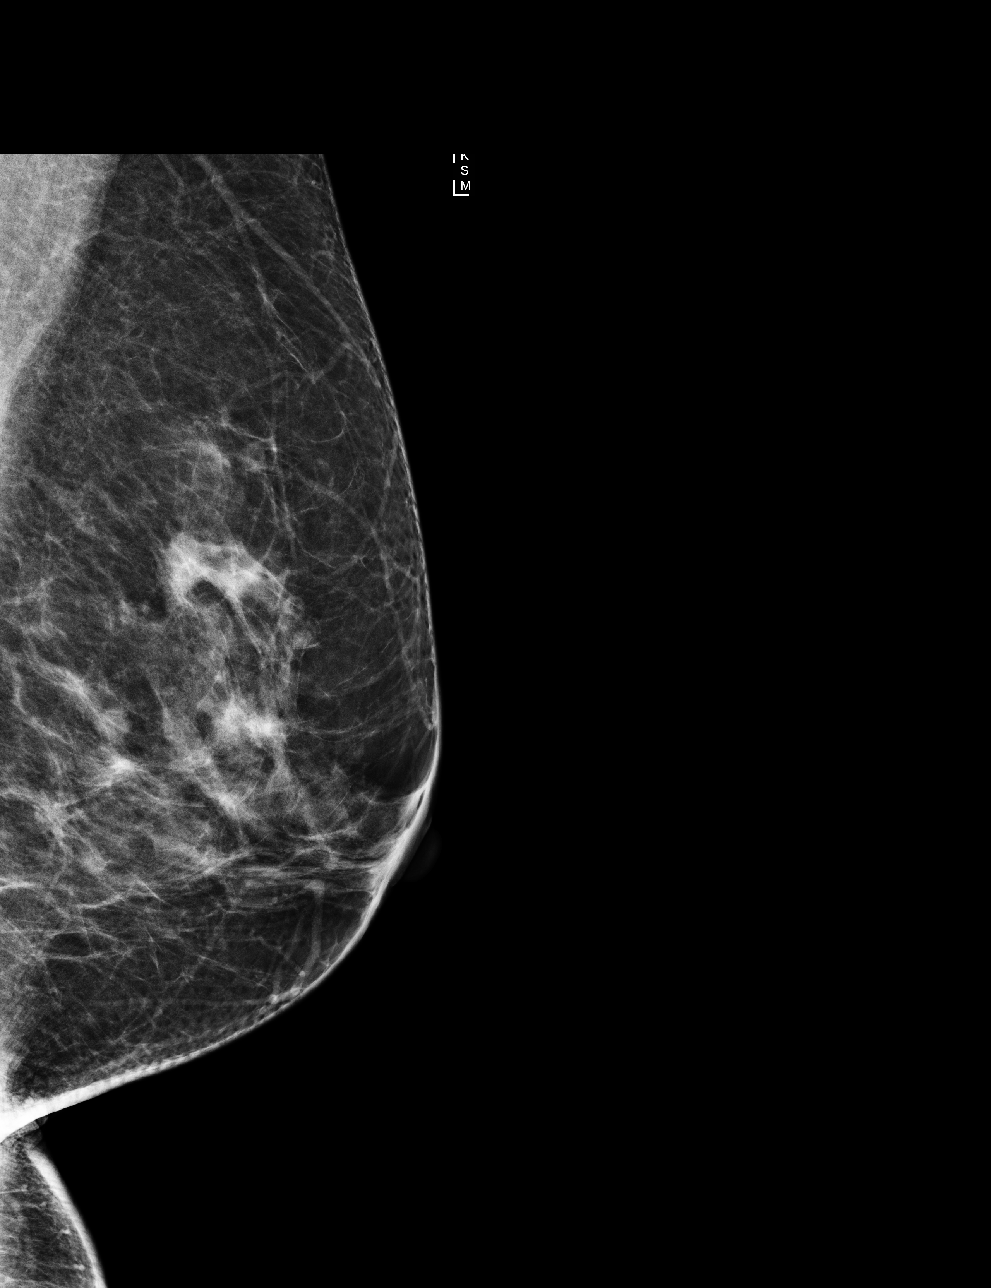

[L MLO tomo (1 of 2)]
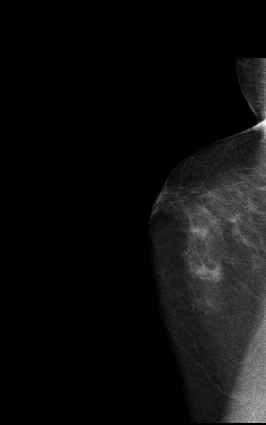

[L CC tomo (1 of 2)]
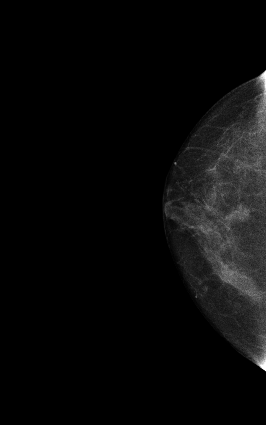

[L MLO tomo (2 of 2) · tomo slice 35/69.0]
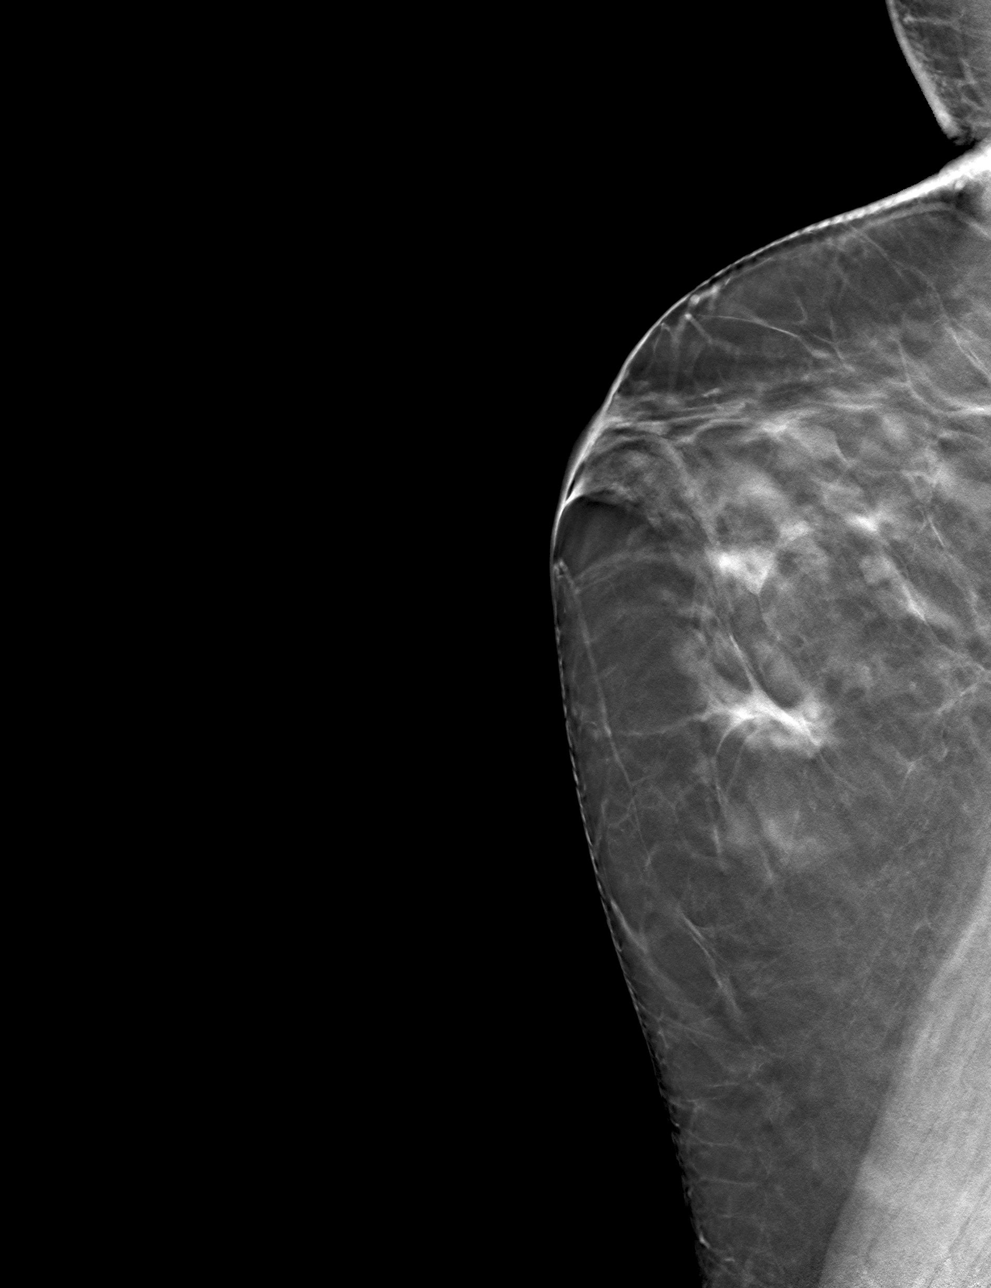

[L CC tomo (2 of 2) · tomo slice 35/68.0]
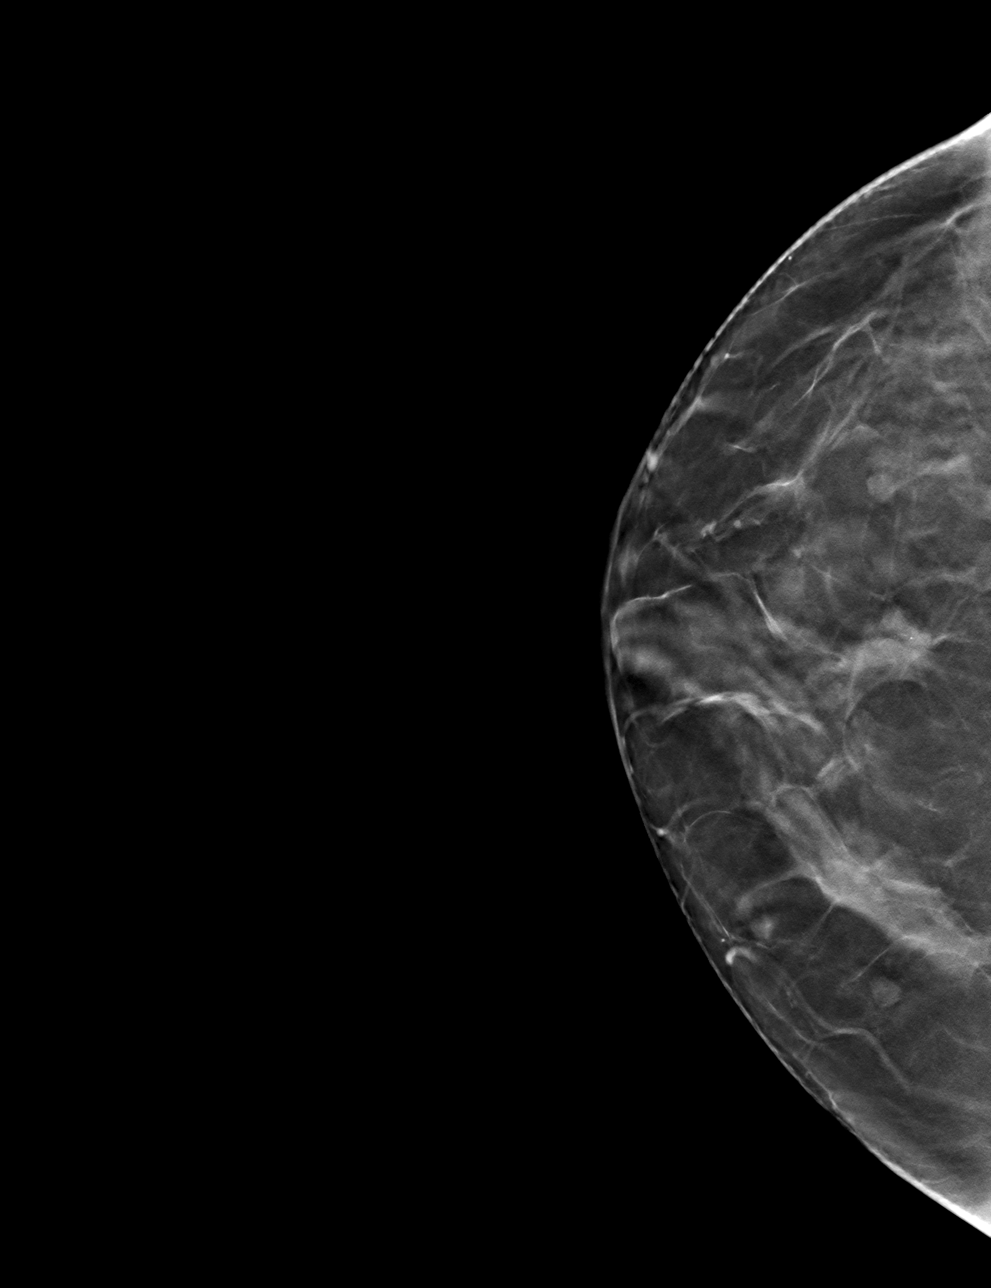

[6 of 14 positions shown; findings below may reference images not displayed]

ACR Breast Density Category c: The breast tissue is heterogeneously
dense, which may obscure small masses.
FINDINGS: On today's additional views of the left breast with tomosynthesis,
there is an irregular mass with associated distortion confirmed at
the 12 o'clock axis, at posterior depth, measuring approximately
cm greatest dimension, best seen on tomosynthesis CC slice 38 and
MLO slice 41.

Mammographic images were processed with CAD.

Targeted ultrasound is performed, showing an irregular hypoechoic
mass within the left breast at the 11:30 o'clock axis, 4 cm from the
nipple, with echogenic halo, measuring 1.1 x 0.8 x 1 cm,
corresponding to the mammographic finding.
IMPRESSION: Irregular mass within the left breast at the 11:30 o'clock axis, 4
cm from the nipple, measuring 1.1 x 0.8 x 1 cm. This is a highly
suspicious finding for which ultrasound-guided core biopsy is
recommended.

RECOMMENDATION:
Ultrasound-guided core biopsy for the left breast mass.

Ultrasound-guided biopsy is scheduled for [REDACTED].

I have discussed the findings and recommendations with the patient.
Results were also provided in writing at the conclusion of the
visit. If applicable, a reminder letter will be sent to the patient
regarding the next appointment.

BI-RADS CATEGORY  5: Highly suggestive of malignancy.

## 2017-10-09 ENCOUNTER — Other Ambulatory Visit: Payer: Self-pay | Admitting: Hematology and Oncology

## 2017-10-09 DIAGNOSIS — Z853 Personal history of malignant neoplasm of breast: Secondary | ICD-10-CM

## 2017-10-10 ENCOUNTER — Telehealth: Payer: Self-pay | Admitting: Hematology and Oncology

## 2017-10-10 NOTE — Telephone Encounter (Signed)
Patient called in to reschedule her appointment °

## 2017-11-20 DIAGNOSIS — M545 Low back pain: Secondary | ICD-10-CM | POA: Diagnosis not present

## 2017-11-22 ENCOUNTER — Ambulatory Visit (INDEPENDENT_AMBULATORY_CARE_PROVIDER_SITE_OTHER): Payer: BLUE CROSS/BLUE SHIELD | Admitting: Obstetrics & Gynecology

## 2017-11-22 ENCOUNTER — Encounter: Payer: Self-pay | Admitting: Obstetrics & Gynecology

## 2017-11-22 VITALS — BP 128/72 | Ht 59.5 in | Wt 133.0 lb

## 2017-11-22 DIAGNOSIS — Z90710 Acquired absence of both cervix and uterus: Secondary | ICD-10-CM

## 2017-11-22 DIAGNOSIS — Z13820 Encounter for screening for osteoporosis: Secondary | ICD-10-CM | POA: Diagnosis not present

## 2017-11-22 DIAGNOSIS — N839 Noninflammatory disorder of ovary, fallopian tube and broad ligament, unspecified: Secondary | ICD-10-CM | POA: Diagnosis not present

## 2017-11-22 DIAGNOSIS — C50412 Malignant neoplasm of upper-outer quadrant of left female breast: Secondary | ICD-10-CM

## 2017-11-22 DIAGNOSIS — Z780 Asymptomatic menopausal state: Secondary | ICD-10-CM | POA: Diagnosis not present

## 2017-11-22 DIAGNOSIS — Z01411 Encounter for gynecological examination (general) (routine) with abnormal findings: Secondary | ICD-10-CM

## 2017-11-22 DIAGNOSIS — Z170 Estrogen receptor positive status [ER+]: Secondary | ICD-10-CM

## 2017-11-22 DIAGNOSIS — N838 Other noninflammatory disorders of ovary, fallopian tube and broad ligament: Secondary | ICD-10-CM

## 2017-11-22 NOTE — Progress Notes (Signed)
Heather [name] 1956-03-03 [id number]   History:    62 y.o. G2P2L2  Married.  2 daughters 62 and 30 yo.  3 grand-children.  RP:  Established patient presenting for annual gyn exam   HPI:  Menopause, well on no HRT.  Left Breast Ca on Anastrazole.  She was diagnosed in 2017 with invasive ductal carcinoma of her left breast where she underwent a lumpectomy in January 23 of 2017. She had systemic chemotherapy with dose dense Adriamycin and Cytoxan 4 followed by Abraxane weekly 12 started 12/29/2015 and completed 05/10/2016.  Then  adjuvant radiation therapy started 06/14/2016 and completed 07/25/2016.  And now on adjuvant antiestrogen therapy with anastrozole 1 mg by mouth daily started 08/09/2016.  No pelvic pain.  A pelvic US done on 10/2016 showed a small solid mass in the body of the right ovary with calcification measuring 12 x 10 mm. Positive color flow to the mass was noted.  A Ca125 was done on 11/01/2016 and came back normal at 3.  Urine and BMs wnl.  Fit and eating well.  Past medical history,surgical history, family history and social history were all reviewed and documented in the EPIC chart.  Gynecologic History No LMP recorded. Patient has had a hysterectomy. Contraception: status post hysterectomy Last Pap: 2011. Results were: normal Last mammogram: 10/2016. Results were: Benign Bone density 12/2014 Osteopenia at Spine T-Score -1.7 [place] 04/2014  Obstetric History OB History  Gravida Para Term Preterm AB Living  2 2 2     2   SAB TAB Ectopic Multiple Live Births          2    # Outcome Date GA Lbr Len/2nd Weight Sex Delivery Anes PTL Lv  2 Term     F CS-Unspec  N LIV  1 Term     F Vag-Spont  N LIV       ROS: A ROS was performed and pertinent positives and negatives are included in the history.  GENERAL: No fevers or chills. HEENT: No change in vision, no earache, sore throat or sinus congestion. NECK: No pain or stiffness. CARDIOVASCULAR: No chest pain or pressure. No  palpitations. PULMONARY: No shortness of breath, cough or wheeze. GASTROINTESTINAL: No abdominal pain, nausea, vomiting or diarrhea, melena or bright red blood per rectum. GENITOURINARY: No urinary frequency, urgency, hesitancy or dysuria. MUSCULOSKELETAL: No joint or muscle pain, no back pain, no recent trauma. DERMATOLOGIC: No rash, no itching, no lesions. ENDOCRINE: No polyuria, polydipsia, no heat or cold intolerance. No recent change in weight. HEMATOLOGICAL: No anemia or easy bruising or bleeding. NEUROLOGIC: No headache, seizures, numbness, tingling or weakness. PSYCHIATRIC: No depression, no loss of interest in normal activity or change in sleep pattern.     Exam:   BP 128/72   Ht 4' 11.5" (1.511 m)   Wt 133 lb (60.3 kg)   BMI 26.41 kg/m   Body mass index is 26.41 kg/m.  General appearance : Well developed well nourished female. No acute distress HEENT: Eyes: no retinal hemorrhage or exudates,  Neck supple, trachea midline, no carotid bruits, no thyroidmegaly Lungs: Clear to auscultation, no rhonchi or wheezes, or rib retractions  Heart: Regular rate and rhythm, no murmurs or gallops Breast:Examined in sitting and supine position were symmetrical in appearance, no palpable masses or tenderness,  no skin retraction, no nipple inversion, no nipple discharge, no skin discoloration, no axillary or supraclavicular lymphadenopathy Abdomen: no palpable masses or tenderness, no rebound or guarding Extremities: no edema or  skin discoloration or tenderness  Pelvic: Vulva normal  Bartholin, Urethra, Skene Glands: Within normal limits             Vagina: No gross lesions or discharge.  Pap reflex done  Cervix/Uterus absent  Adnexa  Without masses or tenderness  Anus and perineum  normal   Assessment/Plan:  62 y.o. female for annual exam   1. Encounter for gynecological examination with abnormal finding GYN exam status post hysterectomy.  Pap reflex done on vaginal vault today.  Breast  exam normal.  Screening mammogram scheduled for February 2019.  Health labs with family physician.  2. S/P TAH (total abdominal hysterectomy)   3. Malignant neoplasm of upper-outer quadrant of left breast in female, estrogen receptor positive ([place]) On Anastrozole.  Normal breast exam today.  Has screening mammogram scheduled for February 2019.  4. Menopause present Patient desires verification of menopausal status.  Will do [place] today. - FSH  5. Ovarian mass, right Had a small solid nodule with calcification on the right ovary on pelvic ultrasound January 2018.  Ca1 25 was at 3 at that time.  Will recheck a Ca1 25 level today and follow-up for pelvic ultrasound to reassess. - CA 125 - US Transvaginal Non-OB; Future  6. Screening for osteoporosis Recommend vitamin D supplements, calcium rich nutrition and regular weightbearing physical activity.  Follow-up for bone density. - DG Bone Density; Future  Princess Bruins MD, 11:58 AM 11/22/2017

## 2017-11-22 NOTE — Patient Instructions (Signed)
1. Encounter for gynecological examination with abnormal finding GYN exam status post hysterectomy.  Pap reflex done on vaginal vault today.  Breast exam normal.  Screening mammogram scheduled for February 2019.  Health labs with family physician.  2. S/P TAH (total abdominal hysterectomy)   3. Malignant neoplasm of upper-outer quadrant of left breast in female, estrogen receptor positive ([place]) On Anastrozole.  Normal breast exam today.  Has screening mammogram scheduled for February 2019.  4. Menopause present Patient desires verification of menopausal status.  Will do [place] today. - FSH  5. Ovarian mass, right Had a small solid nodule with calcification on the right ovary on pelvic ultrasound January 2018.  Ca1 25 was at 3 at that time.  Will recheck a Ca1 25 level today and follow-up for pelvic ultrasound to reassess. - CA 125 - US Transvaginal Non-OB; Future  6. Screening for osteoporosis Recommend vitamin D supplements, calcium rich nutrition and regular weightbearing physical activity.  Follow-up for bone density. - DG Bone Density; Future  Jayma, fue un placer conocerle hoy!  Voy a informarle de sus [employer].   Mantenimiento de la salud de las mujeres posmenopusicas (Health Maintenance for Postmenopausal Women) La menopausia es un proceso normal en el cual se pierde la capacidad reproductiva. Este proceso ocurre gradualmente a lo largo de un perodo de meses o aos, por lo general entre los 25 y los 55aos. La menopausia es completa cuando no se han tenido 20mnstruaciones consecutivas. Es importante hablar con el mdico sobre algunas de las enfermedades ms comunes que afectan a las mujeres posmenopusicas, como la cardiopata coronaria, el cncer y la prdida de la masa sea (osteoporosis). Adoptar un estilo de vida saludable y recibir atencin preventiva pueden ayudar a promover la salud y eMusician Adems, estas medidas pueden reducir las probabilidades de  desarrollar algunas de estas enfermedades frecuentes. QU DEBO SABER ACERCA DE LKerens Durante le mGoltry puede tener una serie de sntomas, por ejemplo:  Calores repentinos moderados a graves.  Sudoracin nocturna.  Disminucin del deseo sexual.  Cambios en el estado de nimo.  Dolores de cN[place]  Cansancio.  Irritabilidad.  Problemas de memoria.  Insomnio. Tratar o no los cambios que ocurren en la menopausia es una decisin personal que se toma con el mdico. QU DEBO SABER SOBRE LOS TRATAMIENTOS DE REPOSICIN HORMONAL Y LOS SUPLEMENTOS? Los productos para la terapia hormonal son eficaces para tratar los sntomas que se asocian con la menopausia, como los calores repentinos y las sudoraciones nocturnas. La reposicin hormonal conlleva ciertos riesgos, especialmente a medida que una mujer envejece. Si est pensando en usar tratamientos con estrgeno o estrgeno con progesterona, analice los beneficios y los riesgos con el mdico. QU DEBO SABER SMilesLPacific A medida que una persona envejece, aumenta la probabilidad de tener cardiopata coronaria, infarto de miocardio e ictus. Esto puede deberse, en parte, a los cambios hormonales que atraviesa el cuerpo durante la menopausia. Estos cambios pueden afectar la forma en que el organismo procesa las gMount Union los triglicridos y el colesterol de su dieta. El infarto de miocardio y el ictus son emergencias mdicas. Hay muchas cosas que se pueden hacer para ayudar a prevenir la cardiopata coronaria y el ictus:  Debe controlar su presin arterial al menos cada uno o dEvadale La hipertensin arterial causa enfermedades cardacas y aSerbiael riesgo de ictus.  Si tiene entre 582y 79aos, consulte al mdico si debe tomar aspirina para prevenir un infarto  de miocardio o un ictus.  No consuma ningn producto que contenga tabaco, lo que incluye cigarrillos, tabaco de Higher education careers adviser o Psychologist, sport and exercise.  Si necesita ayuda para dejar de fumar, consulte al Mead[employer]vaco.  Es importante seguir una dieta sana y Theatre manager un peso saludable. ? Asegrese de [employer] Stores verduras, frutas, productos lcteos de bajo contenido de [place] y Advertising account planner. ? No consuma alimentos con alto contenido de grasas slidas, azcares agregados o sal (sodio).  Realice actividad fsica con regularidad. Esta es una de las cosas ms importantes que puede hacer por su salud. ? Intente realizar al menos 168mnutos de actividad fsica por semana. El tipo de ejercicio que realice debe aumentar la frecuencia cardaca y hacerla sudar. Esto se conoce como ejercicio de iMal[place] ? Intente hacer ejercicios de elongacin por lo menos dos veces por semana. Agrguelos al plan de ejercicio de intensidad moderada.  Conozca sus cifras. Pdale al mdico que le controle el colesterol y el nivel sanguneo de glucosa. Siga hacindose anlisis de sAmerican Electric Powerse lo haya indicado el mdico. QU DEBO SABER SOBRE LAS PRUEBAS DE DETECCIN DEL CNCER? Hay varios tipos de cncer. Tome las siguientes medidas para reducir el riesgo y dActuaryformacin cancerosa lo antes posible. Cncer de mama  Practique la autoconciencia de la mama. ? Esto significa reconocer la apariencia normal de sus mamas y cmo las siente. ? Tambin significa realizar autoexmenes regulares de las m[place] Informe a su mdico sobre cualquier cambio, sin importar cun pequeo sea.  Si es mayor de 40aos, visite a un mdico para qPublic librarianun examen de las mamas (exploracin clnica mamaria o ECM) todos los aWaverly Hall En funcin de sToysRus los antecedentes familiares y la historia cStarks tal vez sea recomendable que tambin se haga una radiografa anual de las mamas ([place].  Si tiene antecedentes familiares de cncer de mama, hable con el mdico para someterse a un estudio gentico.  Si tiene alto riesgo de pChief Financial Officerde mama, hable con el mdico  para hacerse a uPublic house manager(RM) y uLavinia Sharpstodos los aCap[place]  La evaluacin del gen del cncer de mama (BRCA) se recomienda a las mujeres que tengan familiares con tumores malignos relacionados con el BRCA. Los resultados de la evaluacin determinarn la necesidad de recibir asesoramiento gentico y pBuilding services engineerde deteccin del BRCA1 y el BRCA2. Los tumores malignos relacionados con el BRCA incluyen estos tipos: ? Mama. Este tipo se presenta en hombres o mujeres. ? Ovario. ? Trompas. A este tipo tambin se lo llama cncer de trompa de Falopio. ? Cncer de la pared abdominal o plvica (cncer de peritoneo). ? Prstata. ? Pncreas. Cncer de cuello uterino, de tero y de ovario El mdico puede recomendarle que se haga pruebas peridicas de deteccin de cncer de los rganos de la pelvis, los cuales iVerizonovarios, el tero y la vagina. Estas pruebas incluyen un examen plvico, que abarca controlar si se produjeron cambios microscpicos en la superficie del cuello del tero (prueba de Papanicolaou).  A las mujeres que tCircuit City21 y 627aos los mdicos pueden recomendarles que se realicen un examen plvico y uArdelia Memsprueba de Papanicolaou cada tres aos. A las mujeres que tienen entre 30 y 65aos, pueden recomendarles la prueba de Papanicolaou y el examen plvico, en combinacin con una prueba de deteccin del virus del papiloma humano (VPH) cClear Lake Algunos tipos de VPH aumentan el riesgo de pChief Financial Officerde cuello del tero. La prueba para la deteccin  del VPH tambin puede realizarse a mujeres de cualquier edad cuyos resultados de la prueba de Papanicolaou no sean claros.  Es posible que otros mdicos no recomienden exmenes de deteccin a las mujeres no embarazadas que se consideran sujetos de bajo riesgo de Chief Financial Officer de pelvis y no tienen sntomas. Pregntele al mdico si un examen plvico de deteccin es adecuado para usted.  Si ha recibido un tratamiento para Medical illustrator cervical o una enfermedad que podra causar cncer, necesitar realizarse una prueba de Papanicolaou y controles durante al menos 70 aos de concluido el [place]. Si no se ha hecho el Papanicolaou con regularidad, debern volver a evaluarse los factores de riesgo (como tener un nuevo compaero sexual), para Teacher, adult education si debe empezar a Dispensing optician los estudios nuevamente. Algunas mujeres sufren problemas mdicos que aumentan la probabilidad de Museum/gallery curator cncer de cuello del tero. En estos casos, el mdico podr [employer] se realicen controles y pruebas de Papanicolaou con ms frecuencia.  Si tiene antecedentes familiares de cncer de tero o de ovario, hable con el mdico para someterse a un estudio gentico.  Si tiene hemorragia vaginal despus de la menopausia, informe al mdico.  En la actualidad, no hay pruebas confiables para la deteccin del cncer de ovario. Cncer de pulmn Se recomienda realizar exmenes de deteccin de cncer de pulmn a personas adultas entre 52 y 44 aos que estn en riesgo de Horticulturist, commercial de pulmn por sus antecedentes de consumo de tabaco. Se recomienda una tomografa computarizada (TC) de baja dosis de los pulmones todos los aos si usted:  Fuma actualmente.  Ha fumado durante 30aos un paquete diario y sigue fumando o dej el hbito en algn momento en los ltimos 15aos. Un paquete-ao equivale a fumar en promedio un paquete de cigarrillos diario durante un ao. Los exmenes de deteccin anuales:  Deben hacerse hasta que hayan pasado 15aos desde que dej de fumar.  Deben dejar de realizarse si tiene un problema de salud que le impide recibir tratamiento para el cncer de pulmn. Cncer colorrectal  Este tipo de cncer puede detectarse y a menudo prevenirse.  El estudio de [place] de Programme researcher, broadcasting/film/video del cncer colorrectal debe comenzar a Electrical engineer a Proofreader de los 78aos y [place].  El mdico puede aconsejarle que lo haga antes, si tiene  factores de riesgo de Best boy cncer de colon.  Si tiene antecedentes familiares de cncer colorrectal, hable con el mdico para someterse a un estudio gentico.  El mdico tambin puede recomendarle que use un kit de prueba para Engineer, mining a fin de Educational psychologist en la materia fecal.  Es posible que se use una pequea cmara en el extremo de un tubo para examinar directamente el colon (sigmoidoscopia o colonoscopia) a fin de Hydrographic surveyor formas tempranas de cncer colorrectal.  El examen directo del colon se debe repetir cada 5 a 10aos hasta los 65aos. Sin embargo, si se hallan formas incipientes de plipos precancerosos o pequeos tumores, o si tiene antecedentes familiares o riesgo gentico de Therapist, music, debe realizarse exmenes de deteccin con ms frecuencia. Cncer de piel  Revise la piel de la cabeza a los pies con regularidad.  Contrlese los lunares. Infrmele al mdico: ? Si aparecen nuevos lunares o los que tiene se modifican, especialmente en su forma o color. ? Si tiene un lunar que es ms grande que el tamao de una goma de Games developer.  Si alguno de los miembros de su familia tiene antecedentes de cncer de  piel, especialmente a una edad temprana, hable con el mdico para someterse a pruebas genticas.  Siempre use pantalla solar. Aplique pantalla solar de Kerry Dory y repetida a lo largo del Training and development officer.  Protjase usando mangas y [employer], un sombrero de ala ancha y gafas para el sol, siempre que est al [place]. QU DEBO SABER SOBRE LA OSTEOPOROSIS? La osteoporosis es una afeccin en la cual la destruccin de la masa sea ocurre con mayor rapidez que su formacin. Despus de la menopausia, puede correr un riesgo ms alto de tener osteoporosis. Para ayudar a prevenir esta afeccin o las fracturas seas que pueden ocurrir a causa de [place], se recomienda lo siguiente:  Si tiene entre 19 y 50aos, tome como mnimo 1052m de calcio y 6039mde vitaminaD  por daTraining and development officer Si es mayor de 50aos pero menor de 70aos, tome como mnimo 120035me calcio y 600m39m vitaminaD por da. Training and development officeri es mayor de 70aos, tome como mnimo 1200mg79mcalcio y 800mg 24mitaminaD por da. ElTraining and development officerabaquismo y el consumo excesivo de alcohol aumentan el riesgo de osteoporosis. Consuma alimentos ricos en calcio y vitaminaD, y haga ejercicios con soporte de peso varias veces a la semana, como se lo haya indicado el mdico. QU DEBO SABER SOBRE EL MODO EN QUE LA MENOPAUSIA AFECTA MI SALUD MENTAL? La depresin puede presentarse a cualquier edad, pero es ms frecuente a medida que una persona envejece. Los sntomas comunes de depresin incluyen lo siguiente:  Desnimo o tristeza.  Cambios en los patrones de sueo.  Cambios en el apetito o en los hbitos de alimentacin.  Sensacin de falta general de motivacin o placer al realizYahooidades que sola disfrutar.  Crisis frecuentes de llanto. Hable con el mdico si cree que tiene depresin. QU DEBO SABER SOBRE LAS VACUNAS? Es importante que se aplique las vacunas y las maGrawns incluyen las siguientes:  Vacuna contra el ttanos, la difteria y la tosferina (Tdap).  Vacuna anual contra la gripe antes del inicio de la temporada de gripe.  Vacuna contra la neumona.  Vacuna contra el herpes. El mdico tambin puede recomendarle que se aplique otras vacunas. Esta informacin no tiene como fMarine scientistnsejo del mdico. Asegrese de hacerle al mdico cualquier pregunta que tenga. Document Released: 08/06/2013 Document Revised: 11/06/2014 Document Reviewed: 07/20/2015 Elsevier Interactive Patient Education  2018 Else[employer]

## 2017-11-23 LAB — CA 125: CA 125: 5 U/mL (ref <35)

## 2017-11-23 LAB — FOLLICLE STIMULATING HORMONE: FSH: 79 m[IU]/mL

## 2017-11-26 LAB — PAP IG W/ RFLX HPV ASCU

## 2017-11-27 ENCOUNTER — Encounter: Payer: Self-pay | Admitting: *Deleted

## 2017-11-29 ENCOUNTER — Other Ambulatory Visit: Payer: Self-pay | Admitting: Gynecology

## 2017-11-29 DIAGNOSIS — Z13820 Encounter for screening for osteoporosis: Secondary | ICD-10-CM

## 2017-11-30 DIAGNOSIS — M8580 Other specified disorders of bone density and structure, unspecified site: Secondary | ICD-10-CM

## 2017-11-30 HISTORY — DX: Other specified disorders of bone density and structure, unspecified site: M85.80

## 2017-12-03 ENCOUNTER — Ambulatory Visit
Admission: RE | Admit: 2017-12-03 | Discharge: 2017-12-03 | Disposition: A | Discharge status: Home / Self Care | Payer: BLUE CROSS/BLUE SHIELD | Source: Ambulatory Visit | Attending: Hematology and Oncology | Admitting: Hematology and Oncology

## 2017-12-03 DIAGNOSIS — Z853 Personal history of malignant neoplasm of breast: Secondary | ICD-10-CM

## 2017-12-03 DIAGNOSIS — R922 Inconclusive mammogram: Secondary | ICD-10-CM | POA: Diagnosis not present

## 2017-12-13 ENCOUNTER — Other Ambulatory Visit: Payer: Self-pay | Admitting: Gynecology

## 2017-12-13 ENCOUNTER — Ambulatory Visit (INDEPENDENT_AMBULATORY_CARE_PROVIDER_SITE_OTHER): Payer: BLUE CROSS/BLUE SHIELD

## 2017-12-13 DIAGNOSIS — Z13820 Encounter for screening for osteoporosis: Secondary | ICD-10-CM

## 2017-12-13 DIAGNOSIS — M8589 Other specified disorders of bone density and structure, multiple sites: Secondary | ICD-10-CM

## 2017-12-14 ENCOUNTER — Encounter: Payer: Self-pay | Admitting: Gynecology

## 2017-12-24 DIAGNOSIS — E559 Vitamin D deficiency, unspecified: Secondary | ICD-10-CM | POA: Diagnosis not present

## 2017-12-24 DIAGNOSIS — M8589 Other specified disorders of bone density and structure, multiple sites: Secondary | ICD-10-CM | POA: Diagnosis not present

## 2017-12-24 DIAGNOSIS — Z853 Personal history of malignant neoplasm of breast: Secondary | ICD-10-CM | POA: Diagnosis not present

## 2017-12-31 DIAGNOSIS — Z0000 Encounter for general adult medical examination without abnormal findings: Secondary | ICD-10-CM | POA: Diagnosis not present

## 2017-12-31 DIAGNOSIS — E559 Vitamin D deficiency, unspecified: Secondary | ICD-10-CM | POA: Diagnosis not present

## 2017-12-31 DIAGNOSIS — Z1159 Encounter for screening for other viral diseases: Secondary | ICD-10-CM | POA: Diagnosis not present

## 2018-01-09 ENCOUNTER — Other Ambulatory Visit: Payer: Self-pay | Admitting: Obstetrics & Gynecology

## 2018-01-09 ENCOUNTER — Ambulatory Visit: Payer: BLUE CROSS/BLUE SHIELD | Admitting: Obstetrics & Gynecology

## 2018-01-09 ENCOUNTER — Ambulatory Visit (INDEPENDENT_AMBULATORY_CARE_PROVIDER_SITE_OTHER): Payer: BLUE CROSS/BLUE SHIELD

## 2018-01-09 DIAGNOSIS — Z170 Estrogen receptor positive status [ER+]: Secondary | ICD-10-CM | POA: Diagnosis not present

## 2018-01-09 DIAGNOSIS — N9489 Other specified conditions associated with female genital organs and menstrual cycle: Secondary | ICD-10-CM

## 2018-01-09 DIAGNOSIS — D3911 Neoplasm of uncertain behavior of right ovary: Secondary | ICD-10-CM | POA: Diagnosis not present

## 2018-01-09 DIAGNOSIS — N838 Other noninflammatory disorders of ovary, fallopian tube and broad ligament: Secondary | ICD-10-CM

## 2018-01-09 DIAGNOSIS — C50412 Malignant neoplasm of upper-outer quadrant of left female breast: Secondary | ICD-10-CM | POA: Diagnosis not present

## 2018-01-09 DIAGNOSIS — N949 Unspecified condition associated with female genital organs and menstrual cycle: Secondary | ICD-10-CM | POA: Diagnosis not present

## 2018-01-09 DIAGNOSIS — N839 Noninflammatory disorder of ovary, fallopian tube and broad ligament, unspecified: Secondary | ICD-10-CM | POA: Diagnosis not present

## 2018-01-09 NOTE — Progress Notes (Signed)
    Heather [name] Sep 30, 1956 [id number]        62 y.o.  [id number] Married.  2 daughters 51 and 10 yo.  3 grand-children.  RP:  Rt Ovarian solid mass for Pelvic US  HPI: S/P Hysterectomy.  No pelvic pain.  Menopause, well on no HRT.  Left breast Ca Dxed in 2017 on Anastrazole.  A pelvic US done on 11/01/2016 showed a small solid mass in the body of the right ovary with Ca++ measuring 12 x 10 mm.  Pos CFD to the mass was noted.  Ca 125 was 3 on 11/01/2016.  The plan was to repeat a Pelvic US at 6 months, but she delayed her f/u until a year later.    OB History  Gravida Para Term Preterm AB Living  2 2 2     2   SAB TAB Ectopic Multiple Live Births          2    # Outcome Date GA Lbr Len/2nd Weight Sex Delivery Anes PTL Lv  2 Term     F CS-Unspec  N LIV  1 Term     F Vag-Spont  N LIV      Past medical history,surgical history, problem list, medications, allergies, family history and social history were all reviewed and documented in the EPIC chart.   Directed ROS with pertinent positives and negatives documented in the history of present illness/assessment and plan.  Exam:  There were no vitals filed for this visit. General appearance:  Normal  Pelvic US today: T/V images.  Status post hysterectomy.  Left ovary normal.  Right ovary with a solid mass with positive color flow Doppler within the mass and a feeder vessel to the mass.  The solid mass measures 1.8 x 1.5 x 1.6 cm which is increased in size from the prior ultrasound.  No free fluid in the posterior cul-de-sac.  Ca125 on 11/22/2017 at 5   Assessment/Plan:  62 y.o. G2P2002   1. Ovarian mass, right Right ovary with a solid mass with positive color flow Doppler.  The mass now measures 1.8 x 1.5 x 20.6 cm which is slightly increased in size compared to the pelvic ultrasound in January 2018.  Ca1 25 still normal at 5 on November 22, 2017.  The possibility of a benign tumor versus a malignant process discussed with patient.  Given  the growth of the solid mass and a positive color flow Doppler, in the context of a patient with breast cancer in menopause, decision to proceed with laparoscopic bilateral salpingo-oophorectomy with peritoneal washings.  Surgery and risks reviewed with patient.  Will follow up for preop.  2. Malignant neoplasm of upper-outer quadrant of left breast in female, estrogen receptor positive ([place]) Left breast cancer on anastrozole.  Counseling on above issues and coordination of care more than 50% of 25 minutes.  Princess Bruins MD, 3:25 PM 01/09/2018

## 2018-01-10 ENCOUNTER — Telehealth: Payer: Self-pay

## 2018-01-10 NOTE — Telephone Encounter (Signed)
I called patient because I received surgery order from Dr. Marguerita Merles to schedule surgery asap.  I have tentatively scheduled patient for 01/17/18 at 7:30am to hold the time.  She called me back and said she did not know Dr. Marguerita Merles was scheduling surgery. She said she is returning next Weds. To discuss the possibility of surgery.  She asked about getting her u/s report/pics as she desires a second opinion.  [name] picked up and spoke with her in Spanish to clarify the details. Ultimately, the patient told her to have me cancel the surgery. She needs more time to make arrangements and decide.  Surgery cancelled for now.

## 2018-01-14 ENCOUNTER — Encounter: Payer: Self-pay | Admitting: Obstetrics & Gynecology

## 2018-01-14 NOTE — Patient Instructions (Addendum)
1. Ovarian mass, right Right ovary with a solid mass with positive color flow Doppler.  The mass now measures 1.8 x 1.5 x 20.6 cm which is slightly increased in size compared to the pelvic ultrasound in January 2018.  Ca1 25 still normal at 5 on November 22, 2017.  The possibility of a benign tumor versus a malignant process discussed with patient.  Given the growth of the solid mass and a positive color flow Doppler, in the context of a patient with breast cancer in menopause, decision to proceed with laparoscopic bilateral salpingo-oophorectomy with peritoneal washings.  Surgery and risks reviewed with patient.  Will follow up for preop.  2. Malignant neoplasm of upper-outer quadrant of left breast in female, estrogen receptor positive ([place]) Left breast cancer on anastrozole.  [place], fue un placer conocerle hoy!  Voy a verle de nuevo para la cita preoperativa.   Salpingooferectoma bilateral (Bilateral Salpingo-Oophorectomy) La salpingooferectoma bilateral es la remocin [place] de ambas trompas de Falopio y ambos ovarios. Los ovarios son los rganos pequeos que producen vulos en las mujeres. Las trompas de Falopio son los conductos que transportan los vulos desde los ovarios hasta la matriz (tero). Generalmente, cuando se realiza [place], el tero ya ha sido extirpado. Una salpingooferectoma se puede realizar para Lawyer o para reducir Catering manager de cncer en las mujeres de riesgo elevado. La extirpacin de ambas trompas de Falopio y de ambos ovarios har imposible que pueda quedar embarazada (esterilidad). Tambin iniciar la [place], de modo que ya no tendr United Parcel y tendr sntomas de menopausia como calores, sudoracin nocturna y Ambulance person en el estado de nimo. No afectar su impulso sexual. INFORME A SU MDICO:  Cualquier alergia que tenga.  Todos los [employer] [place], incluyendo vitaminas, hierbas, gotas oftlmicas, cremas y medicamentos de venta  libre.  Problemas previos que usted o los [employer] de su familia hayan tenido con el uso de anestsicos.  Enfermedades de [employer].  Cirugas previas.  Padecimientos mdicos. RIESGOS Y COMPLICACIONES Generalmente es un procedimiento seguro. Sin embargo, Games developer procedimiento, pueden surgir complicaciones. Las complicaciones posibles son:  [employer] rganos circundantes.  Hemorragias.  Infeccin.  Cogulos de [employer] piernas o los pulmones.  Problemas relacionados con la anestesia. ANTES DEL PROCEDIMIENTO  Consulte a su mdico si debe cambiar o suspender los medicamentos que toma habitualmente. Es posible que deba dejar de tomar ciertos medicamentos antes de la [place].  No debe comer ni beber nada durante al menos 8 horas antes de la [place].  Si fuma, no lo haga al [employer] previas a la [place].  Haga arreglos para que alguien lo lleve a su casa despus del procedimiento o de la hospitalizacin. Tambin pdale a alguna persona que lo ayude con sus actividades mientras se recupera. PROCEDIMIENTO   Antes del procedimiento le darn un medicamento para que pueda relajarse (sedante). Le administrarn medicamentos para hacerlo dormir durante el procedimiento (anestesia general). Este medicamento se aplica a travs de una va intravenosa (IV) que se coloca en una vena.  Cuando est dormido, le higienizarn y rasurarn el abdomen. Le insertarn un tubo flexible (catter)en la vejiga.  El [place] podr usar una tcnica laparoscpica o Ardelia Mems ciruga abierta. ? En la tcnica laparoscpica, la ciruga se realiza a travs de dos pequeos cortesincisiones) en el abdomen. Se inserta un tubo delgado que emite luz y que posee una cmara (laparoscopio) en una de las incisiones. A travs de estos tubos  se coloca el instrumental necesario para los procedimientos. ? Podr optarse por una tcnica robtica para realizar una ciruga compleja en un espacio pequeo.  En la tcnica robtica se realizan pequeas incisiones. A travs de las incisiones se insertar una cmara e instrumentos quirrgicos. Los instrumentos quirrgicos se controlarn con la ayuda de un brazo robtico. ? En la tcnica laparoscpica, la ciruga se realiza a travs de una gran incisin en el abdomen.  Al utilizar cualquiera de estas tcnicas, el cirujano extirpa las trompas de Falopio y los ovarios. Los vasos sanguneos se pinzarn y Armed forces operational officer.  El cirujano cerrar la incisin con grapas o puntos de sutura. DESPUS DEL PROCEDIMIENTO  La llevarn al rea de recuperacin donde controlarn su evolucin. Le controlarn la presin arterial y el pulso con frecuencia. Permanecer en la sala de recuperacin hasta que se encuentre estable y se despierte.  Si utilizaron la tcnica laparoscpica, Chief Financial Officer a su casa despus de algunas horas. Despus de la tcnica laparoscpica podr Education officer, environmental en los hombros. Esto es normal y generalmente desaparece en un [place].  Si se [place] la tcnica Scientist, research (life sciences) hospital durante un par [place].  Le darn medicamentos para el dolor si los necesita.  Le quitarn la va IV y el catter antes de darle el alta. Esta informacin no tiene Marine scientist el consejo del mdico. Asegrese de hacerle al mdico cualquier pregunta que tenga. Document Released: 01/10/2010 Document Revised: 10/21/2013 Elsevier Interactive Patient Education  2017 [employer].

## 2018-01-16 ENCOUNTER — Encounter: Payer: Self-pay | Admitting: Obstetrics & Gynecology

## 2018-01-16 ENCOUNTER — Ambulatory Visit: Payer: BLUE CROSS/BLUE SHIELD | Admitting: Obstetrics & Gynecology

## 2018-01-16 VITALS — BP 128/84

## 2018-01-16 DIAGNOSIS — D3911 Neoplasm of uncertain behavior of right ovary: Secondary | ICD-10-CM | POA: Diagnosis not present

## 2018-01-16 DIAGNOSIS — Z170 Estrogen receptor positive status [ER+]: Secondary | ICD-10-CM | POA: Diagnosis not present

## 2018-01-16 DIAGNOSIS — C50412 Malignant neoplasm of upper-outer quadrant of left female breast: Secondary | ICD-10-CM

## 2018-01-16 DIAGNOSIS — N838 Other noninflammatory disorders of ovary, fallopian tube and broad ligament: Secondary | ICD-10-CM

## 2018-01-16 NOTE — Progress Notes (Signed)
    Heather [name] HUSBAND 1956-06-02 [id number]        62 y.o.  [id number]  Patient came accompanied by her husband.  Declines interpreter who I had asked for, as her husband is comfortable in [place].  RP: Right slow growing solid ovarian mass with vascularization  HPI: Patient was seen on 01/09/2018 and decision was made to proceed with LPS BSO, washings, but when patient was called to schedule surgery, she was not ready to schedule and wanted another visit to further discussed and possibly a second opinion.  A referral to Gyneco-On[facility] was recommended, but patient preferred to see me again.  She is S/P Hysterectomy.  Menopausal on no HRT.  With a H/O Left Breast Ca Dxed in 2017, on Anastrazole.  Last visit, Pelvic US were compared between 11/01/2016 and that day 01/09/2018:  The small solid mass in the Rt Ovary went from 1.2 cm to 1.8 cm.  The mass is CFD positive and shows Ca++.  No FF in the PCD.  Ca125 was 3 in 10/2016 and was still normal and stable at 5 this year 10/2017.   Patient is asymptomatic with no pelvic pain.   OB History  Gravida Para Term Preterm AB Living  2 2 2     2   SAB TAB Ectopic Multiple Live Births          2    # Outcome Date GA Lbr Len/2nd Weight Sex Delivery Anes PTL Lv  2 Term     F CS-Unspec  N LIV  1 Term     F Vag-Spont  N LIV      Past medical history,surgical history, problem list, medications, allergies, family history and social history were all reviewed and documented in the EPIC chart.   Directed ROS with pertinent positives and negatives documented in the history of present illness/assessment and plan.  Exam:  Vitals:   01/16/18 1207  BP: 128/84   General appearance:  Normal  Deferred   Assessment/Plan:  62 y.o. [id number]   1. Ovarian mass, right Right ovary showing a slow growing solid ovarian mass with calcifications and positive color flow Doppler.  Ca125 still negative at 5.  Given her history of breast cancer, menopausal status and a right ovarian  solid tumor with uncertainty as to whether it is benign or malignant, the patient was strongly recommended to proceed with laparoscopic bilateral salpingo-oophorectomy with peritoneal washings.  Patient voiced understanding and agreement with the plan.  The surgery was discussed thoroughly including risks and benefits.  The patient had been given additional information about the surgery at the last visit.  Patient agreed with organizing this artery as soon as possible.  Patient was also offered a referral in gynecologic oncology once again to further discuss the management plan with a specialist in female cancer, but she refused.  2. Malignant neoplasm of upper-outer quadrant of left breast in female, estrogen receptor positive ([place]) On Anastrazole.  Counseling on above issues and coordination of care more than 50% of 25 minutes.  Princess Bruins MD, 12:22 PM 01/16/2018

## 2018-01-17 ENCOUNTER — Ambulatory Visit (HOSPITAL_BASED_OUTPATIENT_CLINIC_OR_DEPARTMENT_OTHER): Admit: 2018-01-17 | Payer: BLUE CROSS/BLUE SHIELD | Admitting: Obstetrics & Gynecology

## 2018-01-17 ENCOUNTER — Encounter (HOSPITAL_BASED_OUTPATIENT_CLINIC_OR_DEPARTMENT_OTHER): Payer: Self-pay

## 2018-01-17 ENCOUNTER — Telehealth: Payer: Self-pay

## 2018-01-17 SURGERY — SALPINGO-OOPHORECTOMY, BILATERAL, LAPAROSCOPIC
Anesthesia: General | Laterality: Bilateral

## 2018-01-17 NOTE — Telephone Encounter (Signed)
Heather [name] spoke with patient about scheduling surgery. [name] sent me the following message after speaking with patient "Patient states she needs to talk to her husband in regards to the prepayment. She will call back to inform us if she still wants surgery."

## 2018-01-18 NOTE — Telephone Encounter (Signed)
Please refer to [facility] to discuss management.

## 2018-01-20 ENCOUNTER — Encounter: Payer: Self-pay | Admitting: Obstetrics & Gynecology

## 2018-01-20 NOTE — Patient Instructions (Signed)
1. Ovarian mass, right Right ovary showing a slow growing solid ovarian mass with calcifications and positive color flow Doppler.  Ca125 still negative at 5.  Given her history of breast cancer, menopausal status and a right ovarian solid tumor with uncertainty as to whether it is benign or malignant, the patient was strongly recommended to proceed with laparoscopic bilateral salpingo-oophorectomy with peritoneal washings.  Patient voiced understanding and agreement with the plan.  The surgery was discussed thoroughly including risks and benefits.  The patient had been given additional information about the surgery at the last visit.  Patient agreed with organizing this artery as soon as possible.  Patient was also offered a referral in gynecologic oncology once again to further discuss the management plan with a specialist in female cancer, but she refused.  2. Malignant neoplasm of upper-outer quadrant of left breast in female, estrogen receptor positive ([place]) On Anastrazole.  Heather [name], good seeing you today!

## 2018-01-21 ENCOUNTER — Telehealth: Payer: Self-pay | Admitting: *Deleted

## 2018-01-21 ENCOUNTER — Telehealth: Payer: Self-pay

## 2018-01-21 NOTE — Telephone Encounter (Signed)
Pt scheduled on 02/07/18 @ 11:00am with Dr.Brewster at Gyn-oncology due to right ovarian mass, claudia will you call and relay to patient she will have pelvic exam at this visit and her office is located at [facility] just go to main entrance and check with the front desk. They also have valet parking as well.

## 2018-01-21 NOTE — Telephone Encounter (Signed)
Heather [name] relayed to me that she had conversation with this patient and she is ready to schedule surgery but wants to schedule at end of May.  Surgery scheduled for May 28 8:30am at [facility]. Heather [name] had previously discussed her ins benefits and estimated surgery prepymt due by one week before surgery with her.  I will send a financial letter explaining this as well as a pamphlet from [facility].  I will ask Earnest Bailey to give her a call with her surgery information.

## 2018-01-21 NOTE — Telephone Encounter (Signed)
-----   Message from Ramond Craver, [place] sent at 01/18/2018  2:05 PM EDT ----- Regarding: referral to onc Please refer to [facility] to discuss management.

## 2018-01-21 NOTE — Telephone Encounter (Signed)
Spoke with patient she does not want to see an oncologist she wants to proceed with surgery. I spoke with Juliann Pulse she will call patient with surgery date.

## 2018-01-24 ENCOUNTER — Encounter: Payer: Self-pay | Admitting: Anesthesiology

## 2018-01-24 ENCOUNTER — Telehealth: Payer: Self-pay | Admitting: *Deleted

## 2018-01-24 NOTE — Telephone Encounter (Signed)
Per staff message from [place] at Dr. Assunta Curtis office, patient will have surgery there

## 2018-02-07 ENCOUNTER — Ambulatory Visit: Payer: BLUE CROSS/BLUE SHIELD | Admitting: Gynecologic Oncology

## 2018-02-28 ENCOUNTER — Other Ambulatory Visit: Payer: Self-pay

## 2018-02-28 ENCOUNTER — Encounter (HOSPITAL_BASED_OUTPATIENT_CLINIC_OR_DEPARTMENT_OTHER): Payer: Self-pay | Admitting: *Deleted

## 2018-02-28 NOTE — Progress Notes (Signed)
SPOKE W/ PT VIA PHONE FOR PRE-OP INTERVIEW.  NPO AFTER MN.  ARRIVE AT 0745.  NEEDS CBC.  PT JEHOVAH WITNESS, REFUSE BLOOD PRODUCTS, NO T&S NEEDED.  PT ASKED IF INTERPRETER NEEDED DOS , STATED HUSBAND SPEAKS AND UNDERSTANDS ENGLISH WELL.  PT SPEAKS ENGLISH , WAS ABLE TO DO PRE-OP OVER THE PHONE AND PT VERBALIZED UNDERSTANDING OF INSTRUCTIONS.

## 2018-03-05 ENCOUNTER — Ambulatory Visit (HOSPITAL_BASED_OUTPATIENT_CLINIC_OR_DEPARTMENT_OTHER): Payer: BLUE CROSS/BLUE SHIELD | Admitting: Anesthesiology

## 2018-03-05 ENCOUNTER — Other Ambulatory Visit: Payer: Self-pay

## 2018-03-05 ENCOUNTER — Encounter (HOSPITAL_BASED_OUTPATIENT_CLINIC_OR_DEPARTMENT_OTHER): Payer: Self-pay | Admitting: Certified Registered"

## 2018-03-05 ENCOUNTER — Encounter (HOSPITAL_BASED_OUTPATIENT_CLINIC_OR_DEPARTMENT_OTHER)
Admission: RE | Disposition: A | Discharge status: Home / Self Care | Payer: Self-pay | Source: Ambulatory Visit | Attending: Obstetrics & Gynecology

## 2018-03-05 ENCOUNTER — Ambulatory Visit (HOSPITAL_BASED_OUTPATIENT_CLINIC_OR_DEPARTMENT_OTHER)
Admission: RE | Admit: 2018-03-05 | Discharge: 2018-03-06 | Disposition: A | Discharge status: Home / Self Care | Payer: BLUE CROSS/BLUE SHIELD | Source: Ambulatory Visit | Attending: Obstetrics & Gynecology | Admitting: Obstetrics & Gynecology

## 2018-03-05 DIAGNOSIS — Z90710 Acquired absence of both cervix and uterus: Secondary | ICD-10-CM | POA: Insufficient documentation

## 2018-03-05 DIAGNOSIS — N83201 Unspecified ovarian cyst, right side: Secondary | ICD-10-CM | POA: Diagnosis not present

## 2018-03-05 DIAGNOSIS — N839 Noninflammatory disorder of ovary, fallopian tube and broad ligament, unspecified: Secondary | ICD-10-CM | POA: Diagnosis not present

## 2018-03-05 DIAGNOSIS — Z79811 Long term (current) use of aromatase inhibitors: Secondary | ICD-10-CM | POA: Insufficient documentation

## 2018-03-05 DIAGNOSIS — Z9221 Personal history of antineoplastic chemotherapy: Secondary | ICD-10-CM | POA: Insufficient documentation

## 2018-03-05 DIAGNOSIS — Z8249 Family history of ischemic heart disease and other diseases of the circulatory system: Secondary | ICD-10-CM | POA: Insufficient documentation

## 2018-03-05 DIAGNOSIS — Z853 Personal history of malignant neoplasm of breast: Secondary | ICD-10-CM | POA: Insufficient documentation

## 2018-03-05 DIAGNOSIS — M8580 Other specified disorders of bone density and structure, unspecified site: Secondary | ICD-10-CM | POA: Insufficient documentation

## 2018-03-05 DIAGNOSIS — Z79899 Other long term (current) drug therapy: Secondary | ICD-10-CM | POA: Diagnosis not present

## 2018-03-05 DIAGNOSIS — N83291 Other ovarian cyst, right side: Secondary | ICD-10-CM | POA: Diagnosis not present

## 2018-03-05 DIAGNOSIS — N838 Other noninflammatory disorders of ovary, fallopian tube and broad ligament: Secondary | ICD-10-CM | POA: Diagnosis not present

## 2018-03-05 DIAGNOSIS — N83202 Unspecified ovarian cyst, left side: Secondary | ICD-10-CM | POA: Diagnosis not present

## 2018-03-05 DIAGNOSIS — D3911 Neoplasm of uncertain behavior of right ovary: Secondary | ICD-10-CM | POA: Diagnosis not present

## 2018-03-05 DIAGNOSIS — Z9012 Acquired absence of left breast and nipple: Secondary | ICD-10-CM | POA: Insufficient documentation

## 2018-03-05 DIAGNOSIS — Z923 Personal history of irradiation: Secondary | ICD-10-CM | POA: Insufficient documentation

## 2018-03-05 DIAGNOSIS — N736 Female pelvic peritoneal adhesions (postinfective): Secondary | ICD-10-CM | POA: Diagnosis not present

## 2018-03-05 HISTORY — DX: Malignant neoplasm of upper-outer quadrant of left female breast: Z17.0

## 2018-03-05 HISTORY — DX: Malignant neoplasm of upper-outer quadrant of left female breast: C50.412

## 2018-03-05 HISTORY — DX: Personal history of antineoplastic chemotherapy: Z92.21

## 2018-03-05 HISTORY — PX: LAPAROSCOPIC BILATERAL SALPINGO OOPHERECTOMY: SHX5890

## 2018-03-05 HISTORY — DX: Other noninflammatory disorders of ovary, fallopian tube and broad ligament: N83.8

## 2018-03-05 LAB — CBC
HCT: 40.3 % (ref 36.0–46.0)
Hemoglobin: 13.8 g/dL (ref 12.0–15.0)
MCH: 31.2 pg (ref 26.0–34.0)
MCHC: 34.2 g/dL (ref 30.0–36.0)
MCV: 91.2 fL (ref 78.0–100.0)
Platelets: 189 10*3/uL (ref 150–400)
RBC: 4.42 MIL/uL (ref 3.87–5.11)
RDW: 13.5 % (ref 11.5–15.5)
WBC: 4.3 10*3/uL (ref 4.0–10.5)

## 2018-03-05 SURGERY — SALPINGO-OOPHORECTOMY, BILATERAL, LAPAROSCOPIC
Anesthesia: General | Site: Abdomen | Laterality: Bilateral

## 2018-03-05 MED ORDER — ROCURONIUM BROMIDE 10 MG/ML (PF) SYRINGE
PREFILLED_SYRINGE | INTRAVENOUS | Status: DC | PRN
  Administered 2018-03-05: 50 mg via INTRAVENOUS
  Administered 2018-03-05 (×2): 10 mg via INTRAVENOUS

## 2018-03-05 MED ORDER — SUGAMMADEX SODIUM 200 MG/2ML IV SOLN
INTRAVENOUS | Status: AC
  Filled 2018-03-05: qty 2

## 2018-03-05 MED ORDER — KETOROLAC TROMETHAMINE 30 MG/ML IJ SOLN
INTRAMUSCULAR | Status: AC
  Filled 2018-03-05: qty 1

## 2018-03-05 MED ORDER — BUPIVACAINE HCL (PF) 0.25 % IJ SOLN
INTRAMUSCULAR | Status: DC | PRN
  Administered 2018-03-05: 17 mL

## 2018-03-05 MED ORDER — SUGAMMADEX SODIUM 200 MG/2ML IV SOLN
INTRAVENOUS | Status: DC | PRN
  Administered 2018-03-05: 120 mg via INTRAVENOUS

## 2018-03-05 MED ORDER — HEMOSTATIC AGENTS (NO CHARGE) OPTIME
TOPICAL | Status: DC | PRN
  Administered 2018-03-05: 1 via TOPICAL

## 2018-03-05 MED ORDER — FENTANYL CITRATE (PF) 100 MCG/2ML IJ SOLN
25.0000 ug | INTRAMUSCULAR | Status: DC | PRN
  Filled 2018-03-05: qty 1

## 2018-03-05 MED ORDER — OXYCODONE HCL 5 MG PO TABS
5.0000 mg | ORAL_TABLET | Freq: Once | ORAL | Status: AC
  Administered 2018-03-05: 5 mg via ORAL
  Filled 2018-03-05: qty 1

## 2018-03-05 MED ORDER — LACTATED RINGERS IV SOLN
INTRAVENOUS | Status: DC
  Administered 2018-03-05 (×3): via INTRAVENOUS
  Filled 2018-03-05 (×2): qty 1000

## 2018-03-05 MED ORDER — FENTANYL CITRATE (PF) 100 MCG/2ML IJ SOLN
INTRAMUSCULAR | Status: AC
  Filled 2018-03-05: qty 2

## 2018-03-05 MED ORDER — MIDAZOLAM HCL 2 MG/2ML IJ SOLN
INTRAMUSCULAR | Status: DC | PRN
  Administered 2018-03-05: 2 mg via INTRAVENOUS

## 2018-03-05 MED ORDER — SODIUM CHLORIDE 0.9 % IR SOLN
Status: DC | PRN
  Administered 2018-03-05: 2000 mL

## 2018-03-05 MED ORDER — ONDANSETRON HCL 4 MG/2ML IJ SOLN
INTRAMUSCULAR | Status: AC
  Filled 2018-03-05: qty 2

## 2018-03-05 MED ORDER — ROCURONIUM BROMIDE 10 MG/ML (PF) SYRINGE
PREFILLED_SYRINGE | INTRAVENOUS | Status: AC
  Filled 2018-03-05: qty 5

## 2018-03-05 MED ORDER — KETOROLAC TROMETHAMINE 30 MG/ML IJ SOLN
INTRAMUSCULAR | Status: DC | PRN
  Administered 2018-03-05: 30 mg via INTRAVENOUS

## 2018-03-05 MED ORDER — LIDOCAINE 2% (20 MG/ML) 5 ML SYRINGE
INTRAMUSCULAR | Status: AC
  Filled 2018-03-05: qty 5

## 2018-03-05 MED ORDER — HYDROMORPHONE HCL 1 MG/ML IJ SOLN
0.2500 mg | INTRAMUSCULAR | Status: DC | PRN
  Filled 2018-03-05: qty 0.5

## 2018-03-05 MED ORDER — CEFAZOLIN SODIUM-DEXTROSE 2-4 GM/100ML-% IV SOLN
2.0000 g | INTRAVENOUS | Status: AC
  Administered 2018-03-05: 2 g via INTRAVENOUS
  Filled 2018-03-05: qty 100

## 2018-03-05 MED ORDER — EPHEDRINE SULFATE-NACL 50-0.9 MG/10ML-% IV SOSY
PREFILLED_SYRINGE | INTRAVENOUS | Status: DC | PRN
  Administered 2018-03-05 (×2): 10 mg via INTRAVENOUS

## 2018-03-05 MED ORDER — ONDANSETRON HCL 4 MG/2ML IJ SOLN
INTRAMUSCULAR | Status: DC | PRN
  Administered 2018-03-05: 4 mg via INTRAVENOUS

## 2018-03-05 MED ORDER — PROPOFOL 10 MG/ML IV BOLUS
INTRAVENOUS | Status: DC | PRN
  Administered 2018-03-05: 170 mg via INTRAVENOUS

## 2018-03-05 MED ORDER — LACTATED RINGERS IV SOLN
INTRAVENOUS | Status: DC
  Filled 2018-03-05: qty 1000

## 2018-03-05 MED ORDER — PROPOFOL 10 MG/ML IV BOLUS
INTRAVENOUS | Status: AC
  Filled 2018-03-05: qty 20

## 2018-03-05 MED ORDER — MIDAZOLAM HCL 2 MG/2ML IJ SOLN
INTRAMUSCULAR | Status: AC
  Filled 2018-03-05: qty 2

## 2018-03-05 MED ORDER — DEXAMETHASONE SODIUM PHOSPHATE 10 MG/ML IJ SOLN
INTRAMUSCULAR | Status: DC | PRN
  Administered 2018-03-05: 10 mg via INTRAVENOUS

## 2018-03-05 MED ORDER — OXYCODONE HCL 5 MG PO TABS
ORAL_TABLET | ORAL | Status: AC
  Filled 2018-03-05: qty 1

## 2018-03-05 MED ORDER — FENTANYL CITRATE (PF) 100 MCG/2ML IJ SOLN
INTRAMUSCULAR | Status: DC | PRN
  Administered 2018-03-05: 25 ug via INTRAVENOUS
  Administered 2018-03-05: 50 ug via INTRAVENOUS
  Administered 2018-03-05 (×5): 25 ug via INTRAVENOUS

## 2018-03-05 MED ORDER — LIDOCAINE 2% (20 MG/ML) 5 ML SYRINGE
INTRAMUSCULAR | Status: DC | PRN
  Administered 2018-03-05: 60 mg via INTRAVENOUS

## 2018-03-05 MED ORDER — DEXAMETHASONE SODIUM PHOSPHATE 10 MG/ML IJ SOLN
INTRAMUSCULAR | Status: AC
Start: 2018-03-05 — End: ?
  Filled 2018-03-05: qty 1

## 2018-03-05 MED ORDER — CEFAZOLIN SODIUM-DEXTROSE 2-4 GM/100ML-% IV SOLN
INTRAVENOUS | Status: AC
  Filled 2018-03-05: qty 100

## 2018-03-05 MED ORDER — OXYCODONE-ACETAMINOPHEN 7.5-325 MG PO TABS: 1.0000 | ORAL_TABLET | ORAL | 0 refills | Status: DC | PRN

## 2018-03-05 SURGICAL SUPPLY — 45 items
APPLICATOR ARISTA FLEXITIP XL (MISCELLANEOUS) ×2 IMPLANT
CABLE HIGH FREQUENCY MONO STRZ (ELECTRODE) ×2 IMPLANT
CATH ROBINSON RED A/P 16FR (CATHETERS) ×2 IMPLANT
DERMABOND ADVANCED (GAUZE/BANDAGES/DRESSINGS) ×1
DERMABOND ADVANCED .7 DNX12 (GAUZE/BANDAGES/DRESSINGS) ×1 IMPLANT
DRSG COVADERM PLUS 2X2 (GAUZE/BANDAGES/DRESSINGS) ×2 IMPLANT
DRSG OPSITE POSTOP 3X4 (GAUZE/BANDAGES/DRESSINGS) ×2 IMPLANT
DURAPREP 26ML APPLICATOR (WOUND CARE) ×2 IMPLANT
GLOVE BIO SURGEON STRL SZ 6.5 (GLOVE) ×6 IMPLANT
GLOVE BIO SURGEON STRL SZ7.5 (GLOVE) IMPLANT
GLOVE BIOGEL PI IND STRL 6.5 (GLOVE) ×3 IMPLANT
GLOVE BIOGEL PI IND STRL 7.0 (GLOVE) ×2 IMPLANT
GLOVE BIOGEL PI IND STRL 7.5 (GLOVE) ×2 IMPLANT
GLOVE BIOGEL PI INDICATOR 6.5 (GLOVE) ×3
GLOVE BIOGEL PI INDICATOR 7.0 (GLOVE) ×2
GLOVE BIOGEL PI INDICATOR 7.5 (GLOVE) ×2
GOWN STRL REUS W/TWL LRG LVL3 (GOWN DISPOSABLE) ×4 IMPLANT
HEMOSTAT ARISTA ABSORB 3G PWDR (MISCELLANEOUS) ×2 IMPLANT
IV NS 1000ML (IV SOLUTION) ×2
IV NS 1000ML BAXH (IV SOLUTION) ×2 IMPLANT
IV SET WARMING FLUID PORT VENT (MISCELLANEOUS) ×2 IMPLANT
MANIPULATOR UTERINE 4.5 ZUMI (MISCELLANEOUS) IMPLANT
NEEDLE INSUFFLATION 14GA 120MM (NEEDLE) ×2 IMPLANT
NS IRRIG 500ML POUR BTL (IV SOLUTION) ×2 IMPLANT
PACK LAPAROSCOPY BASIN (CUSTOM PROCEDURE TRAY) ×2 IMPLANT
PACK TRENDGUARD 450 HYBRID PRO (MISCELLANEOUS) IMPLANT
PAD OB MATERNITY 4.3X12.25 (PERSONAL CARE ITEMS) ×2 IMPLANT
PROTECTOR NERVE ULNAR (MISCELLANEOUS) ×4 IMPLANT
SET IRRIG TUBING LAPAROSCOPIC (IRRIGATION / IRRIGATOR) ×2 IMPLANT
SHEARS HARMONIC ACE PLUS 36CM (ENDOMECHANICALS) ×4 IMPLANT
SLEEVE XCEL OPT CAN 5 100 (ENDOMECHANICALS) IMPLANT
SUT MNCRL AB 4-0 PS2 18 (SUTURE) ×2 IMPLANT
SUT VICRYL 0 UR6 27IN ABS (SUTURE) ×2 IMPLANT
SYS BAG RETRIEVAL 10MM (BASKET) ×2
SYSTEM BAG RETRIEVAL 10MM (BASKET) ×1 IMPLANT
TOWEL OR 17X24 6PK STRL BLUE (TOWEL DISPOSABLE) ×4 IMPLANT
TRAY FOLEY CATH SILVER 14FR (SET/KITS/TRAYS/PACK) ×2 IMPLANT
TRAY FOLEY W/BAG SLVR 14FR (SET/KITS/TRAYS/PACK) ×2 IMPLANT
TRENDGUARD 450 HYBRID PRO PACK (MISCELLANEOUS)
TROCAR BALLN 12MMX100 BLUNT (TROCAR) ×2 IMPLANT
TROCAR BLADELESS OPT 5 100 (ENDOMECHANICALS) ×2 IMPLANT
TROCAR XCEL DIL TIP R 11M (ENDOMECHANICALS) ×2 IMPLANT
TROCAR XCEL NON-BLD 11X100MML (ENDOMECHANICALS) IMPLANT
TROCAR XCEL NON-BLD 5MMX100MML (ENDOMECHANICALS) ×2 IMPLANT
WARMER LAPAROSCOPE (MISCELLANEOUS) ×2 IMPLANT

## 2018-03-05 NOTE — Discharge Instructions (Addendum)
Bilateral Salpingo-Oophorectomy, Care After This sheet gives you information about how to care for yourself after your procedure. Your health care provider may also give you more specific instructions. If you have problems or questions, contact your health care provider. What can I expect after the procedure? After the procedure, it is common to have:  Abdominal pain.  Some occasional vaginal bleeding (spotting).  Tiredness.  Symptoms of menopause, such as hot flashes, night sweats, or mood swings.  Follow these instructions at home: Incision care  Keep your incision area and your bandage (dressing) clean and dry.  Follow instructions from your health care provider about how to take care of your incision. Make sure you: ? Wash your hands with soap and water before you change your dressing. If soap and water are not available, use hand sanitizer. ? Change your dressing as told by your health care provider. ? Leave stitches (sutures), staples, skin glue, or adhesive strips in place. These skin closures may need to stay in place for 2 weeks or longer. If adhesive strip edges start to loosen and curl up, you may trim the loose edges. Do not remove adhesive strips completely unless your health care provider tells you to do that.  Check your incision area every day for signs of infection. Check for: ? Redness, swelling, or pain. ? Fluid or blood. ? Warmth. ? Pus or a bad smell. Activity  Do not drive or use heavy machinery while taking prescription pain medicine.  Do not drive for 24 hours if you received a medicine to help you relax (sedative) during your procedure.  Take frequent, short walks throughout the day. Rest when you get tired. Ask your health care provider what activities are safe for you.  Avoid activity that requires great effort. Also, avoid heavy lifting. Do not lift anything that is heavier than 10 lbs. (4.5 kg), or the limit that your health care provider tells you,  until he or she says that it is safe to do so.  Do not douche, use tampons, or have sex until your health care provider approves. General instructions  To prevent or treat constipation while you are taking prescription pain medicine, your health care provider may recommend that you: ? Drink enough fluid to keep your urine clear or pale yellow. ? Take over-the-counter or prescription medicines. ? Eat foods that are high in fiber, such as fresh fruits and vegetables, whole grains, and beans. ? Limit foods that are high in fat and processed sugars, such as fried and sweet foods.  Take over-the-counter and prescription medicines only as told by your health care provider.  Do not take baths, swim, or use a hot tub until your health care provider approves. Ask your health care provider if you can take showers. You may only be allowed to take sponge baths for bathing.  Wear compression stockings as told by your health care provider. These stockings help to prevent blood clots and reduce swelling in your legs.  Keep all follow-up visits as told by your health care provider. This is important. Contact a health care provider if:  You have pain when you urinate.  You have pus or a bad smelling discharge coming from your vagina.  You have redness, swelling, or pain around your incision.  You have fluid or blood coming from your incision.  Your incision feels warm to the touch.  You have pus or a bad smell coming from your incision.  You have a fever.  Your incision  starts to break open.  You have pain in the abdomen, and it gets worse or does not get better when you take medicine.  You develop a rash.  You develop nausea and vomiting.  You feel lightheaded. Get help right away if:  You develop pain in your chest or leg.  You become short of breath.  You faint.  You have increased bleeding from your vagina. Summary  After the procedure, it is common to have pain, bleeding in  the vagina, and symptoms of menopause.  Follow instructions from your health care provider about how to take care of your incision.  Follow instructions from your health care provider about activities and restrictions.  Check your incision every day for signs of infection and report any symptoms to your health care provider. This information is not intended to replace advice given to you by your health care provider. Make sure you discuss any questions you have with your health care provider. Document Released: 10/16/2005 Document Revised: 11/20/2016 Document Reviewed: 11/20/2016 Elsevier Interactive Patient Education  2018 [place] bilateral - Cuidados posteriores (Bilateral Salpingo-Oophorectomy, Care After) Siga estas instrucciones durante las prximas semanas. Estas indicaciones le proporcionan informacin general acerca de cmo deber cuidarse despus del procedimiento. El mdico tambin podr darle instrucciones ms especficas. El tratamiento se ha planificado de acuerdo a las prcticas mdicas actuales, pero a veces se producen problemas. Comunquese con el mdico si tiene algn problema o tiene dudas despus del procedimiento. QU ESPERAR DESPUS DEL PROCEDIMIENTO Despus del procedimiento, es tpico tener las siguientes sensaciones:   Dolor abdominal que puede controlarse con medicamentos.  Prdida o hemorragia vaginal.  Estreimiento.  Sntomas menopusicos como calores, sequedad vaginal y cambios de humor. INSTRUCCIONES PARA EL CUIDADO EN EL HOGAR   Descanse y duerma lo suficiente.  Tome slo medicamentos de venta libre o recetados, segn las indicaciones del mdico. No tome aspirina. Puede ocasionar hemorragias.  [place] y secas. Retire o [employer] apsitos (vendajes) slo como le indic su mdico.  Tome slo duchas y no baos, durante algunas semanas, segn las indicaciones de su mdico.  Limite las  actividades segn las indicaciones del mdico. No levante ningn objeto que sea ms pesado que 5 libras (2.3 kg) hasta que el mdico la autorice.  No conduzca vehculos hasta que el mdico lo autorice.  Siga las indicaciones de su mdico con respecto al tratamiento para el asma antes de hacer actividad fsica. Puede retomar su dieta habitual inmediatamente.  Beba suficiente lquido para Consulting civil engineer orina clara o de color amarillo plido.  No se haga duchas vaginales ni tenga relaciones sexuales durante las 6 semanas siguientes a la [place].  No beba alcohol hasta que el mdico la autorice.  Tmese la [employer] veces por da y Chartered certified accountant.  Si est constipada podr:  ? Consultar a su mdico si puede tomar un laxante suave. ? Agregar frutas y salvado a su dieta. ? Beber ms lquidos.  Concurra a las consultas de control con su mdico segn las indicaciones. SOLICITE ATENCIN MDICA SI:   La zona de la incisin est roja, se hincha o Engineer, water.  Tiene pus en el sitio de la incisin.  Advierte un olor ftido que proviene de la herida o del vendaje.  Tiene dolor, enrojecimiento o hinchazn en la zona en la que fue colocada la va intravenosa.  La incisin se ha abierto (los bordes no se mantienen juntos).  Se siente mareada o sufre  un desmayo.  Siente dolor o tiene una hemorragia al [employer].  Tiene diarrea.  Presenta nuseas o vmitos.  [place] [employer] hemorragia vaginal anormal.  Le aparece una erupcin cutnea.  Aumenta el dolor y no puede controlarlo con Conservation officer, nature. SOLICITE ATENCIN MDICA DE INMEDIATO SI:   Tiene fiebre.  Siente dolor abdominal.  Siente dolor en el pecho.  Le falta el aire.  Se desmaya.  Siente dolor, u observa hinchazn o enrojecimiento en la pierna.  Tiene una hemorragia vaginal abundante, con o sin cogulos. Esta informacin no tiene Marine scientist el consejo del mdico. Asegrese de hacerle al mdico cualquier pregunta  que tenga. Document Released: 01/10/2010 Document Revised: 06/18/2013 Elsevier Interactive Patient Education  2017 [place] Instructions  Activity: Get plenty of rest for the remainder of the day. A responsible individual must stay with you for 24 hours following the procedure.  For the next 24 hours, DO NOT: -Drive a car -Paediatric nurse -Drink alcoholic beverages -Take any medication unless instructed by your physician -Make any legal decisions or sign important papers.  Meals: Start with liquid foods such as gelatin or soup. Progress to regular foods as tolerated. Avoid greasy, spicy, heavy foods. If nausea and/or vomiting occur, drink only clear liquids until the nausea and/or vomiting subsides. Call your physician if vomiting continues.  Special Instructions/Symptoms: Your throat may feel dry or sore from the anesthesia or the breathing tube placed in your throat during surgery. If this causes discomfort, gargle with warm salt water. The discomfort should disappear within 24 hours.  May take Advil, Motrin, Ibuprofen, or Aleve at 6:30 PM.

## 2018-03-05 NOTE — Transfer of Care (Signed)
Immediate Anesthesia Transfer of Care Note  Patient: OKTOBER GLAZER  Procedure(s) Performed: Procedure(s) (LRB): LAPAROSCOPIC BILATERAL SALPINGO OOPHORECTOMY, peritoneal washings, extensive lysis of adhesions (Bilateral)  Patient Location: PACU  Anesthesia Type: General  Level of Consciousness: awake, oriented, sedated and patient cooperative  Airway & Oxygen Therapy: Patient Spontanous Breathing and Patient connected to face mask oxygen  Post-op Assessment: Report given to PACU RN and Post -op Vital signs reviewed and stable  Post vital signs: Reviewed and stable  Complications: No apparent anesthesia complications  Last Vitals:  Vitals Value Taken Time  BP 114/78 03/05/2018 12:48 PM  Temp    Pulse 66 03/05/2018 12:57 PM  Resp 15 03/05/2018 12:57 PM  SpO2 94 % 03/05/2018 12:57 PM  Vitals shown include unvalidated device data.  Last Pain:  Vitals:   03/05/18 1248  TempSrc:   PainSc: Asleep      Patients Stated Pain Goal: 6 (03/05/18 0830)

## 2018-03-05 NOTE — Anesthesia Postprocedure Evaluation (Signed)
Anesthesia Post Note  Patient: Heather [name]  Procedure(s) Performed: LAPAROSCOPIC BILATERAL SALPINGO OOPHORECTOMY, peritoneal washings, extensive lysis of adhesions (Bilateral Abdomen)     Patient location during evaluation: PACU Anesthesia Type: General Level of consciousness: awake Pain management: pain level controlled Vital Signs Assessment: post-procedure vital signs reviewed and stable Respiratory status: spontaneous breathing Cardiovascular status: stable Anesthetic complications: no    Last Vitals:  Vitals:   03/05/18 0750  BP: 140/85  Pulse: 71  Resp: 16  Temp: 36.5 C  SpO2: 100%    Last Pain:  Vitals:   03/05/18 0750  TempSrc: Oral                 Ayvion Kavanagh

## 2018-03-05 NOTE — Anesthesia Procedure Notes (Signed)
Procedure Name: Intubation Date/Time: 03/05/2018 10:14 AM Performed by: Suan Halter, CRNA Pre-anesthesia Checklist: Patient identified, Emergency Drugs available, Suction available and Patient being monitored Patient Re-evaluated:Patient Re-evaluated prior to induction Oxygen Delivery Method: Circle system utilized Preoxygenation: Pre-oxygenation with 100% oxygen Induction Type: IV induction Ventilation: Mask ventilation without difficulty Laryngoscope Size: Mac and 3 Grade View: Grade I Tube type: Oral Tube size: 7.0 mm Number of attempts: 1 Airway Equipment and Method: Stylet and Oral airway Placement Confirmation: ETT inserted through vocal cords under direct vision,  positive ETCO2 and breath sounds checked- equal and bilateral Tube secured with: Tape Dental Injury: Teeth and Oropharynx as per pre-operative assessment

## 2018-03-05 NOTE — H&P (Signed)
Heather [name] is an 62 y.o. female. [id number]   RP: Right slow growing solid ovarian mass with vascularization for LPS BSO, peritoneal washings  HPI: S/P Hysterectomy.  Menopausal on no HRT.  With a H/O Left Breast Ca Dxed in 2017, on Anastrazole.  Last visit, Pelvic US were compared between 11/01/2016 and that day 01/09/2018: The small solid mass in the Rt Ovary went from 1.2 cm to 1.8 cm.  The mass is CFD positive and shows Ca++.  No FF in the PCD.  Ca125 was 3 in 10/2016 and was still normal and stable at 5 this year 10/2017.   Patient is asymptomatic with no pelvic pain.   Pertinent Gynecological History: Blood transfusions: none Sexually transmitted diseases: no past history Previous GYN Procedures: Total Hysterectomy  Last mammogram: normal  Last pap: normal    Menstrual History: No LMP recorded. Patient has had a hysterectomy.    Past Medical History:  Diagnosis Date  . History of cancer chemotherapy 12-29-2015 to 05-10-2016  . History of radiation therapy 06/14/16 - 07/26/16   Left Breast  . Malignant neoplasm of upper-outer quadrant of left breast in female, estrogen receptor positive [facility]) oncologist-  dr Lindi Adie--- no recurrence   dx 11-03-2015-- left breast cancer DCIS, grade 2,  ER+---  11-22-2015 s/p  left parital mastectomy w/ node dissection;  completed chemo 05-10-2016, completed radiation 07-25-2016  . Mass of right ovary   . Osteopenia 11/2017   T score -2.3 FRAX 7.4% / 0.5%    Past Surgical History:  Procedure Laterality Date  . ABDOMINAL HYSTERECTOMY  2001  approx.     (FIBROIDS)  . CESAREAN SECTION  yrs ago  . PORT-A-CATH REMOVAL Right 05/25/2016   Procedure: REMOVAL PORT-A-CATH;  Surgeon: Autumn Messing III, MD;  Location: [place];  Service: General;  Laterality: Right;  . PORTACATH PLACEMENT Right 12/27/2015   Procedure: INSERTION PORT-A-CATH;  Surgeon: Autumn Messing III, MD;  Location: [place];  Service: General;  Laterality: Right;  . RADIOACTIVE SEED  GUIDED PARTIAL MASTECTOMY WITH AXILLARY SENTINEL LYMPH NODE BIOPSY Left 11/22/2015   Procedure: RADIOACTIVE SEED GUIDED PARTIAL MASTECTOMY WITH AXILLARY SENTINEL LYMPH NODE BIOPSY;  Surgeon: Autumn Messing III, MD;  Location: [place];  Service: General;  Laterality: Left;    Family History  Problem Relation Age of Onset  . Ulcers Mother   . Cirrhosis Father   . Diabetes Brother   . Hypertension Brother   . Hypertension Sister     Social History:  reports that she has never smoked. She has never used smokeless tobacco. She reports that she drinks alcohol. She reports that she does not use drugs.  Allergies: No Known Allergies  Medications Prior to Admission  Medication Sig Dispense Refill Last Dose  . anastrozole (ARIMIDEX) 1 MG tablet TAKE 1 TABLET(1 MG) BY MOUTH DAILY (Patient taking differently: Take 1 mg by mouth every evening. TAKE 1 TABLET(1 MG) BY MOUTH DAILY) 90 tablet 3 03/04/2018 at Unknown time  . b complex vitamins tablet Take 1 tablet by mouth daily.   Past Month at Unknown time  . calcium carbonate (OS-CAL) 600 MG TABS Take 600 mg by mouth 2 (two) times daily with a meal.   Past Month at Unknown time  . cholecalciferol (VITAMIN D) 1000 UNITS tablet Take 1,000 Units by mouth daily.   03/04/2018 at Unknown time  . Misc Natural Products (GLUCOSAMINE CHONDROITIN ADV PO) Take by mouth daily.   03/04/2018 at Unknown time  .  Omega-3 Fatty Acids (OMEGA-3 FISH OIL) 500 MG CAPS Take 1 capsule by mouth daily.   03/04/2018 at Unknown time  . VITAMIN E COMPLEX PO Take by mouth daily.    03/04/2018 at Unknown time  . Ascorbic Acid (VITAMIN C) 1000 MG tablet Take 1,000 mg by mouth daily.   More than a month at Unknown time    REVIEW OF SYSTEMS: A ROS was performed and pertinent positives and negatives are included in the history.  GENERAL: No fevers or chills. HEENT: No change in vision, no earache, sore throat or sinus congestion. NECK: No pain or stiffness. CARDIOVASCULAR: No chest  pain or pressure. No palpitations. PULMONARY: No shortness of breath, cough or wheeze. GASTROINTESTINAL: No abdominal pain, nausea, vomiting or diarrhea, melena or bright red blood per rectum. GENITOURINARY: No urinary frequency, urgency, hesitancy or dysuria. MUSCULOSKELETAL: No joint or muscle pain, no back pain, no recent trauma. DERMATOLOGIC: No rash, no itching, no lesions. ENDOCRINE: No polyuria, polydipsia, no heat or cold intolerance. No recent change in weight. HEMATOLOGICAL: No anemia or easy bruising or bleeding. NEUROLOGIC: No headache, seizures, numbness, tingling or weakness. PSYCHIATRIC: No depression, no loss of interest in normal activity or change in sleep pattern.     Blood pressure 140/85, pulse 71, temperature 97.7 F (36.5 C), temperature source Oral, resp. rate 16, height 5' (1.524 m), weight 129 lb 1.6 oz (58.6 kg), SpO2 100 %.  Physical Exam:  See office notes   Results for orders placed or performed during the hospital encounter of 03/05/18 (from the past 24 hour(s))  CBC     Status: None   Collection Time: 03/05/18  8:10 AM  Result Value Ref Range   WBC 4.3 4.0 - 10.5 K/uL   RBC 4.42 3.87 - 5.11 MIL/uL   Hemoglobin 13.8 12.0 - 15.0 g/dL   HCT 40.3 36.0 - 46.0 %   MCV 91.2 78.0 - 100.0 fL   MCH 31.2 26.0 - 34.0 pg   MCHC 34.2 30.0 - 36.0 g/dL   RDW 13.5 11.5 - 15.5 %   Platelets 189 150 - 400 K/uL    Assessment/Plan:  62 y.o. G2P2002   1. Ovarian mass, right Right ovary showing a slow growing solid ovarian mass with calcifications and positive color flow Doppler.  Ca125 still negative at 5.  Given her history of breast cancer, menopausal status and a right ovarian solid tumor with uncertainty as to whether it is benign or malignant, the patient was strongly recommended to proceed with laparoscopic bilateral salpingo-oophorectomy with peritoneal washings.  Patient voiced understanding and agreement with the plan.  The surgery was discussed thoroughly including  risks and benefits.  Patient was also offered a referral in gynecologic oncology once again to further discuss the management plan with a specialist in female cancer, but she refused.  2. Malignant neoplasm of upper-outer quadrant of left breast in female, estrogen receptor positive ([place]) On Anastrazole.                        Patient was counseled as to the risk of surgery to include the following:  1. Infection (prohylactic antibiotics will be administered)  2. DVT/Pulmonary Embolism (prophylactic pneumo compression stockings will be used)  3.Trauma to internal organs requiring additional surgical procedure to repair any injury to internal organs requiring perhaps additional hospitalization days.  4.Hemmorhage requiring transfusion and blood products which carry risks such as anaphylactic reaction, hepatitis and AIDS  Patient had received literature  information on the procedure scheduled and all her questions were answered and fully accepts all risk.   Marie-Lyne Kitti Mcclish 03/05/2018, 9:42 AM

## 2018-03-05 NOTE — Op Note (Signed)
Operative Note  03/05/2018  12:53 PM  PATIENT:  Heather [name]  62 y.o. female  PRE-OPERATIVE DIAGNOSIS:  right ovarian solid nodule, history of breast cancer  POST-OPERATIVE DIAGNOSIS:  right ovarian solid nodule, history of breast cancer, severe pelvic adhesions  PROCEDURE:  Procedure(s): LAPAROSCOPIC BILATERAL SALPINGO OOPHORECTOMY, peritoneal washings, extensive lysis of adhesions  SURGEON:  Surgeon(s): Princess Bruins, MD Fontaine, Belinda Block, MD  ANESTHESIA:   general  FINDINGS: Severe pelvic adhesions of intestines to the right and left pelvic walls as well as with the bladder and bilateral ovaries and tubes.  Ovaries and tubes adherent to the pelvic walls.  DESCRIPTION OF OPERATION: Under general anesthesia with endotracheal intubation the patient is in lithotomy position.  She is prepped with DuraPrep on the abdomen and with Betadine on the suprapubic, vulvar and vaginal areas.  The bladder was catheterized.  The patient is status post total hysterectomy, a sponge on a stick will therefore be used vaginally.  Timeout is done.      Abdominal, we infiltrate the infraumbilical area with Marcaine one quarter plain.  We make a 1.5 cm incision with a scalpel at that level.  While lifting the abdominal wall we insert the Optiview 12 millimeter port with the camera inside.  We created pneumoperitoneum with CO2.  Inspection of the abdominal cavity reveals no abnormality.  Inspection of the pelvic cavity reveals severe adhesions of the bowels with the right and left pelvic wall as well as with the bladder and both ovaries and both tubes.  We make 5 mm incisions after Marcaine infiltration on either side of the lower abdomen and we inserted a 5 mm ports on both sides under direct vision.  Given the severity of the adhesions a fourth port is inserted on the left upper side   After infiltration of Marcaine and a small incision with a scalpel.  That port is inserted under direct vision as well.  We  also put a Foley in the bladder.  Peritoneal washings are done.  We start on the left side with meticulous lysis of adhesions to expose the left tube and ovary.  Once the left infundibulopelvic ligament is released, we are able to identify the left ureter in normal anatomic position, and we can cauterized and section the left and the infundibulopelvic ligament with the harmonic scalpel.  We then complete the lysis of adhesion freeing the left tube and ovary.  The specimen is removed from the abdominal pelvic cavity through the 12 mm port.  We then go on the right side and we proceeded with meticulous lysis of addition as well.  We are able to find the plane between the ovary and the ovarian fossa.  We continue lysis of adhesion until the right infundibulopelvic ligament is isolated.  We cauterized and sectioned the right infundibulopelvic ligament with the harmonic scalpel.  We then followed just under the ovary and tube cauterizing and sectioning.  We were then able to completely free the right tube and ovary.  The specimen was put in the Endobag and removed from the abdominal pelvic cavity and sent to pathology with the other specimen.  Both ureters were seen in normal position with good peristalsis.  The urine was clear.  Hemostasis was adequate at all levels.  We irrigated and suctioned the abdominal pelvic cavities.  Arista was sprayed bilaterally on the pelvic walls where the lysis of adhesions was done.  All laparoscopic instruments were removed.  The ports were removed under direct vision.  The CO2 was evacuated.  A separate stitch was put on the adipose tissue at the infraumbilical incision with a Monocryl 4/0.  The infraumbilical incision was closed at the skin with a subcuticular suture of Monocryl 4/0.  The other 5 mm incisions were closed with separate stitches of Monocryl 4/0.  No complication occurred and the patient was brought to recovery room in good and stable status.  An extra 2-hour of time  was spent for the extensive lysis of adhesion.  ESTIMATED BLOOD LOSS: 25 mL   Intake/Output Summary (Last 24 hours) at 03/05/2018 1253 Last data filed at 03/05/2018 1233 Gross per 24 hour  Intake 1000 ml  Output 25 ml  Net 975 ml     BLOOD ADMINISTERED:none   LOCAL MEDICATIONS USED:  MARCAINE     SPECIMEN:  Source of Specimen:  Bilateral tubes and ovaries, peritoneal washings.  DISPOSITION OF SPECIMEN:  PATHOLOGY  COUNTS:  YES  PLAN OF CARE: Transfer to PACU  Marie-Lyne LavoieMD12:53 PM

## 2018-03-05 NOTE — Anesthesia Preprocedure Evaluation (Signed)
Anesthesia Evaluation  Patient identified by MRN, date of birth, ID band Patient awake    Reviewed: Allergy & Precautions, NPO status , Patient's Chart, lab work & pertinent test results  Airway Mallampati: II  TM Distance: >3 FB     Dental   Pulmonary neg pulmonary ROS,    breath sounds clear to auscultation       Cardiovascular negative cardio ROS   Rhythm:Regular Rate:Normal     Neuro/Psych    GI/Hepatic negative GI ROS, Neg liver ROS,   Endo/Other  negative endocrine ROS  Renal/GU negative Renal ROS     Musculoskeletal   Abdominal   Peds  Hematology   Anesthesia Other Findings   Reproductive/Obstetrics                             Anesthesia Physical Anesthesia Plan  ASA: II  Anesthesia Plan: General   Post-op Pain Management:    Induction: Intravenous  PONV Risk Score and Plan: 3 and Treatment may vary due to age or medical condition, Ondansetron, Dexamethasone and Midazolam  Airway Management Planned: Oral ETT  Additional Equipment:   Intra-op Plan:   Post-operative Plan: Extubation in OR  Informed Consent: I have reviewed the patients History and Physical, chart, labs and discussed the procedure including the risks, benefits and alternatives for the proposed anesthesia with the patient or authorized representative who has indicated his/her understanding and acceptance.     Plan Discussed with: CRNA and Anesthesiologist  Anesthesia Plan Comments:         Anesthesia Quick Evaluation

## 2018-03-06 ENCOUNTER — Encounter (HOSPITAL_BASED_OUTPATIENT_CLINIC_OR_DEPARTMENT_OTHER): Payer: Self-pay | Admitting: Obstetrics & Gynecology

## 2018-03-27 ENCOUNTER — Encounter: Payer: Self-pay | Admitting: Obstetrics & Gynecology

## 2018-03-27 ENCOUNTER — Ambulatory Visit (INDEPENDENT_AMBULATORY_CARE_PROVIDER_SITE_OTHER): Payer: BLUE CROSS/BLUE SHIELD | Admitting: Obstetrics & Gynecology

## 2018-03-27 VITALS — BP 112/74

## 2018-03-27 DIAGNOSIS — Z09 Encounter for follow-up examination after completed treatment for conditions other than malignant neoplasm: Secondary | ICD-10-CM

## 2018-03-27 NOTE — Progress Notes (Signed)
    Heather [name] 1956-06-19 [id number]        61 y.o.  G2P2002   RP: Post op LPS BSO/Lysis of Adhesions on 03/05/2018  HPI: Good postop evolution with no abdominal pelvic pain currently and no fever.  Incisions well closed with no redness, pain or discharge.  No urinary tract infection symptoms.  Bowel movements normal.   OB History  Gravida Para Term Preterm AB Living  2 2 2     2   SAB TAB Ectopic Multiple Live Births          2    # Outcome Date GA Lbr Len/2nd Weight Sex Delivery Anes PTL Lv  2 Term     F CS-Unspec  N LIV  1 Term     F Vag-Spont  N LIV    Past medical history,surgical history, problem list, medications, allergies, family history and social history were all reviewed and documented in the EPIC chart.   Directed ROS with pertinent positives and negatives documented in the history of present illness/assessment and plan.  Exam:  Vitals:   03/27/18 1439  BP: 112/74   General appearance:  Normal  Abdomen: Normal, soft, not distended.  Incisions well healed.  Gynecologic exam: Vulva normal.  Bimanual exam:  S/P Total Hysterectomy.  No pelvic mass, NT.  Patho 03/05/2018: Patho: Ovaries and fallopian tubes, bilateral - BENIGN OVARIES WITH INCLUSION CYSTS. - BENIGN FALLOPIAN TUBES WITH PARATUBAL CYSTS. Peritoneal washings benign.   Assessment/Plan:  62 y.o. G2P2002   1. Follow-up examination after gynecological surgery Laparoscopic Bilateral Salpingo-Oophorectomy and extensive lysis of adhesions.  Patho benign: Patho: Bilateral Ovaries and tubes: - BENIGN OVARIES WITH INCLUSION CYSTS. - BENIGN FALLOPIAN TUBES WITH PARATUBAL CYSTS. Peritoneal washings benign.  Excellent postoperative course.  Well healed with no complication.  Recommend Replens (moisturizer, over-the-counter) for vaginal dryness.   Princess Bruins MD, 2:44 PM 03/27/2018

## 2018-03-27 NOTE — Patient Instructions (Signed)
1. Follow-up examination after gynecological surgery Laparoscopic Bilateral Salpingo-Oophorectomy and extensive lysis of adhesions.  Patho benign: Patho: Bilateral Ovaries and tubes: - BENIGN OVARIES WITH INCLUSION CYSTS. - BENIGN FALLOPIAN TUBES WITH PARATUBAL CYSTS. Peritoneal washings benign.  Excellent postoperative course.  Well healed with no complication.  Recommend Replens (moisturizer, over-the-counter) for vaginal dryness.   Heather [name], it was a pleasure seeing you today!

## 2018-04-29 ENCOUNTER — Other Ambulatory Visit: Payer: Self-pay | Admitting: Hematology and Oncology

## 2018-05-08 NOTE — Assessment & Plan Note (Deleted)
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 Systemic chemotherapy with dose dense Adriamycin and Cytoxan 4 followed by Abraxane weekly 12 started 12/29/2015 and completed 05/10/2016 2. Adjuvant radiation therapy started 06/14/2016 and completed 07/25/2016 3. Adjuvant antiestrogen therapy With anastrozole 1 mg by mouth daily started 08/09/2016  Anastrozole Toxicities: Denies any hot flashes or myalgias. She does have mild dizziness at times.  Chemo induced peripheral neuropathy: Improving slowly Surveillance:  Mammogram 12/03/17 Postsurgical changes, benign, breast density C Bone Density: 12/13/17: Osteoporosis T score -2.5 Rec Bisphosphonate therapy  RTC in one year for follow-up

## 2018-05-09 ENCOUNTER — Ambulatory Visit: Payer: BLUE CROSS/BLUE SHIELD | Admitting: Hematology and Oncology

## 2018-05-13 ENCOUNTER — Ambulatory Visit: Payer: BLUE CROSS/BLUE SHIELD | Admitting: Hematology and Oncology

## 2018-06-03 ENCOUNTER — Telehealth: Payer: Self-pay | Admitting: Hematology and Oncology

## 2018-06-03 ENCOUNTER — Inpatient Hospital Stay: Payer: BLUE CROSS/BLUE SHIELD | Attending: Hematology and Oncology | Admitting: Hematology and Oncology

## 2018-06-03 DIAGNOSIS — Z79810 Long term (current) use of selective estrogen receptor modulators (SERMs): Secondary | ICD-10-CM | POA: Diagnosis not present

## 2018-06-03 DIAGNOSIS — Z170 Estrogen receptor positive status [ER+]: Secondary | ICD-10-CM

## 2018-06-03 DIAGNOSIS — Z9221 Personal history of antineoplastic chemotherapy: Secondary | ICD-10-CM

## 2018-06-03 DIAGNOSIS — Z923 Personal history of irradiation: Secondary | ICD-10-CM

## 2018-06-03 DIAGNOSIS — Z79899 Other long term (current) drug therapy: Secondary | ICD-10-CM | POA: Diagnosis not present

## 2018-06-03 DIAGNOSIS — C50412 Malignant neoplasm of upper-outer quadrant of left female breast: Secondary | ICD-10-CM | POA: Diagnosis not present

## 2018-06-03 MED ORDER — TAMOXIFEN CITRATE 20 MG PO TABS: 20.0000 mg | ORAL_TABLET | Freq: Every day | ORAL | 3 refills | Status: DC

## 2018-06-03 NOTE — Progress Notes (Signed)
Patient Care Team: System, Pcp Not In as PCP - General  DIAGNOSIS:  Encounter Diagnosis  Name Primary?  . Malignant neoplasm of upper-outer quadrant of left breast in female, estrogen receptor positive ([place])     SUMMARY OF ONCOLOGIC HISTORY:   Breast cancer of upper-outer quadrant of left female breast ([place])   10/19/2015 Mammogram    Screening mammogram revealed left breast asymmetry 1.1 x 0.8 x 1 cm, T1 cN0 stage IA clinical stage      11/03/2015 Initial Diagnosis    Left breast biopsy 11:30 position: Invasive ductal carcinoma with DCIS, grade 2, ER 100%, PR 30%, Ki-67 90%, HER-2 negative ratio 1.44      11/22/2015 Surgery    Lt Lumpectomy: IDC 1.5 cm, 0/2 LN, grade 2, with Int Grade DCIS, ER 100%, PR 30%, Her 2 Neg Ratio 1.91, T1cN0 (stage 1A)Oncotype DX score 37, 10 year ROR 25%      12/29/2015 - 05/10/2016 Chemotherapy    Adjuvant chemotherapy with dose dense Adriamycin Cytoxan 4 followed by Abraxane weekly 10 stopped early due to neuropathy      06/14/2016 - 07/25/2016 Radiation Therapy    Adjuvant radiation therapy      08/09/2016 -  Anti-estrogen oral therapy    Anastrozole 1 mg by mouth daily switched to Tamoxifen on 06/03/18 due to Muscle pains       CHIEF COMPLIANT: Follow-up on tamoxifen therapy  INTERVAL HISTORY: Heather [name] is a 62 year old with above-mentioned history of breast cancer is currently on anastrozole therapy and is complaining of diffuse muscle aches and pains.  She just returned back from a vacation to [place] and [place] and had a wonderful time.  Since she stopped taking the anastrozole her symptoms are better.  She denies any lumps or nodules in the breast.  REVIEW OF SYSTEMS:   Constitutional: Denies fevers, chills or abnormal weight loss Eyes: Denies blurriness of vision Ears, nose, mouth, throat, and face: Denies mucositis or sore throat Respiratory: Denies cough, dyspnea or wheezes Cardiovascular: Denies palpitation, chest  discomfort Gastrointestinal:  Denies nausea, heartburn or change in bowel habits Skin: Denies abnormal skin rashes Lymphatics: Denies new lymphadenopathy or easy bruising Neurological:Denies numbness, tingling or new weaknesses Behavioral/Psych: Mood is stable, no new changes  Extremities: No lower extremity edema Breast:  denies any pain or lumps or nodules in either breasts All other systems were reviewed with the patient and are negative.  I have reviewed the past medical history, past surgical history, social history and family history with the patient and they are unchanged from previous note.  ALLERGIES:  has No Known Allergies.  MEDICATIONS:  Current Outpatient Medications  Medication Sig Dispense Refill  . Ascorbic Acid (VITAMIN C) 1000 MG tablet Take 1,000 mg by mouth daily.    Marland Kitchen b complex vitamins tablet Take 1 tablet by mouth daily.    . calcium carbonate (OS-CAL) 600 MG TABS Take 600 mg by mouth 2 (two) times daily with a meal.    . cholecalciferol (VITAMIN D) 1000 UNITS tablet Take 1,000 Units by mouth daily.    . Misc Natural Products (GLUCOSAMINE CHONDROITIN ADV PO) Take by mouth daily.    . Omega-3 Fatty Acids (OMEGA-3 FISH OIL) 500 MG CAPS Take 1 capsule by mouth daily.    Marland Kitchen oxyCODONE-acetaminophen (PERCOCET) 7.5-325 MG tablet Take 1 tablet by mouth every 4 (four) hours as needed for severe pain. 30 tablet 0  . tamoxifen (NOLVADEX) 20 MG tablet Take 1 tablet (20 mg total)  by mouth daily. 90 tablet 3  . VITAMIN E COMPLEX PO Take by mouth daily.      No current facility-administered medications for this visit.     PHYSICAL EXAMINATION: ECOG PERFORMANCE STATUS: 1 - Symptomatic but completely ambulatory  Vitals:   06/03/18 1404  BP: 130/84  Pulse: 75  Resp: 20  Temp: 97.8 F (36.6 C)  SpO2: 100%   Filed Weights   06/03/18 1404  Weight: 134 lb 11.2 oz (61.1 kg)    GENERAL:alert, no distress and comfortable SKIN: skin color, texture, turgor are normal, no  rashes or significant lesions EYES: normal, Conjunctiva are pink and non-injected, sclera clear OROPHARYNX:no exudate, no erythema and lips, buccal mucosa, and tongue normal  NECK: supple, thyroid normal size, non-tender, without nodularity LYMPH:  no palpable lymphadenopathy in the cervical, axillary or inguinal LUNGS: clear to auscultation and percussion with normal breathing effort HEART: regular rate & rhythm and no murmurs and no lower extremity edema ABDOMEN:abdomen soft, non-tender and normal bowel sounds MUSCULOSKELETAL:no cyanosis of digits and no clubbing  NEURO: alert & oriented x 3 with fluent speech, no focal motor/sensory deficits EXTREMITIES: No lower extremity edema BREAST: No palpable masses or nodules in either right or left breasts. No palpable axillary supraclavicular or infraclavicular adenopathy no breast tenderness or nipple discharge. (exam performed in the presence of a chaperone)  LABORATORY DATA:  I have reviewed the data as listed CMP Latest Ref Rng & Units 08/09/2016 05/10/2016 05/03/2016  Glucose 70 - 140 mg/dl 82 98 85  BUN 7.0 - 26.0 mg/dL 9.6 11.8 8.3  Creatinine 0.6 - 1.1 mg/dL 0.7 0.7 0.7  Sodium 136 - 145 mEq/L 142 140 142  Potassium 3.5 - 5.1 mEq/L 3.9 3.9 4.7  CO2 22 - 29 mEq/L _0 Calcium 8.4 - 10.4 mg/dL 9.5 9.6 10.0  Total Protein 6.4 - 8.3 g/dL 7.2 7.1 7.3  Total Bilirubin 0.20 - 1.20 mg/dL 0.48 <0.30 <0.30  Alkaline Phos 40 - 150 U/L 95 100 106  AST 5 - 34 U/L 31 35(H) 41(H)  ALT 0 - 55 U/L 34 31 37    Lab Results  Component Value Date   WBC 4.3 03/05/2018   HGB 13.8 03/05/2018   HCT 40.3 03/05/2018   MCV 91.2 03/05/2018   PLT 189 03/05/2018   NEUTROABS 2.4 08/09/2016    ASSESSMENT & PLAN:  Breast cancer of upper-outer quadrant of left female breast ([place]) Lt Lumpectomy 11/22/15: IDC 1.5 cm, 0/2 LN, grade 2, with Int Grade DCIS, ER 100%, PR 30%, Her 2 Neg Ratio 1.91, T1cN0 (stage 1A), Oncotype DX recurrence score 37, 25% 10 year  risk of recurrence  Treatment summary:  1. Systemic chemotherapy with dose dense Adriamycin and Cytoxan 4 followed by Abraxane weekly 12 started 12/29/2015 and completed 05/10/2016 2. Adjuvant radiation therapy started 06/14/2016 and completed 07/25/2016 3. Adjuvant antiestrogen therapy With anastrozole 1 mg by mouth daily started 08/09/2016  Anastrozole Toxicities: Complains of severe myalgias.  Because of this we will switch her to tamoxifen. I discussed with her the risks and benefits of tamoxifen therapy.  Chemo induced peripheral neuropathy: Improving slowly Surveillance:  Mammogram 12/03/17 Postsurgical changes, benign, breast density C Bone Density: 12/13/17: Osteoporosis T score -2.5 Rec Bisphosphonate therapy  RTC in one year for follow-up   No orders of the defined types were placed in this encounter.  The patient has a good understanding of the overall plan. she agrees with it. she will call with any problems  that may develop before the next visit here.   Harriette Ohara, MD 06/03/18

## 2018-06-03 NOTE — Telephone Encounter (Signed)
Gave avs and calendar ° °

## 2018-06-03 NOTE — Assessment & Plan Note (Signed)
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 Systemic chemotherapy with dose dense Adriamycin and Cytoxan 4 followed by Abraxane weekly 12 started 12/29/2015 and completed 05/10/2016 2. Adjuvant radiation therapy started 06/14/2016 and completed 07/25/2016 3. Adjuvant antiestrogen therapy With anastrozole 1 mg by mouth daily started 08/09/2016  Anastrozole Toxicities: Denies any hot flashes or myalgias. She does have mild dizziness at times.  Chemo induced peripheral neuropathy: Improving slowly Surveillance:  Mammogram 12/03/17 Postsurgical changes, benign, breast density C Bone Density: 12/13/17: Osteoporosis T score -2.5 Rec Bisphosphonate therapy  RTC in one year for follow-up

## 2018-06-13 ENCOUNTER — Telehealth: Payer: Self-pay

## 2018-06-13 NOTE — Telephone Encounter (Signed)
Returned patient's call regarding new medication, Tamoxifen.  Patient started taking Tamoxifen after not being able to tolerate Anastrozole.  Per patient, stopped Anastrozole on 06/03/2018 and started Tamoxifen on 06/03/2018.  Patient c/o continued aches and joint pain.  Advised patient to stop Tamoxifen for two weeks and to resume therapy on 06/27/2018 so we can see if Tamoxifen is causing aches or if this was residual effects from Anastrozole, per Dr. Geralyn Flash recommendations.  Patient will call to follow up with how she is tolerating.  No further needs at this time.

## 2018-10-03 ENCOUNTER — Telehealth: Payer: Self-pay | Admitting: *Deleted

## 2018-10-03 NOTE — Telephone Encounter (Signed)
Patient called c/o clear vaginal discharge x 3 weeks now, no urinary symptoms, no itching or odor. Patient will schedule appointment at next available slot, transferred to appointment desk.

## 2018-10-07 ENCOUNTER — Ambulatory Visit: Payer: BLUE CROSS/BLUE SHIELD | Admitting: Women's Health

## 2018-10-15 ENCOUNTER — Encounter: Payer: Self-pay | Admitting: Obstetrics & Gynecology

## 2018-10-15 ENCOUNTER — Ambulatory Visit: Payer: BLUE CROSS/BLUE SHIELD | Admitting: Obstetrics & Gynecology

## 2018-10-15 VITALS — BP 126/84

## 2018-10-15 DIAGNOSIS — N898 Other specified noninflammatory disorders of vagina: Secondary | ICD-10-CM | POA: Diagnosis not present

## 2018-10-15 DIAGNOSIS — R300 Dysuria: Secondary | ICD-10-CM | POA: Diagnosis not present

## 2018-10-15 LAB — WET PREP FOR TRICH, YEAST, CLUE

## 2018-10-15 MED ORDER — CLINDAMYCIN PHOSPHATE 2 % VA CREA: 1.0000 | TOPICAL_CREAM | Freq: Every day | VAGINAL | 0 refills | Status: AC

## 2018-10-15 NOTE — Progress Notes (Signed)
    Heather [name] 06-05-1956 [id number]        62 y.o.  [id number]   RP: Dysuria with urinary frequency and urgency/increased vaginal discharge  HPI: Status post total hysterectomy.  And post laparoscopy BSO with lysis of adhesions Mar 05, 2018.  Pathology benign.  Complains of pain with urination and urinary frequency with urgency on and off for a few weeks.  No flank pain or back pain and no fever.  Also complains of increased vaginal discharge but with no itching or odor.   OB History  Gravida Para Term Preterm AB Living  2 2 2     2   SAB TAB Ectopic Multiple Live Births          2    # Outcome Date GA Lbr Len/2nd Weight Sex Delivery Anes PTL Lv  2 Term     F CS-Unspec  N LIV  1 Term     F Vag-Spont  N LIV    Past medical history,surgical history, problem list, medications, allergies, family history and social history were all reviewed and documented in the EPIC chart.   Directed ROS with pertinent positives and negatives documented in the history of present illness/assessment and plan.  Exam:  Vitals:   10/15/18 1414  BP: 126/84   General appearance:  Normal  CVAT negative bilaterally  Abdomen: Normal  Gynecologic exam: Vulva normal.  Speculum:  Vagina normal.  Increased vaginal discharge.  Wet prep done.  Wet prep: Yeast negative, clue cells negative, moderate white blood cells and moderate bacteria.  U/A: Yellow clear, protein negative, nitrites negative, White blood cells 10-20, red blood cells negative, bacteria few, yeast negative.  Urine culture pending.   Assessment/Plan:  62 y.o. [id number]   1. Dysuria Urine analysis mildly perturbed.  Will wait on urine culture before deciding on treatment.  Increase water intake. - Urinalysis,Complete w/RFL Culture  2. 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  Will treat with clindamycin cream vaginally for 7 days.  Usage reviewed with patient and prescription sent to  pharmacy.  Other orders - clindamycin (CLEOCIN) 2 % vaginal cream; Place 1 Applicatorful vaginally at bedtime for 7 days.  Counseling on above issues and coordination of care more than 50% for 15 minutes.  Princess Bruins MD, 2:25 PM 10/15/2018

## 2018-10-15 NOTE — Patient Instructions (Signed)
1. Dysuria Urine analysis mildly perturbed.  Will wait on urine culture before deciding on treatment.  Increase water intake. - Urinalysis,Complete w/RFL Culture  2. 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  Will treat with clindamycin cream vaginally for 7 days.  Usage reviewed with patient and prescription sent to pharmacy.  Other orders - clindamycin (CLEOCIN) 2 % vaginal cream; Place 1 Applicatorful vaginally at bedtime for 7 days.  Rumor, fue un placer verle hoy!  Voy a informarle de sus [employer].

## 2018-10-15 NOTE — Addendum Note (Signed)
Addended by: Thurnell Garbe A on: 10/15/2018 03:53 PM   Modules accepted: Orders

## 2018-10-17 LAB — URINE CULTURE
MICRO NUMBER:: 91511965
SPECIMEN QUALITY:: ADEQUATE

## 2018-10-17 LAB — URINALYSIS, COMPLETE W/RFL CULTURE
Bilirubin Urine: NEGATIVE
Glucose, UA: NEGATIVE
Hgb urine dipstick: NEGATIVE
Hyaline Cast: NONE SEEN /LPF
Ketones, ur: NEGATIVE
Nitrites, Initial: NEGATIVE
Protein, ur: NEGATIVE
RBC / HPF: NONE SEEN /HPF (ref 0–2)
Specific Gravity, Urine: 1.001 (ref 1.001–1.03)
pH: 6 (ref 5.0–8.0)

## 2018-10-17 LAB — CULTURE INDICATED

## 2018-11-06 ENCOUNTER — Other Ambulatory Visit: Payer: Self-pay

## 2018-11-06 MED ORDER — LETROZOLE 2.5 MG PO TABS: 2.5000 mg | ORAL_TABLET | Freq: Every day | ORAL | 0 refills | Status: DC

## 2018-11-06 NOTE — Progress Notes (Signed)
Pt called to report that she has been off tamoxifen for 1.24months and all her symptoms have improved (dizziness, bone pain, aches, and numbness hands/feet). Pt would like to know what alternative medication she can take. Told pt that she can switch to letrozole, but side effects would be similar. Told pt that her side effects may be milder with this medication. Per Dr.Gudena, ok to switch to letrozole. Will fill for 30 days until pt calls back to report if she is tolerating medication. Pt advised to start letrozole slowly, and to take every other day at night x1-2 weeks. If she tolerates it, she can increase to daily intake. Pt will call at the end of 30 days to update with results. Told pt that 30 minutes of walking or physical activity can combat the aches/pains, as well as proper daily hydration. Pt verbalized understanding and will update in a few weeks. Sent letrozole to preferred pharmacy.

## 2018-11-11 ENCOUNTER — Telehealth: Payer: Self-pay | Admitting: *Deleted

## 2018-11-11 NOTE — Telephone Encounter (Signed)
-----   Message from Sinclair Grooms sent at 11/11/2018 10:50 AM EST ----- Regarding: nurse call Patient called stating she still has the same problem as December that the [employer] send did not help. Patient wants to know if she can prescribe something else. Patient can not come in.

## 2018-11-11 NOTE — Telephone Encounter (Signed)
Dr.Lavoie see the below patient was treated with clindamycin cream vaginally for 7 days at [facility] on 10/15/2018.

## 2018-11-12 ENCOUNTER — Other Ambulatory Visit: Payer: Self-pay | Admitting: Women's Health

## 2018-11-12 MED ORDER — METRONIDAZOLE 0.75 % VA GEL: VAGINAL | 0 refills | Status: DC

## 2018-11-12 NOTE — Telephone Encounter (Signed)
Tried to call her, she is Spanish-speaking no answer and no voicemail.  Sent in a Rx for MetroGel vaginal cream 1 applicator at bedtime for 5 nights with alcohol precautions.  Have her call if still no relief

## 2018-11-12 NOTE — Telephone Encounter (Signed)
I asked [name] to call and let her know. Rosemarie Ax called her but it became more involved so she had [place] speak with patient.

## 2018-11-12 NOTE — Telephone Encounter (Addendum)
Patient called because she has not gotten an answer and left message yesterday. [place], since Dr. Marguerita Merles for jury duty tomorrow too do you feel comfortable addressing this for her?

## 2018-11-13 ENCOUNTER — Ambulatory Visit: Payer: BLUE CROSS/BLUE SHIELD | Admitting: Women's Health

## 2018-11-13 ENCOUNTER — Encounter: Payer: Self-pay | Admitting: Women's Health

## 2018-11-13 VITALS — BP 120/78

## 2018-11-13 DIAGNOSIS — R300 Dysuria: Secondary | ICD-10-CM

## 2018-11-13 DIAGNOSIS — N898 Other specified noninflammatory disorders of vagina: Secondary | ICD-10-CM

## 2018-11-13 LAB — WET PREP FOR TRICH, YEAST, CLUE

## 2018-11-13 NOTE — Progress Notes (Signed)
63 year old MWF G2 P2 with history of hysterectomy and 02/2018 laparoscopic BSO with lysis of adhesions, benign pathology.  Left breast cancer history on Femara.  Was treated with clindamycin gel with minimal relief last month and has continued with vaginal burning, vaginal discomfort, mild itching.  Has had some increased urinary frequency without pain at end of stream of urination.  States has noticed some mucousy type vaginal discharge without odor.. Used 1 dose of MetroGel last night some relief but wanted to be sure.  Dryness with intercourse.  No other known medical problems.  Exam: Appears well.  Abdomen soft, nontender, external genitalia erythemic at introitus, speculum exam, atrophic, wet prep negative, bimanual vaginal cuff intact slight tenderness vaginally.  No adnexal tenderness. UA: Negative nitrites, negative leukocytes, no WBCs, no RBCs, no bacteria  Probable vaginal atrophy  Plan: Options reviewed, reviewed best not to use estrogen unless it would be localized vaginal estrogen declines, will try KY liquid beads or Replens, or refresh 2-3 times weekly vaginally.  Follow-up with Dr. Dellis Filbert if continued problems.  Reassurance given.

## 2018-11-13 NOTE — Patient Instructions (Signed)
KY liquid beads insert 1 bleed 2-3 times weekly or plain KY with intercourse.  Also could try refresh/Replens vaginal lubricant they come in an applicator  Vaginitis atrfica Atrophic Vaginitis La vaginitis atrfica es una afeccin en la que los tejidos que recubren la vagina se secan y [place]. Esta afeccin se produce en las mujeres que ya no tienen su perodo menstrual. Una cada en la produccin de una hormona femenina (estrgeno) causa esta afeccin. Esta hormona ayuda a:  Mantener la humedad de la vagina.  Producir un lquido transparente. Este lquido transparente [place] a: ? Hacer que la vagina est lista para Adult nurse. ? Proteger la vagina de las infecciones. Si el recubrimiento de la vagina se seca y afina, puede causar irritacin, sensacin de ardor o picazn. Tambin puede causar:  [place].  Dolor durante el examen vaginal.  Sangrado.  Falta de inters en el sexo.  Sensacin de ardor al [employer].  La presencia de un lquido marrn o [place] proveniente de la vagina. Algunas mujeres no tienen sntomas. Siga estas indicaciones en su casa: Medicamentos  [employer] de venta libre y los recetados solamente como se lo haya indicado el mdico.  No use ningn medicamento a base de hierbas u otro tipo de medicamento, excepto que el mdico lo apruebe.  Use medicamentos para la sequedad. Estos incluyen: ? Aceites para lubricar la vagina. ? Cremas. ? Cremas hidratantes. Indicaciones generales  No se haga duchas vaginales.  No use productos que puedan causarle sequedad vaginal. Estos incluyen: ? Aerosoles perfumados. ? Tampones perfumados. ? Jabones perfumados.  El sexo ayuda a aumentar el flujo sanguneo y a Museum/gallery conservator los tejidos de la vagina. Si siente dolor al Raynelle Jan sexo: ? Dgaselo a su pareja. ? Use productos para que el sexo sean ms placenteras. Use estos productos segn las indicaciones del mdico. Comunquese con un mdico  si:  Presenta una secrecin proveniente de la vagina que es diferente de lo habitual.  Nota un olor ftido que proviene de la vagina.  Tiene nuevos sntomas.  Los sntomas no mejoran.  Empeoran. Resumen  La vaginitis atrfica es una afeccin en la que el recubrimiento la vagina se seca y [place].  Esta afeccin afecta a las mujeres que ya no tienen su perodo menstrual.  El tratamiento puede incluir el uso de productos que ayudan a Chief Technology Officer vagina.  Comunquese con un mdico si no mejora con el tratamiento. Esta informacin no tiene Marine scientist el consejo del mdico. Asegrese de hacerle al mdico cualquier pregunta que tenga. Document Released: 01/12/2009 Document Revised: 11/29/2017 Document Reviewed: 11/29/2017 Elsevier Interactive Patient Education  2019 [employer].

## 2018-11-14 LAB — NO CULTURE INDICATED

## 2018-11-14 LAB — URINALYSIS, COMPLETE W/RFL CULTURE
Bacteria, UA: NONE SEEN /HPF
Bilirubin Urine: NEGATIVE
Glucose, UA: NEGATIVE
Hgb urine dipstick: NEGATIVE
Hyaline Cast: NONE SEEN /LPF
Ketones, ur: NEGATIVE
Leukocyte Esterase: NEGATIVE
Nitrites, Initial: NEGATIVE
Protein, ur: NEGATIVE
RBC / HPF: NONE SEEN /HPF (ref 0–2)
Specific Gravity, Urine: 1.001 (ref 1.001–1.03)
WBC, UA: NONE SEEN /HPF (ref 0–5)
pH: 7 (ref 5.0–8.0)

## 2018-11-14 NOTE — Telephone Encounter (Signed)
Agree with Metrogel prescription.

## 2018-11-21 ENCOUNTER — Other Ambulatory Visit: Payer: Self-pay | Admitting: Hematology and Oncology

## 2018-11-21 DIAGNOSIS — Z9889 Other specified postprocedural states: Secondary | ICD-10-CM

## 2018-12-09 ENCOUNTER — Other Ambulatory Visit: Payer: Self-pay | Admitting: Hematology and Oncology

## 2018-12-18 ENCOUNTER — Ambulatory Visit
Admission: RE | Admit: 2018-12-18 | Discharge: 2018-12-18 | Disposition: A | Discharge status: Home / Self Care | Payer: BLUE CROSS/BLUE SHIELD | Source: Ambulatory Visit | Attending: Hematology and Oncology | Admitting: Hematology and Oncology

## 2018-12-18 DIAGNOSIS — R928 Other abnormal and inconclusive findings on diagnostic imaging of breast: Secondary | ICD-10-CM | POA: Diagnosis not present

## 2018-12-18 DIAGNOSIS — Z9889 Other specified postprocedural states: Secondary | ICD-10-CM

## 2018-12-18 DIAGNOSIS — Z853 Personal history of malignant neoplasm of breast: Secondary | ICD-10-CM | POA: Diagnosis not present

## 2019-05-15 ENCOUNTER — Telehealth: Payer: Self-pay | Admitting: Hematology and Oncology

## 2019-05-15 NOTE — Telephone Encounter (Signed)
[place] 8/5 moved f/u to PM. Not able to reach patient or leave message. Schedule mailed.

## 2019-05-28 ENCOUNTER — Telehealth: Payer: Self-pay | Admitting: Hematology and Oncology

## 2019-05-28 NOTE — Telephone Encounter (Signed)
I talk with patient regarding video visit °

## 2019-05-28 NOTE — Assessment & Plan Note (Signed)
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 Systemic chemotherapy with dose dense Adriamycin and Cytoxan 4 followed by Abraxane weekly 12 started 12/29/2015 and completed 05/10/2016 2. Adjuvant radiation therapy started 06/14/2016 and completed 07/25/2016 3. Adjuvant antiestrogen therapy With anastrozole 1 mg by mouth daily started 08/09/2016 switched to Tamoxifen 06/03/18 due to myalgias  Tamoxifen Toxicities:    Chemo induced peripheral neuropathy:Improving slowly Surveillance:  Mammogram 12/18/18 Postsurgical changes, benign, breast density B Bone Density: 12/13/17: Osteoporosis T score -2.5 Rec Bisphosphonate therapy  RTC in one year for follow-up

## 2019-06-03 ENCOUNTER — Telehealth: Payer: Self-pay | Admitting: Hematology and Oncology

## 2019-06-03 NOTE — Progress Notes (Signed)
HEMATOLOGY-ONCOLOGY MYCHART VIDEO VISIT PROGRESS NOTE  I connected with SPECIAL RANES on 06/04/2019 at  2:30 PM EDT by MyChart video conference and verified that I am speaking with the correct person using two identifiers.  I discussed the limitations, risks, security and privacy concerns of performing an evaluation and management service by MyChart and the availability of in person appointments.  I also discussed with the patient that there may be a patient responsible charge related to this service. The patient expressed understanding and agreed to proceed.  Patient's Location: Home Physician Location: Clinic  CHIEF COMPLIANT: Follow-up of left breast cancer on tamoxifen therapy  INTERVAL HISTORY: Heather [name] is a 63 y.o. female with above-mentioned history of left breast cancer treated with lumpectomy, adjuvant chemotherapy, radiation, and who is currently on anti-estrogen therapy with letrozole. I last saw her a year ago. Mammogram on 12/18/18 showed no evidence of malignancy bilaterally. She presents over MyChart today for annual follow-up.  She could not tolerate the tamoxifen or letrozole.  She tells me that the first medication which was anastrozole she was able to tolerate it for 2 years.  She would like to try that once again.  She tells me that the neuropathy appears to be continuing to be a problem.  She continues to have numbness in the fingers and toes and when she takes the antiestrogen therapy she also has pain.  She has not taken antiestrogens for the past 3 months and her pain has improved.  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%, Her 2 Neg Ratio 1.91, T1cN0 (stage 1A)Oncotype DX score 37, 10 year ROR 25%   12/29/2015 - 05/10/2016 Chemotherapy   Adjuvant chemotherapy with dose dense Adriamycin Cytoxan 4 followed by Abraxane weekly 10 stopped early due to neuropathy   06/14/2016 - 07/25/2016 Radiation Therapy   Adjuvant radiation therapy   08/09/2016 -  Anti-estrogen oral therapy   Anastrozole 1 mg by mouth daily switched to Tamoxifen on 06/03/18 due to Muscle pains     REVIEW OF SYSTEMS:   Constitutional: Denies fevers, chills or abnormal weight loss Eyes: Denies blurriness of vision Ears, nose, mouth, throat, and face: Denies mucositis or sore throat Respiratory: Denies cough, dyspnea or wheezes Cardiovascular: Denies palpitation, chest discomfort Gastrointestinal:  Denies nausea, heartburn or change in bowel habits Skin: Denies abnormal skin rashes Lymphatics: Denies new lymphadenopathy or easy bruising Neurological: Chemo-induced peripheral neuropathy Behavioral/Psych: Mood is stable, no new changes  Extremities: No lower extremity edema Breast: denies any pain or lumps or nodules in either breasts All other systems were reviewed with the patient and are negative.  Observations/Objective:  There were no vitals filed for this visit. There is no height or weight on file to calculate BMI.  I have reviewed the data as listed CMP Latest Ref Rng & Units 08/09/2016 05/10/2016 05/03/2016  Glucose 70 - 140 mg/dl 82 98 85  BUN 7.0 - 26.0 mg/dL 9.6 11.8 8.3  Creatinine 0.6 - 1.1 mg/dL 0.7 0.7 0.7  Sodium 136 - 145  mEq/L 142 140 142  Potassium 3.5 - 5.1 mEq/L 3.9 3.9 4.7  CO2 22 - 29 mEq/L _0 Calcium 8.4 - 10.4 mg/dL 9.5 9.6 10.0  Total Protein 6.4 - 8.3 g/dL 7.2 7.1 7.3  Total Bilirubin 0.20 - 1.20 mg/dL 0.48 <0.30 <0.30  Alkaline Phos 40 - 150 U/L 95 100 106  AST 5 - 34 U/L 31 35(H) 41(H)  ALT 0 - 55 U/L 34 31 37     Lab Results  Component Value Date   WBC 4.3 03/05/2018   HGB 13.8 03/05/2018   HCT 40.3 03/05/2018   MCV 91.2 03/05/2018   PLT 189 03/05/2018   NEUTROABS 2.4 08/09/2016      Assessment Plan:  Breast cancer of upper-outer quadrant of left female breast ([place]) Lt Lumpectomy 11/22/15: 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 Systemic chemotherapy with dose dense Adriamycin and Cytoxan 4 followed by Abraxane weekly 12 started 12/29/2015 and completed 05/10/2016 2. Adjuvant radiation therapy started 06/14/2016 and completed 07/25/2016 3. Adjuvant antiestrogen therapy With anastrozole 1 mg by mouth daily started 08/09/2016 switched to Tamoxifen 06/03/18 due to myalgias then switched to letrozole  Letrozole toxicities:  stopped due to pain and numbness Feeling better since she stopped Because she is not able to tolerate letrozole as well we will discontinue letrozole and will switch her to anastrozole at half a tablet daily.  She tells me that anastrozole was only medication which he tolerated for 2 years.  Chemo induced peripheral neuropathy:Stable Surveillance:  Mammogram 12/18/18 Postsurgical changes, benign, breast density B Bone Density: 12/13/17: Osteoporosis T score -2.5 Rec Bisphosphonate therapy  RTC in 3 months for Doximity video visit to assess adverse effects to anastrozole therapy.  I discussed the assessment and treatment plan with the patient. The patient was provided an opportunity to ask questions and all were answered. The patient agreed with the plan and demonstrated an understanding of the instructions. The patient was advised to call back or seek an in-person evaluation if the symptoms worsen or if the condition fails to improve as anticipated.   I provided 15 minutes of face-to-face MyChart video visit time  during this encounter.    Rulon Eisenmenger, MD 06/04/2019   I, Molly Dorshimer, am acting as scribe for Nicholas Lose, MD.  I have reviewed the above documentation for accuracy and completeness, and I agree with the above.

## 2019-06-04 ENCOUNTER — Inpatient Hospital Stay: Payer: BLUE CROSS/BLUE SHIELD | Attending: Hematology and Oncology | Admitting: Hematology and Oncology

## 2019-06-04 DIAGNOSIS — Z79811 Long term (current) use of aromatase inhibitors: Secondary | ICD-10-CM

## 2019-06-04 DIAGNOSIS — Z923 Personal history of irradiation: Secondary | ICD-10-CM

## 2019-06-04 DIAGNOSIS — C50412 Malignant neoplasm of upper-outer quadrant of left female breast: Secondary | ICD-10-CM

## 2019-06-04 DIAGNOSIS — Z170 Estrogen receptor positive status [ER+]: Secondary | ICD-10-CM | POA: Diagnosis not present

## 2019-06-04 DIAGNOSIS — Z9221 Personal history of antineoplastic chemotherapy: Secondary | ICD-10-CM

## 2019-06-04 MED ORDER — ANASTROZOLE 1 MG PO TABS: 1.0000 mg | ORAL_TABLET | Freq: Every day | ORAL | 3 refills | Status: DC

## 2019-06-05 ENCOUNTER — Ambulatory Visit: Payer: BLUE CROSS/BLUE SHIELD | Admitting: Hematology and Oncology

## 2019-06-05 ENCOUNTER — Telehealth: Payer: Self-pay | Admitting: Hematology and Oncology

## 2019-06-05 NOTE — Telephone Encounter (Signed)
I talk with patient regarding video visit °

## 2019-07-29 ENCOUNTER — Encounter: Payer: Self-pay | Admitting: Gynecology

## 2019-08-27 ENCOUNTER — Telehealth: Payer: Self-pay

## 2019-08-27 NOTE — Telephone Encounter (Signed)
Pt calls to report she no longer can tolerate the anastrozole.  Pt reports significant joint pain, unable to sleep at night.   Pt currently taking Anastrozole 1mg , 1/2 tablet daily.  RN encouraged patient to try medication, every other day to see if pain decreases.  Pt voiced she can't go on like this, "I don't want to take it all any longer."    Pt plans to discontinue anastrozole, has follow up with MD on 11/5 and will keep this appointment to review different options.

## 2019-09-03 NOTE — Progress Notes (Signed)
Patient Care Team: System, Pcp Not In as PCP - General  DIAGNOSIS:    ICD-10-CM   1. Malignant neoplasm of upper-outer quadrant of left breast in female, estrogen receptor positive ([place])  C50.412    Z17.0     SUMMARY OF ONCOLOGIC HISTORY: Oncology History  Breast cancer of upper-outer quadrant of left female breast ([place])  10/19/2015 Mammogram   Screening mammogram revealed left breast asymmetry 1.1 x 0.8 x 1 cm, T1 cN0 stage IA clinical stage   11/03/2015 Initial Diagnosis   Left breast biopsy 11:30 position: Invasive ductal carcinoma with DCIS, grade 2, ER 100%, PR 30%, Ki-67 90%, HER-2 negative ratio 1.44   11/22/2015 Surgery   Lt Lumpectomy: IDC 1.5 cm, 0/2 LN, grade 2, with Int Grade DCIS, ER 100%, PR 30%, Her 2 Neg Ratio 1.91, T1cN0 (stage 1A)Oncotype DX score 37, 10 year ROR 25%   12/29/2015 - 05/10/2016 Chemotherapy   Adjuvant chemotherapy with dose dense Adriamycin Cytoxan 4 followed by Abraxane weekly 10 stopped early due to neuropathy   06/14/2016 - 07/25/2016 Radiation Therapy   Adjuvant radiation therapy   08/09/2016 -  Anti-estrogen oral therapy   Anastrozole 1 mg by mouth daily switched to Tamoxifen on 06/03/18 due to Muscle pains, switched to letrozole, once again switched back to anastrozole 06/04/2019 (1/2 tab)     CHIEF COMPLIANT: Follow-up of left breast cancer on tamoxifen therapy  INTERVAL HISTORY: Heather [name] is a 63 y.o. with above-mentioned history of left breast cancer treated with lumpectomy, adjuvant chemotherapy, radiation, and who is currently on anti-estrogen therapy with anastrozole, which was discontinued on 08/27/19 due to joint pain. She presents to the clinic today for follow-up.  She could not even tolerate half a tablet of anastrozole and she discontinued it.  She it causes a lot of aches and pains.  REVIEW OF SYSTEMS:   Constitutional: Denies fevers, chills or abnormal weight loss Eyes: Denies blurriness of vision Ears, nose, mouth, throat,  and face: Denies mucositis or sore throat, complains of losing her molar and is thinking of getting a implant. Respiratory: Denies cough, dyspnea or wheezes Cardiovascular: Denies palpitation, chest discomfort Gastrointestinal: Denies nausea, heartburn or change in bowel habits Skin: Denies abnormal skin rashes Lymphatics: Denies new lymphadenopathy or easy bruising Neurological: Denies numbness, tingling or new weaknesses Behavioral/Psych: Mood is stable, no new changes  Extremities: No lower extremity edema Breast: denies any pain or lumps or nodules in either breasts All other systems were reviewed with the patient and are negative.  I have reviewed the past medical history, past surgical history, social history and family history with the patient and they are unchanged from previous note.  ALLERGIES:  has No Known Allergies.  MEDICATIONS:  Current Outpatient Medications  Medication Sig Dispense Refill  . anastrozole (ARIMIDEX) 1 MG tablet Take 1 tablet (1 mg total) by mouth daily. 90 tablet 3  . Ascorbic Acid (VITAMIN C) 1000 MG tablet Take 1,000 mg by mouth daily.    Marland Kitchen b complex vitamins tablet Take 1 tablet by mouth daily.    . calcium carbonate (OS-CAL) 600 MG TABS Take 600 mg by mouth 2 (two) times daily with a meal.    . cholecalciferol (VITAMIN D) 1000 UNITS tablet Take 1,000 Units by mouth daily.    Marland Kitchen exemestane (AROMASIN) 25 MG tablet Take 1 tablet (25 mg total) by mouth daily after breakfast. 30 tablet 0  . metroNIDAZOLE (METROGEL VAGINAL) 0.75 % vaginal gel Place one applicator in vagina daily  for 5 nights  Avoid alcohol while taking 70 g 0  . Misc Natural Products (GLUCOSAMINE CHONDROITIN ADV PO) Take by mouth daily.    . Omega-3 Fatty Acids (OMEGA-3 FISH OIL) 500 MG CAPS Take 1 capsule by mouth daily.    Marland Kitchen VITAMIN E COMPLEX PO Take by mouth daily.      No current facility-administered medications for this visit.     PHYSICAL EXAMINATION: ECOG PERFORMANCE STATUS: 1 -  Symptomatic but completely ambulatory  Vitals:   09/04/19 1115  BP: 119/84  Pulse: 70  Resp: 20  Temp: 98 F (36.7 C)  SpO2: 100%   Filed Weights   09/04/19 1115  Weight: 138 lb 4.8 oz (62.7 kg)    GENERAL: alert, no distress and comfortable SKIN: skin color, texture, turgor are normal, no rashes or significant lesions EYES: normal, Conjunctiva are pink and non-injected, sclera clear OROPHARYNX: no exudate, no erythema and lips, buccal mucosa, and tongue normal  NECK: supple, thyroid normal size, non-tender, without nodularity LYMPH: no palpable lymphadenopathy in the cervical, axillary or inguinal LUNGS: clear to auscultation and percussion with normal breathing effort HEART: regular rate & rhythm and no murmurs and no lower extremity edema ABDOMEN: abdomen soft, non-tender and normal bowel sounds MUSCULOSKELETAL: no cyanosis of digits and no clubbing  NEURO: alert & oriented x 3 with fluent speech, no focal motor/sensory deficits EXTREMITIES: No lower extremity edema  LABORATORY DATA:  I have reviewed the data as listed CMP Latest Ref Rng & Units 08/09/2016 05/10/2016 05/03/2016  Glucose 70 - 140 mg/dl 82 98 85  BUN 7.0 - 26.0 mg/dL 9.6 11.8 8.3  Creatinine 0.6 - 1.1 mg/dL 0.7 0.7 0.7  Sodium 136 - 145 mEq/L 142 140 142  Potassium 3.5 - 5.1 mEq/L 3.9 3.9 4.7  CO2 22 - 29 mEq/L _0 Calcium 8.4 - 10.4 mg/dL 9.5 9.6 10.0  Total Protein 6.4 - 8.3 g/dL 7.2 7.1 7.3  Total Bilirubin 0.20 - 1.20 mg/dL 0.48 <0.30 <0.30  Alkaline Phos 40 - 150 U/L 95 100 106  AST 5 - 34 U/L 31 35(H) 41(H)  ALT 0 - 55 U/L 34 31 37    Lab Results  Component Value Date   WBC 4.3 03/05/2018   HGB 13.8 03/05/2018   HCT 40.3 03/05/2018   MCV 91.2 03/05/2018   PLT 189 03/05/2018   NEUTROABS 2.4 08/09/2016    ASSESSMENT & PLAN:  Breast cancer of upper-outer quadrant of left female breast ([place]) Lt Lumpectomy 11/22/15: IDC 1.5 cm, 0/2 LN, grade 2, with Int Grade DCIS, ER 100%, PR 30%, Her 2  Neg Ratio 1.91, T1cN0 (stage 1A), Oncotype DX recurrence score 37, 25% 10 year risk of recurrence  Treatment summary:  1. Systemic chemotherapy with dose dense Adriamycin and Cytoxan 4 followed by Abraxane weekly 12 started 12/29/2015 and completed 05/10/2016 2. Adjuvant radiation therapy started 06/14/2016 and completed 07/25/2016 3. Adjuvant antiestrogen therapy With anastrozole 1 mg by mouth daily started 08/09/2016 switched to Tamoxifen 06/03/18 due to myalgias then switched to letrozole, switched to anastrozole 06/04/2019  Anastrozole toxicities:  Could not tolerate it then discontinue it. I discussed with her about exemestane but I sent a prescription for exemestane she [name] take half a tablet daily. If she cannot tolerate it then we [name] discontinue further antiestrogen therapy.  Chemo induced peripheral neuropathy:Stable Surveillance:  Mammogram 12/18/18 Postsurgical changes, benign, breast density B Bone Density: 12/13/17: Osteoporosis T score -2.5 Rec Bisphosphonate therapy  RTC in  1 month for telephone visit    No orders of the defined types were placed in this encounter.  The patient has a good understanding of the overall plan. she agrees with it. she [name] call with any problems that may develop before the next visit here.  Nicholas Lose, MD 09/04/2019  Julious Oka Dorshimer am acting as scribe for Dr. Nicholas Lose.  I have reviewed the above documentation for accuracy and completeness, and I agree with the above.

## 2019-09-04 ENCOUNTER — Inpatient Hospital Stay: Payer: BC Managed Care – PPO | Attending: Hematology and Oncology | Admitting: Hematology and Oncology

## 2019-09-04 ENCOUNTER — Other Ambulatory Visit: Payer: Self-pay

## 2019-09-04 DIAGNOSIS — Z9221 Personal history of antineoplastic chemotherapy: Secondary | ICD-10-CM | POA: Insufficient documentation

## 2019-09-04 DIAGNOSIS — Z79810 Long term (current) use of selective estrogen receptor modulators (SERMs): Secondary | ICD-10-CM | POA: Diagnosis not present

## 2019-09-04 DIAGNOSIS — Z170 Estrogen receptor positive status [ER+]: Secondary | ICD-10-CM | POA: Diagnosis not present

## 2019-09-04 DIAGNOSIS — G620 Drug-induced polyneuropathy: Secondary | ICD-10-CM | POA: Diagnosis not present

## 2019-09-04 DIAGNOSIS — Z79899 Other long term (current) drug therapy: Secondary | ICD-10-CM | POA: Insufficient documentation

## 2019-09-04 DIAGNOSIS — Z923 Personal history of irradiation: Secondary | ICD-10-CM | POA: Diagnosis not present

## 2019-09-04 DIAGNOSIS — C50412 Malignant neoplasm of upper-outer quadrant of left female breast: Secondary | ICD-10-CM

## 2019-09-04 MED ORDER — EXEMESTANE 25 MG PO TABS: 25.0000 mg | ORAL_TABLET | Freq: Every day | ORAL | 0 refills | Status: DC

## 2019-09-04 NOTE — Assessment & Plan Note (Signed)
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 Systemic chemotherapy with dose dense Adriamycin and Cytoxan 4 followed by Abraxane weekly 12 started 12/29/2015 and completed 05/10/2016 2. Adjuvant radiation therapy started 06/14/2016 and completed 07/25/2016 3. Adjuvant antiestrogen therapy With anastrozole 1 mg by mouth daily started 08/09/2016 switched to Tamoxifen 06/03/18 due to myalgias then switched to letrozole, switched to anastrozole 06/04/2019  Anastrozole toxicities:     Chemo induced peripheral neuropathy:Stable Surveillance:  Mammogram 12/18/18 Postsurgical changes, benign, breast density B Bone Density: 12/13/17: Osteoporosis T score -2.5 Rec Bisphosphonate therapy  RTC in  6 months for follow-up

## 2019-11-19 ENCOUNTER — Other Ambulatory Visit: Payer: Self-pay | Admitting: Hematology and Oncology

## 2019-11-19 DIAGNOSIS — Z1231 Encounter for screening mammogram for malignant neoplasm of breast: Secondary | ICD-10-CM

## 2019-11-25 ENCOUNTER — Other Ambulatory Visit: Payer: Self-pay | Admitting: Hematology and Oncology

## 2019-12-01 ENCOUNTER — Telehealth: Payer: Self-pay | Admitting: Hematology and Oncology

## 2019-12-01 DIAGNOSIS — E559 Vitamin D deficiency, unspecified: Secondary | ICD-10-CM | POA: Diagnosis not present

## 2019-12-01 DIAGNOSIS — Z0000 Encounter for general adult medical examination without abnormal findings: Secondary | ICD-10-CM | POA: Diagnosis not present

## 2019-12-01 DIAGNOSIS — Z23 Encounter for immunization: Secondary | ICD-10-CM | POA: Diagnosis not present

## 2019-12-01 DIAGNOSIS — Z1211 Encounter for screening for malignant neoplasm of colon: Secondary | ICD-10-CM | POA: Diagnosis not present

## 2019-12-01 NOTE — Telephone Encounter (Signed)
Scheduled apt per 1/27 sch message - mailed letter with appt date and time

## 2019-12-03 DIAGNOSIS — E559 Vitamin D deficiency, unspecified: Secondary | ICD-10-CM | POA: Diagnosis not present

## 2019-12-18 ENCOUNTER — Other Ambulatory Visit: Payer: Self-pay | Admitting: Hematology and Oncology

## 2019-12-18 DIAGNOSIS — Z853 Personal history of malignant neoplasm of breast: Secondary | ICD-10-CM

## 2019-12-22 ENCOUNTER — Ambulatory Visit
Admission: RE | Admit: 2019-12-22 | Discharge: 2019-12-22 | Disposition: A | Discharge status: Home / Self Care | Payer: BC Managed Care – PPO | Source: Ambulatory Visit | Attending: Hematology and Oncology | Admitting: Hematology and Oncology

## 2019-12-22 ENCOUNTER — Other Ambulatory Visit: Payer: Self-pay

## 2019-12-22 DIAGNOSIS — Z853 Personal history of malignant neoplasm of breast: Secondary | ICD-10-CM

## 2020-02-02 DIAGNOSIS — D72819 Decreased white blood cell count, unspecified: Secondary | ICD-10-CM | POA: Diagnosis not present

## 2020-02-12 DIAGNOSIS — Z23 Encounter for immunization: Secondary | ICD-10-CM | POA: Diagnosis not present

## 2020-02-20 ENCOUNTER — Other Ambulatory Visit: Payer: Self-pay | Admitting: Hematology and Oncology

## 2020-02-29 NOTE — Progress Notes (Signed)
Patient Care Team: Kristopher Glee., MD as PCP - General (Internal Medicine)  DIAGNOSIS:    ICD-10-CM   1. Malignant neoplasm of upper-outer quadrant of left breast in female, estrogen receptor positive ([place])  C50.412    Z17.0     SUMMARY OF ONCOLOGIC HISTORY: Oncology History  Breast cancer of upper-outer quadrant of left female breast ([place])  10/19/2015 Mammogram   Screening mammogram revealed left breast asymmetry 1.1 x 0.8 x 1 cm, T1 cN0 stage IA clinical stage   11/03/2015 Initial Diagnosis   Left breast biopsy 11:30 position: Invasive ductal carcinoma with DCIS, grade 2, ER 100%, PR 30%, Ki-67 90%, HER-2 negative ratio 1.44   11/22/2015 Surgery   Lt Lumpectomy: IDC 1.5 cm, 0/2 LN, grade 2, with Int Grade DCIS, ER 100%, PR 30%, Her 2 Neg Ratio 1.91, T1cN0 (stage 1A)Oncotype DX score 37, 10 year ROR 25%   12/29/2015 - 05/10/2016 Chemotherapy   Adjuvant chemotherapy with dose dense Adriamycin Cytoxan 4 followed by Abraxane weekly 10 stopped early due to neuropathy   06/14/2016 - 07/25/2016 Radiation Therapy   Adjuvant radiation therapy   08/09/2016 -  Anti-estrogen oral therapy   Anastrozole 1 mg by mouth daily switched to Tamoxifen on 06/03/18 due to Muscle pains, switched to letrozole, once again switched back to anastrozole 06/04/2019 (1/2 tab), switched to exemestane 09/04/19     CHIEF COMPLIANT: Follow-up of left breast cancer on exemestane   INTERVAL HISTORY: Heather [name] is a 64 y.o. with above-mentioned history of left breast cancer treated with lumpectomy, adjuvant chemotherapy, radiation, and who is currently on anti-estrogen therapy withexemestane. Mammogram on 12/22/19 showed no evidence of malignancy bilaterally. She presents to the clinic today for follow-up.   ALLERGIES:  has No Known Allergies.  MEDICATIONS:  Current Outpatient Medications  Medication Sig Dispense Refill  . anastrozole (ARIMIDEX) 1 MG tablet Take 1 tablet (1 mg total) by mouth daily. 90  tablet 3  . Ascorbic Acid (VITAMIN C) 1000 MG tablet Take 1,000 mg by mouth daily.    Marland Kitchen b complex vitamins tablet Take 1 tablet by mouth daily.    . calcium carbonate (OS-CAL) 600 MG TABS Take 600 mg by mouth 2 (two) times daily with a meal.    . cholecalciferol (VITAMIN D) 1000 UNITS tablet Take 1,000 Units by mouth daily.    Marland Kitchen exemestane (AROMASIN) 25 MG tablet TAKE ONE TABLET BY MOUTH DAILY AFTER BREAKFAST  30 tablet 0  . metroNIDAZOLE (METROGEL VAGINAL) 0.75 % vaginal gel Place one applicator in vagina daily for 5 nights  Avoid alcohol while taking 70 g 0  . Misc Natural Products (GLUCOSAMINE CHONDROITIN ADV PO) Take by mouth daily.    . Omega-3 Fatty Acids (OMEGA-3 FISH OIL) 500 MG CAPS Take 1 capsule by mouth daily.    Marland Kitchen VITAMIN E COMPLEX PO Take by mouth daily.      No current facility-administered medications for this visit.    PHYSICAL EXAMINATION: ECOG PERFORMANCE STATUS: 1 - Symptomatic but completely ambulatory  Vitals:   03/01/20 1440  BP: 134/85  Pulse: 96  Resp: 18  Temp: 98.7 F (37.1 C)  SpO2: 99%   Filed Weights   03/01/20 1440  Weight: 139 lb (63 kg)    BREAST: No palpable masses or nodules in either right or left breasts. No palpable axillary supraclavicular or infraclavicular adenopathy no breast tenderness or nipple discharge. (exam performed in the presence of a chaperone)  LABORATORY DATA:  I have reviewed the  data as listed CMP Latest Ref Rng & Units 08/09/2016 05/10/2016 05/03/2016  Glucose 70 - 140 mg/dl 82 98 85  BUN 7.0 - 26.0 mg/dL 9.6 11.8 8.3  Creatinine 0.6 - 1.1 mg/dL 0.7 0.7 0.7  Sodium 136 - 145 mEq/L 142 140 142  Potassium 3.5 - 5.1 mEq/L 3.9 3.9 4.7  CO2 22 - 29 mEq/L _0 Calcium 8.4 - 10.4 mg/dL 9.5 9.6 10.0  Total Protein 6.4 - 8.3 g/dL 7.2 7.1 7.3  Total Bilirubin 0.20 - 1.20 mg/dL 0.48 <0.30 <0.30  Alkaline Phos 40 - 150 U/L 95 100 106  AST 5 - 34 U/L 31 35(H) 41(H)  ALT 0 - 55 U/L 34 31 37    Lab Results  Component Value  Date   WBC 4.3 03/05/2018   HGB 13.8 03/05/2018   HCT 40.3 03/05/2018   MCV 91.2 03/05/2018   PLT 189 03/05/2018   NEUTROABS 2.4 08/09/2016    ASSESSMENT & PLAN:  Breast cancer of upper-outer quadrant of left female breast ([place]) Lt Lumpectomy 11/22/15: 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 Systemic chemotherapy with dose dense Adriamycin and Cytoxan 4 followed by Abraxane weekly 12 started 12/29/2015 and completed 05/10/2016 2. Adjuvant radiation therapy started 06/14/2016 and completed 07/25/2016 3. Adjuvant antiestrogen therapy With anastrozole 1 mg by mouth daily started 10/11/2017switched to Tamoxifen 06/03/18 due to myalgiasthen switched to letrozole, switched to anastrozole 06/04/2019  Anastrozole toxicities: Could not tolerate it. Switched to Exemestane currently taking half a tablet daily Exemestane Toxicities: Muscle aches and pains which seem to be tolerable  Chemo induced peripheral neuropathy:Stable Surveillance:  Mammogram 2/19/20Postsurgical changes, benign, breast density B Bone Density: 12/13/17: Osteoporosis T score -2.5 Rec Bisphosphonate therapy but patient is not taking it.  She takes calcium and vitamin D. She will need a new bone density.  RTC in 1 year    No orders of the defined types were placed in this encounter.  The patient has a good understanding of the overall plan. she agrees with it. she will call with any problems that may develop before the next visit here.  Total time spent: 20 mins including face to face time and time spent for planning, charting and coordination of care  Nicholas Lose, MD 03/01/2020  I, Cloyde Reams Dorshimer, am acting as scribe for Dr. Nicholas Lose.  I have reviewed the above documentation for accuracy and completeness, and I agree with the above.

## 2020-03-01 ENCOUNTER — Other Ambulatory Visit: Payer: Self-pay

## 2020-03-01 ENCOUNTER — Inpatient Hospital Stay: Payer: BC Managed Care – PPO | Attending: Hematology and Oncology | Admitting: Hematology and Oncology

## 2020-03-01 DIAGNOSIS — Z170 Estrogen receptor positive status [ER+]: Secondary | ICD-10-CM | POA: Insufficient documentation

## 2020-03-01 DIAGNOSIS — Z79811 Long term (current) use of aromatase inhibitors: Secondary | ICD-10-CM | POA: Insufficient documentation

## 2020-03-01 DIAGNOSIS — Z79899 Other long term (current) drug therapy: Secondary | ICD-10-CM | POA: Diagnosis not present

## 2020-03-01 DIAGNOSIS — C50412 Malignant neoplasm of upper-outer quadrant of left female breast: Secondary | ICD-10-CM | POA: Diagnosis not present

## 2020-03-01 DIAGNOSIS — Z923 Personal history of irradiation: Secondary | ICD-10-CM | POA: Diagnosis not present

## 2020-03-01 DIAGNOSIS — Z9221 Personal history of antineoplastic chemotherapy: Secondary | ICD-10-CM | POA: Diagnosis not present

## 2020-03-01 DIAGNOSIS — G620 Drug-induced polyneuropathy: Secondary | ICD-10-CM | POA: Insufficient documentation

## 2020-03-01 DIAGNOSIS — T451X5A Adverse effect of antineoplastic and immunosuppressive drugs, initial encounter: Secondary | ICD-10-CM | POA: Diagnosis not present

## 2020-03-01 MED ORDER — EXEMESTANE 25 MG PO TABS: 25.0000 mg | ORAL_TABLET | Freq: Every day | ORAL | 3 refills | Status: AC

## 2020-03-01 MED ORDER — ANASTROZOLE 1 MG PO TABS: 1.0000 mg | ORAL_TABLET | Freq: Every day | ORAL | 3 refills | Status: DC

## 2020-03-01 NOTE — Assessment & Plan Note (Signed)
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 Systemic chemotherapy with dose dense Adriamycin and Cytoxan 4 followed by Abraxane weekly 12 started 12/29/2015 and completed 05/10/2016 2. Adjuvant radiation therapy started 06/14/2016 and completed 07/25/2016 3. Adjuvant antiestrogen therapy With anastrozole 1 mg by mouth daily started 10/11/2017switched to Tamoxifen 06/03/18 due to myalgiasthen switched to letrozole, switched to anastrozole 06/04/2019  Anastrozole toxicities: Could not tolerate it. Switched to Exemestane Exemestane Toxicities:   Chemo induced peripheral neuropathy:Stable Surveillance:  Mammogram 2/19/20Postsurgical changes, benign, breast density B Bone Density: 12/13/17: Osteoporosis T score -2.5 Rec Bisphosphonate therapy  RTC in 1 year

## 2020-03-02 ENCOUNTER — Telehealth: Payer: Self-pay | Admitting: Hematology and Oncology

## 2020-03-02 NOTE — Telephone Encounter (Signed)
No 5/3 los. No changes made to pt's schedule.

## 2020-03-04 DIAGNOSIS — Z23 Encounter for immunization: Secondary | ICD-10-CM | POA: Diagnosis not present

## 2020-03-04 DIAGNOSIS — K5730 Diverticulosis of large intestine without perforation or abscess without bleeding: Secondary | ICD-10-CM | POA: Diagnosis not present

## 2020-03-04 DIAGNOSIS — L290 Pruritus ani: Secondary | ICD-10-CM | POA: Diagnosis not present

## 2020-03-04 DIAGNOSIS — K600 Acute anal fissure: Secondary | ICD-10-CM | POA: Diagnosis not present

## 2020-06-02 ENCOUNTER — Ambulatory Visit: Payer: BC Managed Care – PPO | Admitting: Obstetrics & Gynecology

## 2020-06-02 ENCOUNTER — Other Ambulatory Visit: Payer: Self-pay

## 2020-06-02 ENCOUNTER — Encounter: Payer: Self-pay | Admitting: Obstetrics & Gynecology

## 2020-06-02 VITALS — BP 128/86 | Ht 59.0 in | Wt 141.0 lb

## 2020-06-02 DIAGNOSIS — Z01419 Encounter for gynecological examination (general) (routine) without abnormal findings: Secondary | ICD-10-CM | POA: Diagnosis not present

## 2020-06-02 DIAGNOSIS — Z170 Estrogen receptor positive status [ER+]: Secondary | ICD-10-CM

## 2020-06-02 DIAGNOSIS — C50412 Malignant neoplasm of upper-outer quadrant of left female breast: Secondary | ICD-10-CM

## 2020-06-02 DIAGNOSIS — Z90710 Acquired absence of both cervix and uterus: Secondary | ICD-10-CM

## 2020-06-02 DIAGNOSIS — Z1272 Encounter for screening for malignant neoplasm of vagina: Secondary | ICD-10-CM

## 2020-06-02 DIAGNOSIS — M8580 Other specified disorders of bone density and structure, unspecified site: Secondary | ICD-10-CM

## 2020-06-02 DIAGNOSIS — Z780 Asymptomatic menopausal state: Secondary | ICD-10-CM | POA: Diagnosis not present

## 2020-06-02 DIAGNOSIS — Z9079 Acquired absence of other genital organ(s): Secondary | ICD-10-CM

## 2020-06-02 DIAGNOSIS — Z90722 Acquired absence of ovaries, bilateral: Secondary | ICD-10-CM

## 2020-06-02 DIAGNOSIS — Z853 Personal history of malignant neoplasm of breast: Secondary | ICD-10-CM | POA: Diagnosis not present

## 2020-06-02 NOTE — Progress Notes (Signed)
Heather [name] [facility] [birth year] [id number]   History:    64 y.o. Married.  2 daughters 69 and 59 yo.  3 grand-children.  RP:  Established patient presenting for annual gyn exam   HPI:  S/P BSO in 2019, previous Total Hysterectomy.  Menopause, well on no HRT.  Left Breast Ca on Anastrazole x 07/2016.  No pelvic pain.  Urine and BMs wnl.  Fit and eating well. BMI 28.48.  Health labs with Fam MD.  Past medical history,surgical history, family history and social history were all reviewed and documented in the EPIC chart.  Gynecologic History No LMP recorded. Patient has had a hysterectomy.  Obstetric History OB History  Gravida Para Term Preterm AB Living  2 2 2     2   SAB TAB Ectopic Multiple Live Births          2    # Outcome Date GA Lbr Len/2nd Weight Sex Delivery Anes PTL Lv  2 Term     F CS-Unspec  N LIV  1 Term     F Vag-Spont  N LIV     ROS: A ROS was performed and pertinent positives and negatives are included in the history.  GENERAL: No fevers or chills. HEENT: No change in vision, no earache, sore throat or sinus congestion. NECK: No pain or stiffness. CARDIOVASCULAR: No chest pain or pressure. No palpitations. PULMONARY: No shortness of breath, cough or wheeze. GASTROINTESTINAL: No abdominal pain, nausea, vomiting or diarrhea, melena or bright red blood per rectum. GENITOURINARY: No urinary frequency, urgency, hesitancy or dysuria. MUSCULOSKELETAL: No joint or muscle pain, no back pain, no recent trauma. DERMATOLOGIC: No rash, no itching, no lesions. ENDOCRINE: No polyuria, polydipsia, no heat or cold intolerance. No recent change in weight. HEMATOLOGICAL: No anemia or easy bruising or bleeding. NEUROLOGIC: No headache, seizures, numbness, tingling or weakness. PSYCHIATRIC: No depression, no loss of interest in normal activity or change in sleep pattern.     Exam:   BP 128/86   Ht 4\' 11"  (1.499 m)   Wt 141 lb (64 kg)   BMI 28.48 kg/m   Body mass index is 28.48  kg/m.  General appearance : Well developed well nourished female. No acute distress HEENT: Eyes: no retinal hemorrhage or exudates,  Neck supple, trachea midline, no carotid bruits, no thyroidmegaly Lungs: Clear to auscultation, no rhonchi or wheezes, or rib retractions  Heart: Regular rate and rhythm, no murmurs or gallops Breast:Examined in sitting and supine position were symmetrical in appearance, no palpable masses or tenderness,  no skin retraction, no nipple inversion, no nipple discharge, no skin discoloration, no axillary or supraclavicular lymphadenopathy Abdomen: no palpable masses or tenderness, no rebound or guarding Extremities: no edema or skin discoloration or tenderness  Pelvic: Vulva: Normal             Vagina: No gross lesions or discharge.  Pap reflex done.  Cervix/Uterus absent  Adnexa  Without masses or tenderness  Anus: Normal   Assessment/Plan:  64 y.o. female for annual exam   1. Encounter for Papanicolaou smear of vagina as part of routine gynecological examination Gynecologic exam status post total hysterectomy with BSO.  Pap reflex done on the vaginal vault.  Breast exam normal.  Screening mammogram February 2021 was negative.  Colonoscopy 2015.  Health labs with family physician.  2. H/O total hysterectomy with bilateral salpingo-oophorectomy (BSO)  3. Postmenopause Well on no hormone replacement therapy.  4. Osteopenia, unspecified location Last bone density February  2019 with osteopenia T score -2.3 at the lumbar vertebrae.  We will repeat a bone density now.  Vitamin D supplements, calcium intake of 1200 to 1500 mg daily and regular weightbearing physical activities to continue. - DG Bone Density; Future  5. Malignant neoplasm of upper-outer quadrant of left breast in female, estrogen receptor positive ([place]) On anastrozole since 2017.  Princess Bruins MD, 2:23 PM 06/02/2020

## 2020-06-03 LAB — PAP IG W/ RFLX HPV ASCU

## 2020-11-22 ENCOUNTER — Other Ambulatory Visit: Payer: Self-pay | Admitting: Hematology and Oncology

## 2020-11-22 DIAGNOSIS — C50412 Malignant neoplasm of upper-outer quadrant of left female breast: Secondary | ICD-10-CM

## 2020-11-22 DIAGNOSIS — Z170 Estrogen receptor positive status [ER+]: Secondary | ICD-10-CM

## 2021-01-06 ENCOUNTER — Ambulatory Visit
Admission: RE | Admit: 2021-01-06 | Discharge: 2021-01-06 | Disposition: A | Discharge status: Home / Self Care | Payer: BC Managed Care – PPO | Source: Ambulatory Visit | Attending: Hematology and Oncology | Admitting: Hematology and Oncology

## 2021-01-06 ENCOUNTER — Other Ambulatory Visit: Payer: Self-pay

## 2021-01-06 DIAGNOSIS — C50412 Malignant neoplasm of upper-outer quadrant of left female breast: Secondary | ICD-10-CM

## 2021-01-06 DIAGNOSIS — Z170 Estrogen receptor positive status [ER+]: Secondary | ICD-10-CM

## 2021-01-06 HISTORY — DX: Personal history of irradiation: Z92.3

## 2021-01-06 HISTORY — DX: Personal history of antineoplastic chemotherapy: Z92.21

## 2021-03-22 ENCOUNTER — Other Ambulatory Visit: Payer: Self-pay | Admitting: *Deleted

## 2021-03-22 DIAGNOSIS — Z170 Estrogen receptor positive status [ER+]: Secondary | ICD-10-CM

## 2021-03-22 DIAGNOSIS — C50412 Malignant neoplasm of upper-outer quadrant of left female breast: Secondary | ICD-10-CM

## 2021-03-22 NOTE — Progress Notes (Signed)
Pt requesting bone density report be sent to [facility] [phone]).  Orders received by MD and RN successfully faxed orders to number provided by pt.

## 2021-03-23 ENCOUNTER — Telehealth: Payer: Self-pay | Admitting: Hematology and Oncology

## 2021-03-23 NOTE — Telephone Encounter (Signed)
Scheduled appt per 5/24 sch msg. Pt aware.

## 2021-04-14 ENCOUNTER — Telehealth: Payer: Self-pay | Admitting: Hematology and Oncology

## 2021-04-14 NOTE — Telephone Encounter (Signed)
R/s appt per 6/15 sch msg. Pt aware.

## 2021-05-09 ENCOUNTER — Ambulatory Visit: Payer: BC Managed Care – PPO | Admitting: Hematology and Oncology

## 2021-05-19 NOTE — Progress Notes (Signed)
Patient Care Team: Kristopher Glee., MD as PCP - General (Internal Medicine)  DIAGNOSIS:    ICD-10-CM   1. Malignant neoplasm of upper-outer quadrant of left breast in female, estrogen receptor positive ([place])  C50.412    Z17.0       SUMMARY OF ONCOLOGIC HISTORY: Oncology History  Breast cancer of upper-outer quadrant of left female breast ([place])  10/19/2015 Mammogram   Screening mammogram revealed left breast asymmetry 1.1 x 0.8 x 1 cm, T1 cN0 stage IA clinical stage    11/03/2015 Initial Diagnosis   Left breast biopsy 11:30 position: Invasive ductal carcinoma with DCIS, grade 2, ER 100%, PR 30%, Ki-67 90%, HER-2 negative ratio 1.44    11/22/2015 Surgery   Lt Lumpectomy: IDC 1.5 cm, 0/2 LN, grade 2, with Int Grade DCIS, ER 100%, PR 30%, Her 2 Neg Ratio 1.91, T1cN0 (stage 1A)Oncotype DX score 37, 10 year ROR 25%    12/29/2015 - 05/10/2016 Chemotherapy   Adjuvant chemotherapy with dose dense Adriamycin Cytoxan 4 followed by Abraxane weekly 10 stopped early due to neuropathy    06/14/2016 - 07/25/2016 Radiation Therapy   Adjuvant radiation therapy    08/09/2016 -  Anti-estrogen oral therapy   Anastrozole 1 mg by mouth daily switched to Tamoxifen on 06/03/18 due to Muscle pains, switched to letrozole, once again switched back to anastrozole 06/04/2019 (1/2 tab), switched to exemestane 09/04/19     CHIEF COMPLIANT: Follow-up of left breast cancer on exemestane   INTERVAL HISTORY: Heather [name] is a 65 y.o. with above-mentioned history of  left breast cancer treated with lumpectomy, adjuvant chemotherapy, radiation, and who is currently on anti-estrogen therapy with exemestane. Mammogram on 01/06/21 showed no evidence of malignancy bilaterally. She presents to the clinic today for follow-up.  She is taking exemestane on and off over the past year.  She may have taken it once or twice a week.  She tells me that it causes diffuse muscle aches and pains and she cannot have good quality  of life with it.  ALLERGIES:  has No Known Allergies.  MEDICATIONS:  Current Outpatient Medications  Medication Sig Dispense Refill   Ascorbic Acid (VITAMIN C) 1000 MG tablet Take 1,000 mg by mouth daily.     b complex vitamins tablet Take 1 tablet by mouth daily.     calcium carbonate (OS-CAL) 600 MG TABS Take 600 mg by mouth 2 (two) times daily with a meal.     cholecalciferol (VITAMIN D) 1000 UNITS tablet Take 1,000 Units by mouth daily.     exemestane (AROMASIN) 25 MG tablet Take 1 tablet (25 mg total) by mouth daily after breakfast. 90 tablet 3   Misc Natural Products (GLUCOSAMINE CHONDROITIN ADV PO) Take by mouth daily.     Omega-3 Fatty Acids (OMEGA-3 FISH OIL) 500 MG CAPS Take 1 capsule by mouth daily.     VITAMIN E COMPLEX PO Take by mouth daily.      No current facility-administered medications for this visit.    PHYSICAL EXAMINATION: ECOG PERFORMANCE STATUS: 1 - Symptomatic but completely ambulatory  Vitals:   05/20/21 1344  BP: 111/69  Pulse: 93  Resp: 18  Temp: 97.7 F (36.5 C)  SpO2: 98%   Filed Weights   05/20/21 1344  Weight: 137 lb 4.8 oz (62.3 kg)    BREAST: No palpable masses or nodules in either right or left breasts. No palpable axillary supraclavicular or infraclavicular adenopathy no breast tenderness or nipple discharge. (exam performed in the  presence of a chaperone)  LABORATORY DATA:  I have reviewed the data as listed CMP Latest Ref Rng & Units 08/09/2016 05/10/2016 05/03/2016  Glucose 70 - 140 mg/dl 82 98 85  BUN 7.0 - 26.0 mg/dL 9.6 11.8 8.3  Creatinine 0.6 - 1.1 mg/dL 0.7 0.7 0.7  Sodium 136 - 145 mEq/L 142 140 142  Potassium 3.5 - 5.1 mEq/L 3.9 3.9 4.7  CO2 22 - 29 mEq/L _0 Calcium 8.4 - 10.4 mg/dL 9.5 9.6 10.0  Total Protein 6.4 - 8.3 g/dL 7.2 7.1 7.3  Total Bilirubin 0.20 - 1.20 mg/dL 0.48 <0.30 <0.30  Alkaline Phos 40 - 150 U/L 95 100 106  AST 5 - 34 U/L 31 35(H) 41(H)  ALT 0 - 55 U/L 34 31 37    Lab Results  Component  Value Date   WBC 4.3 03/05/2018   HGB 13.8 03/05/2018   HCT 40.3 03/05/2018   MCV 91.2 03/05/2018   PLT 189 03/05/2018   NEUTROABS 2.4 08/09/2016    ASSESSMENT & PLAN:  Breast cancer of upper-outer quadrant of left female breast ([place]) Lt Lumpectomy 11/22/15: IDC 1.5 cm, 0/2 LN, grade 2, with Int Grade DCIS, ER 100%, PR 30%, Her 2 Neg Ratio 1.91, T1cN0 (stage 1A), Oncotype DX recurrence score 37, 25% 10 year risk of recurrence   Treatment summary:   1. Systemic chemotherapy with dose dense Adriamycin and Cytoxan 4 followed by Abraxane weekly 12  started 12/29/2015 and completed 05/10/2016 2. Adjuvant radiation therapy started 06/14/2016 and completed 07/25/2016 3. Adjuvant antiestrogen therapy With anastrozole 1 mg by mouth daily started 08/09/2016 switched to Tamoxifen 06/03/18 due to myalgias then switched to letrozole, switched to anastrozole 06/04/2019 and then to exemestane    Exemestane Toxicities: Muscle aches and pains which seem to be tolerable She will finish exemestane by December 2022   Chemo induced peripheral neuropathy: Stable Surveillance: Mammogram 01/06/2021 postsurgical changes, benign, breast density B Breast exam 05/20/2021: Benign Bone Density: 12/13/17: Osteoporosis T score -2.5 Rec Bisphosphonate therapy but patient is not taking it.  She takes calcium and vitamin D. She will need a new bone density.   RTC on an as-needed basis.    No orders of the defined types were placed in this encounter.  The patient has a good understanding of the overall plan. she agrees with it. she will call with any problems that may develop before the next visit here.  Total time spent: 20 mins including face to face time and time spent for planning, charting and coordination of care  Rulon Eisenmenger, MD, MPH 05/20/2021  I, Thana Ates, am acting as scribe for Dr. Nicholas Lose.  I have reviewed the above documentation for accuracy and completeness, and I agree with the  above.

## 2021-05-20 ENCOUNTER — Inpatient Hospital Stay: Payer: BC Managed Care – PPO | Attending: Hematology and Oncology | Admitting: Hematology and Oncology

## 2021-05-20 ENCOUNTER — Other Ambulatory Visit: Payer: Self-pay

## 2021-05-20 DIAGNOSIS — Z79811 Long term (current) use of aromatase inhibitors: Secondary | ICD-10-CM | POA: Insufficient documentation

## 2021-05-20 DIAGNOSIS — Z923 Personal history of irradiation: Secondary | ICD-10-CM | POA: Diagnosis not present

## 2021-05-20 DIAGNOSIS — G620 Drug-induced polyneuropathy: Secondary | ICD-10-CM | POA: Insufficient documentation

## 2021-05-20 DIAGNOSIS — Z79899 Other long term (current) drug therapy: Secondary | ICD-10-CM | POA: Insufficient documentation

## 2021-05-20 DIAGNOSIS — M810 Age-related osteoporosis without current pathological fracture: Secondary | ICD-10-CM | POA: Diagnosis not present

## 2021-05-20 DIAGNOSIS — C50412 Malignant neoplasm of upper-outer quadrant of left female breast: Secondary | ICD-10-CM | POA: Insufficient documentation

## 2021-05-20 DIAGNOSIS — Z9221 Personal history of antineoplastic chemotherapy: Secondary | ICD-10-CM | POA: Diagnosis not present

## 2021-05-20 DIAGNOSIS — Z170 Estrogen receptor positive status [ER+]: Secondary | ICD-10-CM | POA: Insufficient documentation

## 2021-05-20 NOTE — Assessment & Plan Note (Signed)
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 Systemic chemotherapy with dose dense Adriamycin and Cytoxan 4 followed by Abraxane weekly 12 started 12/29/2015 and completed 05/10/2016 2. Adjuvant radiation therapy started 06/14/2016 and completed 07/25/2016 3. Adjuvant antiestrogen therapy With anastrozole 1 mg by mouth daily started 10/11/2017switched to Tamoxifen 06/03/18 due to myalgiasthen switched to letrozole, switched to anastrozole 06/04/2019  Anastrozoletoxicities:Could not tolerate it. Switched to Exemestane currently taking half a tablet daily Exemestane Toxicities: Muscle aches and pains which seem to be tolerable  Chemo induced peripheral neuropathy:Stable Surveillance: Mammogram 3/10/2022postsurgical changes, benign, breast density B Breast exam 05/20/2021: Benign Bone Density: 12/13/17: Osteoporosis T score -2.5 Rec Bisphosphonate therapy but patient is not taking it.  She takes calcium and vitamin D. She will need a new bone density.  RTC in 1 year

## 2021-12-28 ENCOUNTER — Other Ambulatory Visit: Payer: Self-pay | Admitting: Obstetrics & Gynecology

## 2021-12-28 DIAGNOSIS — Z1231 Encounter for screening mammogram for malignant neoplasm of breast: Secondary | ICD-10-CM

## 2022-01-09 ENCOUNTER — Ambulatory Visit
Admission: RE | Admit: 2022-01-09 | Discharge: 2022-01-09 | Disposition: A | Discharge status: Home / Self Care | Payer: BC Managed Care – PPO | Source: Ambulatory Visit | Attending: Obstetrics & Gynecology | Admitting: Obstetrics & Gynecology

## 2022-01-09 DIAGNOSIS — Z1231 Encounter for screening mammogram for malignant neoplasm of breast: Secondary | ICD-10-CM

## 2022-02-27 ENCOUNTER — Encounter: Payer: Self-pay | Admitting: Obstetrics & Gynecology

## 2022-02-27 ENCOUNTER — Ambulatory Visit (INDEPENDENT_AMBULATORY_CARE_PROVIDER_SITE_OTHER): Payer: BC Managed Care – PPO | Admitting: Obstetrics & Gynecology

## 2022-02-27 VITALS — BP 124/80 | HR 86 | Ht 59.0 in | Wt 140.0 lb

## 2022-02-27 DIAGNOSIS — Z90710 Acquired absence of both cervix and uterus: Secondary | ICD-10-CM | POA: Diagnosis not present

## 2022-02-27 DIAGNOSIS — Z780 Asymptomatic menopausal state: Secondary | ICD-10-CM | POA: Diagnosis not present

## 2022-02-27 DIAGNOSIS — M8580 Other specified disorders of bone density and structure, unspecified site: Secondary | ICD-10-CM | POA: Diagnosis not present

## 2022-02-27 DIAGNOSIS — Z170 Estrogen receptor positive status [ER+]: Secondary | ICD-10-CM

## 2022-02-27 DIAGNOSIS — Z90722 Acquired absence of ovaries, bilateral: Secondary | ICD-10-CM

## 2022-02-27 DIAGNOSIS — Z01419 Encounter for gynecological examination (general) (routine) without abnormal findings: Secondary | ICD-10-CM | POA: Diagnosis not present

## 2022-02-27 DIAGNOSIS — C50412 Malignant neoplasm of upper-outer quadrant of left female breast: Secondary | ICD-10-CM

## 2022-02-27 DIAGNOSIS — Z9079 Acquired absence of other genital organ(s): Secondary | ICD-10-CM

## 2022-02-27 NOTE — Progress Notes (Signed)
? ? ?Heather [name] [facility] September 19, 1956 [id number] ? ? ?History:    66 y.o.  Married.  2 daughters 67 and 35 yo.  3 grand-children. ?  ?RP:  Established patient presenting for annual gyn exam  ?  ?HPI:  S/P BSO in 2019, previous Total Hysterectomy.  Postmenopause, well on no HRT.  Pap Neg 05/2020.  No h/o abnormal Pap.  Repeat Pap at 5 years. Left Breast Ca, stopped Anastrazole. Mammo Neg 12/2021.  No pelvic pain.  Urine and BMs wnl.  [place] 2015. Fit and eating well.  BD Osteopenia in 2022 per patient.  BMI 28.28. Health labs with Fam MD. ? ? ?Past medical history,surgical history, family history and social history were all reviewed and documented in the EPIC chart. ? ?Gynecologic History ?No LMP recorded. Patient has had a hysterectomy. ? ?Obstetric History ?OB History  ?Gravida Para Term Preterm AB Living  ?'2 2 2     2  '$ ?SAB IAB Ectopic Multiple Live Births  ?        2  ?  ?# Outcome Date GA Lbr Len/2nd Weight Sex Delivery Anes PTL Lv  ?2 Term     F CS-Unspec  N LIV  ?1 Term     F Vag-Spont  N LIV  ? ? ? ?ROS: A ROS was performed and pertinent positives and negatives are included in the history. ?GENERAL: No fevers or chills. HEENT: No change in vision, no earache, sore throat or sinus congestion. NECK: No pain or stiffness. CARDIOVASCULAR: No chest pain or pressure. No palpitations. PULMONARY: No shortness of breath, cough or wheeze. GASTROINTESTINAL: No abdominal pain, nausea, vomiting or diarrhea, melena or bright red blood per rectum. GENITOURINARY: No urinary frequency, urgency, hesitancy or dysuria. MUSCULOSKELETAL: No joint or muscle pain, no back pain, no recent trauma. DERMATOLOGIC: No rash, no itching, no lesions. ENDOCRINE: No polyuria, polydipsia, no heat or cold intolerance. No recent change in weight. HEMATOLOGICAL: No anemia or easy bruising or bleeding. NEUROLOGIC: No headache, seizures, numbness, tingling or weakness. PSYCHIATRIC: No depression, no loss of interest in normal activity or change in  sleep pattern.  ?  ? ?Exam: ? ? ?BP 124/80   Pulse 86   Ht '4\' 11"'$  (1.499 m)   Wt 140 lb (63.5 kg)   SpO2 99%   BMI 28.28 kg/m?  ? ?Body mass index is 28.28 kg/m?. ? ?General appearance : Well developed well nourished female. No acute distress ?HEENT: Eyes: no retinal hemorrhage or exudates,  Neck supple, trachea midline, no carotid bruits, no thyroidmegaly ?Lungs: Clear to auscultation, no rhonchi or wheezes, or rib retractions  ?Heart: Regular rate and rhythm, no murmurs or gallops ?Breast:Examined in sitting and supine position were symmetrical in appearance, no palpable masses or tenderness,  no skin retraction, no nipple inversion, no nipple discharge, no skin discoloration, no axillary or supraclavicular lymphadenopathy ?Abdomen: no palpable masses or tenderness, no rebound or guarding ?Extremities: no edema or skin discoloration or tenderness ? ?Pelvic: Vulva: Normal ?            Vagina: No gross lesions or discharge ? Cervix/Uterus absent ? Adnexa  Without masses or tenderness ? Anus: Normal ? ? ?Assessment/Plan:  66 y.o. female for annual exam  ? ?1. Well female exam with routine gynecological exam ?S/P BSO in 2019, previous Total Hysterectomy.  Postmenopause, well on no HRT.  Pap Neg 05/2020.  No h/o abnormal Pap.  Repeat Pap at 5 years. Left Breast Ca, stopped Anastrazole. Mammo Neg 12/2021.  No pelvic pain.  Urine and BMs wnl.  [place] 2015. Fit and eating well.  BD Osteopenia in 2022 per patient.  BMI 28.28. Health labs with Fam MD. ? ?2. H/O total hysterectomy with bilateral salpingo-oophorectomy (BSO) ? ?3. Postmenopause ?S/P BSO in 2019, previous Total Hysterectomy.  Postmenopause, well on no HRT.   ? ?4. Osteopenia, unspecified location ?BD Osteopenia in 2022 per patient.  Repeat BD in 2024. Fit and eating well.  Ca++ 1.5 g/d total, Vit D supplement. ? ?5. Malignant neoplasm of upper-outer quadrant of left breast in female, estrogen receptor positive ([place])  ?Stopped Anastrazol. ? ?Princess Bruins  MD, 1:58 PM 02/27/2022 ? ?  ?

## 2022-11-10 ENCOUNTER — Other Ambulatory Visit: Payer: Self-pay | Admitting: Obstetrics & Gynecology

## 2022-11-10 DIAGNOSIS — Z1231 Encounter for screening mammogram for malignant neoplasm of breast: Secondary | ICD-10-CM

## 2023-01-15 ENCOUNTER — Ambulatory Visit
Admission: RE | Admit: 2023-01-15 | Discharge: 2023-01-15 | Disposition: A | Discharge status: Home / Self Care | Payer: Medicare Other | Source: Ambulatory Visit | Attending: Obstetrics & Gynecology | Admitting: Obstetrics & Gynecology

## 2023-01-15 ENCOUNTER — Encounter: Payer: Self-pay | Admitting: Hematology and Oncology

## 2023-01-15 DIAGNOSIS — Z1231 Encounter for screening mammogram for malignant neoplasm of breast: Secondary | ICD-10-CM

## 2023-08-03 ENCOUNTER — Encounter: Payer: Self-pay | Admitting: Hematology and Oncology

## 2023-08-03 ENCOUNTER — Other Ambulatory Visit (HOSPITAL_BASED_OUTPATIENT_CLINIC_OR_DEPARTMENT_OTHER): Payer: Self-pay

## 2023-09-18 ENCOUNTER — Other Ambulatory Visit: Payer: Self-pay | Admitting: Internal Medicine

## 2023-09-18 DIAGNOSIS — Z1231 Encounter for screening mammogram for malignant neoplasm of breast: Secondary | ICD-10-CM

## 2024-01-16 ENCOUNTER — Ambulatory Visit: Payer: Medicare Other

## 2024-01-23 ENCOUNTER — Ambulatory Visit
Admission: RE | Admit: 2024-01-23 | Discharge: 2024-01-23 | Disposition: A | Discharge status: Home / Self Care | Source: Ambulatory Visit | Attending: Internal Medicine | Admitting: Internal Medicine

## 2024-01-23 DIAGNOSIS — Z1231 Encounter for screening mammogram for malignant neoplasm of breast: Secondary | ICD-10-CM
